# Patient Record
Sex: Male | Born: 1960 | Race: Black or African American | Hispanic: No | Smoking: Current every day smoker
Health system: Southern US, Community
[De-identification: ages and names within clinical notes are randomized; demographics above are authoritative.]

## PROBLEM LIST (undated history)

## (undated) DIAGNOSIS — F259 Schizoaffective disorder, unspecified: Secondary | ICD-10-CM

## (undated) DIAGNOSIS — R569 Unspecified convulsions: Secondary | ICD-10-CM

## (undated) DIAGNOSIS — I10 Essential (primary) hypertension: Secondary | ICD-10-CM

## (undated) DIAGNOSIS — J45909 Unspecified asthma, uncomplicated: Secondary | ICD-10-CM

## (undated) DIAGNOSIS — Z5189 Encounter for other specified aftercare: Secondary | ICD-10-CM

## (undated) DIAGNOSIS — M1990 Unspecified osteoarthritis, unspecified site: Secondary | ICD-10-CM

## (undated) DIAGNOSIS — E039 Hypothyroidism, unspecified: Secondary | ICD-10-CM

## (undated) DIAGNOSIS — Z87891 Personal history of nicotine dependence: Principal | ICD-10-CM

## (undated) DIAGNOSIS — E119 Type 2 diabetes mellitus without complications: Secondary | ICD-10-CM

## (undated) HISTORY — DX: Personal history of nicotine dependence: Z87.891

---

## 2005-04-14 ENCOUNTER — Emergency Department: Payer: Self-pay | Admitting: Emergency Medicine

## 2005-10-06 ENCOUNTER — Emergency Department: Payer: Self-pay | Admitting: Emergency Medicine

## 2007-11-16 ENCOUNTER — Emergency Department: Payer: Self-pay | Admitting: Internal Medicine

## 2007-11-16 DIAGNOSIS — E785 Hyperlipidemia, unspecified: Secondary | ICD-10-CM | POA: Insufficient documentation

## 2007-11-16 DIAGNOSIS — E119 Type 2 diabetes mellitus without complications: Secondary | ICD-10-CM

## 2007-11-16 DIAGNOSIS — I10 Essential (primary) hypertension: Secondary | ICD-10-CM | POA: Insufficient documentation

## 2007-11-30 ENCOUNTER — Emergency Department: Payer: Self-pay | Admitting: Emergency Medicine

## 2007-12-03 DIAGNOSIS — K649 Unspecified hemorrhoids: Secondary | ICD-10-CM | POA: Insufficient documentation

## 2008-09-28 ENCOUNTER — Inpatient Hospital Stay: Payer: Self-pay | Admitting: Internal Medicine

## 2008-10-03 ENCOUNTER — Emergency Department: Payer: Self-pay | Admitting: Internal Medicine

## 2008-10-11 ENCOUNTER — Ambulatory Visit: Payer: Self-pay | Admitting: Family Medicine

## 2008-10-11 IMAGING — CT CT HEAD WITHOUT CONTRAST
2 series · 15 of 30 positions shown, 19 images · non-contrast
Comparison: none

REASON FOR EXAM: altered mental status    eval for TIA or CVA
COMMENTS:

[Series 2: without · axial · non-contrast · 0.39mm/px · z∈[+248,+368]mm · 13 of 30 slices shown, 17 images]
[im 3/30  brain]
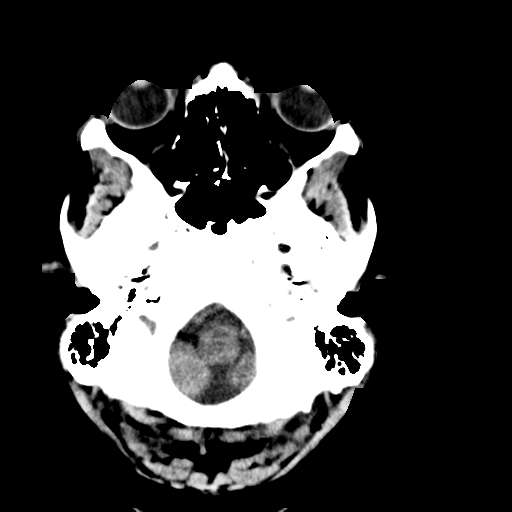
[im 3/30  bone]
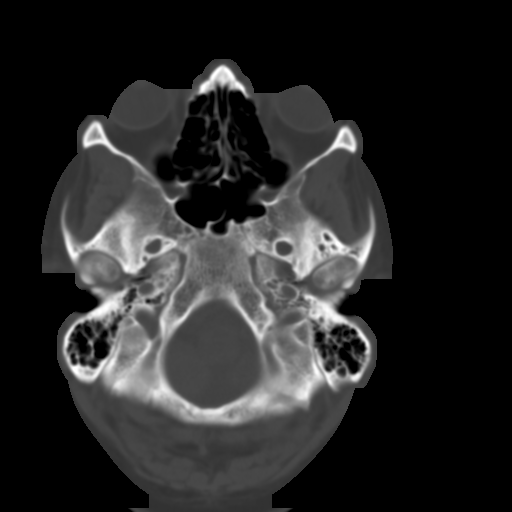
[im 5/30  brain]
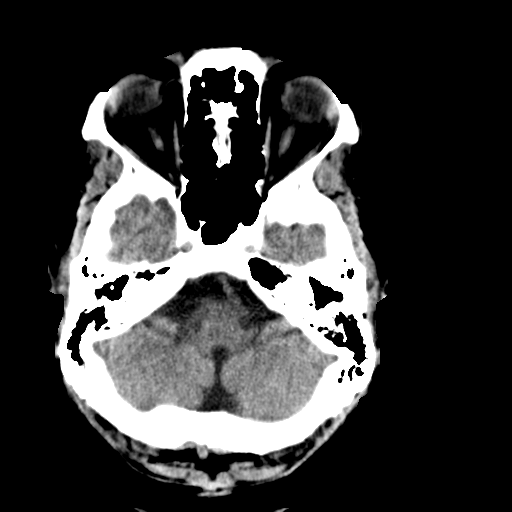
[im 7/30  brain]
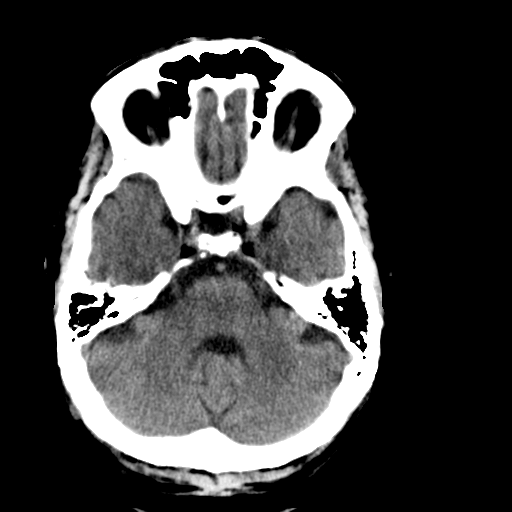
[im 9/30  brain]
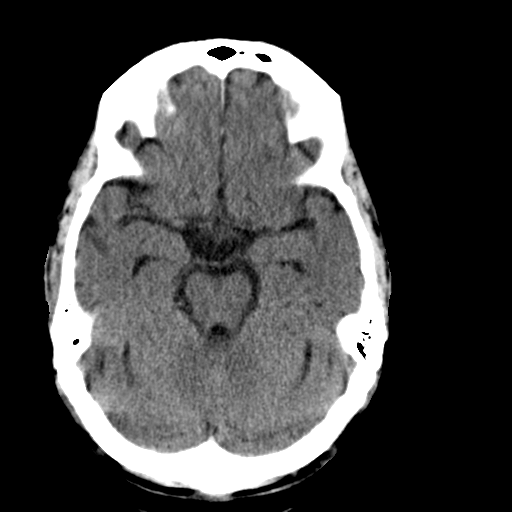
[im 11/30  brain]
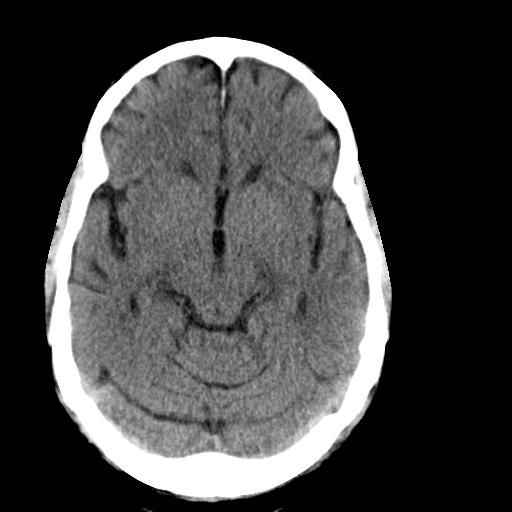
[im 11/30  bone]
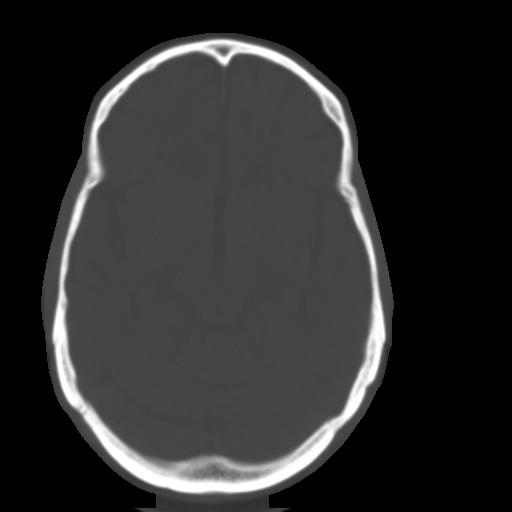
[im 13/30  brain]
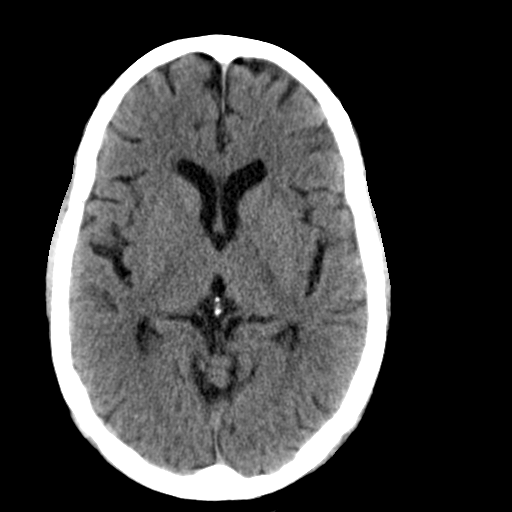
[im 15/30  brain]
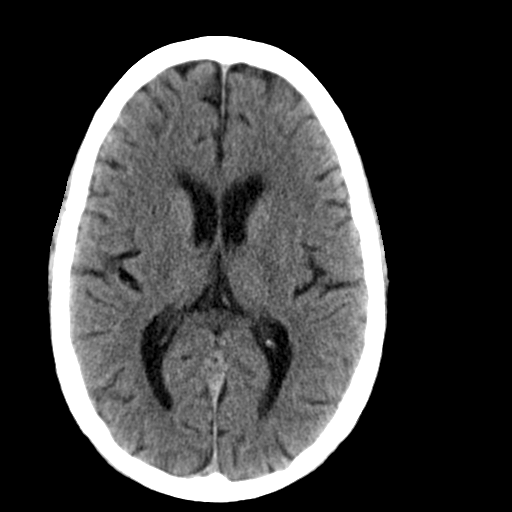
[im 17/30  brain]
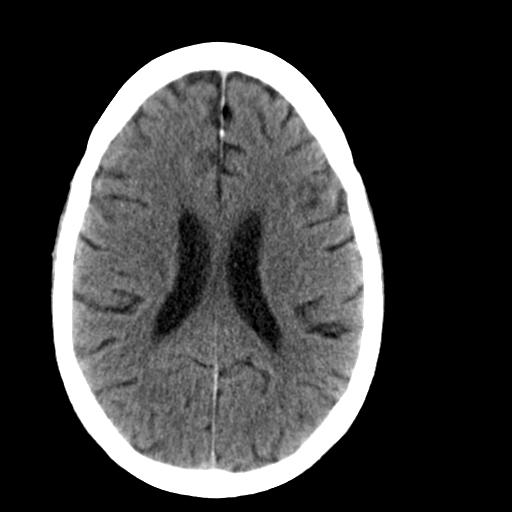
[im 19/30  brain]
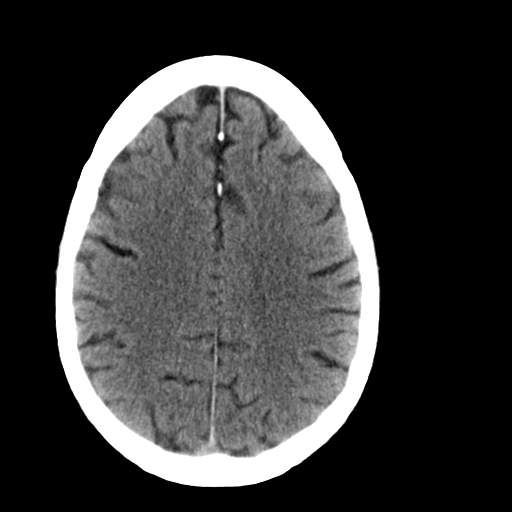
[im 19/30  bone]
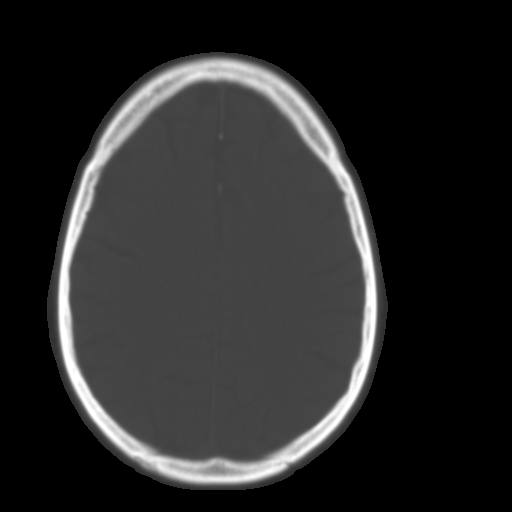
[im 21/30  brain]
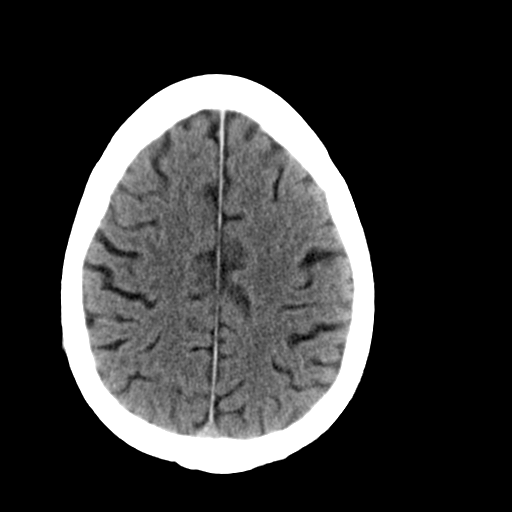
[im 23/30  brain]
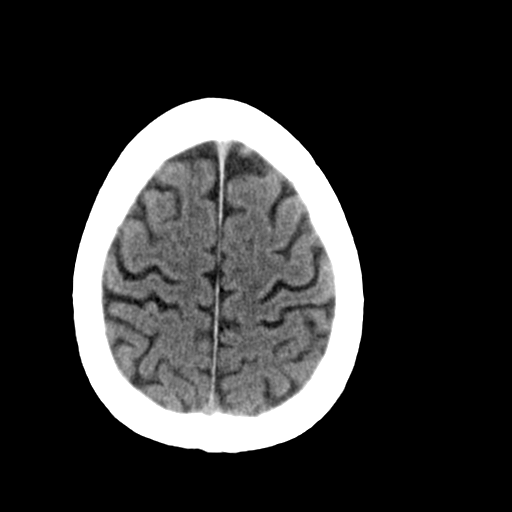
[im 25/30  brain]
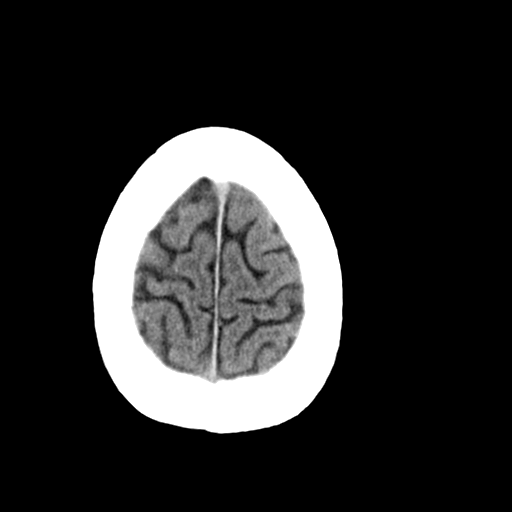
[im 27/30  brain]
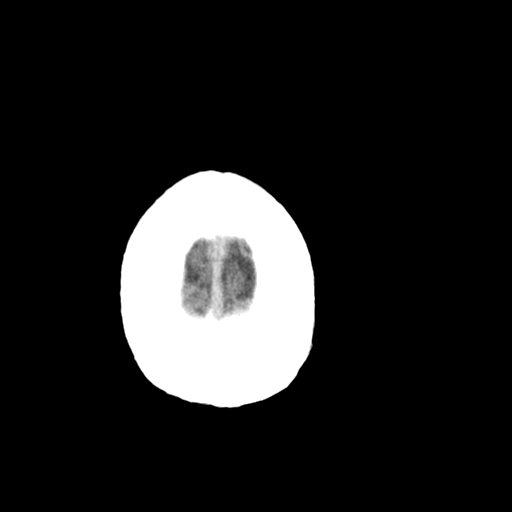
[im 27/30  bone]
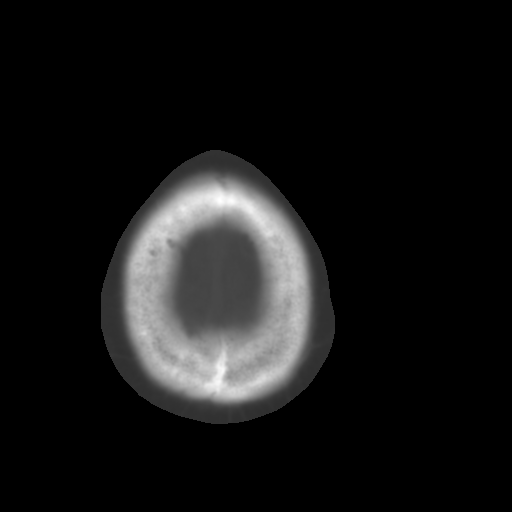

[Series 3: bone · axial · 0.39mm/px · z∈[+248,+268]mm · 2 of 30 slices shown]
[im 3/30  bone]
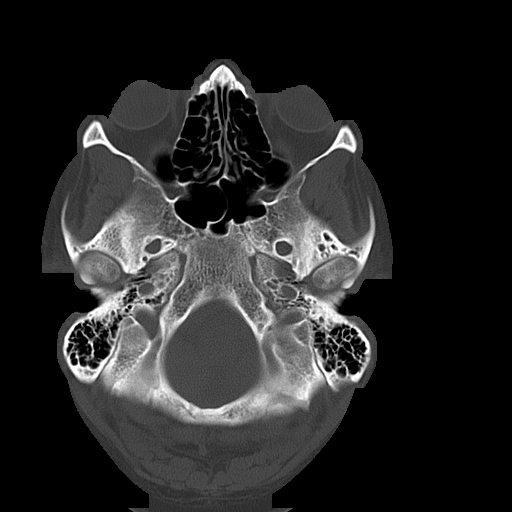
[im 7/30  bone]
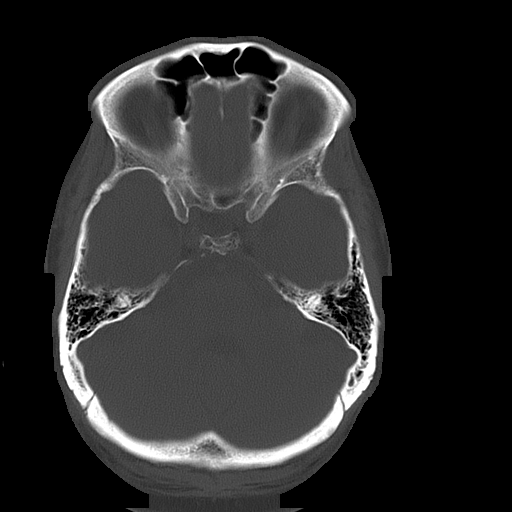

[15 of 30 positions shown; findings below may reference images not displayed]

PROCEDURE:     CT  - CT HEAD WITHOUT CONTRAST  - [DATE]  [DATE]

RESULT:     The patient is experiencing altered mental status. Axial imaging
was performed through the brain without IV contrast at 5 mm intervals and
slice thicknesses.

The ventricles are normal in size and position. There is no intracranial
hemorrhage, mass, or mass effect. There is subtle hypodensity in the
subcortical gray matter of the LEFT posterior frontal lobe demonstrated best
on image 17. This extends into the adjacent deep white matter. The
appearance is not classic for acute ischemia. I do not see similar findings
elsewhere. The cerebellum and brainstem exhibit normal density. At bone
window settings, the observed portions of the paranasal sinuses are clear.
IMPRESSION: 1. I do not see evidence of an acute ischemic or hemorrhagic event. Yet,
there is subtle hypodensity in the posterior LEFT frontal lobe demonstrated
best on image 17 and to a lesser extent on image 16. This could reflect a
subacute or old process. It cannot be better characterized on the current
study. Followup MRI with gadolinium would be useful.
2. I do not see evidence of hydrocephalus nor definite evidence of
intracranial mass effect.

## 2008-11-15 ENCOUNTER — Ambulatory Visit: Payer: Self-pay | Admitting: Gastroenterology

## 2008-11-17 ENCOUNTER — Ambulatory Visit: Payer: Self-pay | Admitting: Neurology

## 2009-05-02 ENCOUNTER — Ambulatory Visit: Payer: Self-pay | Admitting: Internal Medicine

## 2009-06-01 ENCOUNTER — Ambulatory Visit: Payer: Self-pay | Admitting: Internal Medicine

## 2009-08-01 ENCOUNTER — Ambulatory Visit: Payer: Self-pay | Admitting: Internal Medicine

## 2009-08-10 ENCOUNTER — Ambulatory Visit: Payer: Self-pay | Admitting: Internal Medicine

## 2010-01-30 ENCOUNTER — Ambulatory Visit: Payer: Self-pay | Admitting: Internal Medicine

## 2010-02-06 ENCOUNTER — Ambulatory Visit: Payer: Self-pay | Admitting: Internal Medicine

## 2010-03-01 ENCOUNTER — Ambulatory Visit: Payer: Self-pay | Admitting: Internal Medicine

## 2010-04-01 ENCOUNTER — Ambulatory Visit: Payer: Self-pay | Admitting: Internal Medicine

## 2011-01-10 ENCOUNTER — Emergency Department: Payer: Self-pay | Admitting: Emergency Medicine

## 2011-01-11 ENCOUNTER — Emergency Department: Payer: Self-pay | Admitting: Unknown Physician Specialty

## 2011-01-23 ENCOUNTER — Ambulatory Visit: Payer: Self-pay | Admitting: Family Medicine

## 2012-04-01 ENCOUNTER — Ambulatory Visit: Payer: Self-pay | Admitting: Internal Medicine

## 2012-04-23 ENCOUNTER — Ambulatory Visit: Payer: Self-pay | Admitting: Internal Medicine

## 2012-04-23 LAB — CBC CANCER CENTER
Basophil %: 0.8 %
Eosinophil %: 2.2 %
HCT: 41 % (ref 40.0–52.0)
Lymphocyte #: 1.1 x10 3/mm (ref 1.0–3.6)
Lymphocyte %: 33.4 %
MCH: 30.6 pg (ref 26.0–34.0)
MCV: 92 fL (ref 80–100)
Neutrophil %: 47.4 %
Platelet: 150 x10 3/mm (ref 150–440)
RBC: 4.45 10*6/uL (ref 4.40–5.90)
RDW: 13.7 % (ref 11.5–14.5)
WBC: 3.4 x10 3/mm — ABNORMAL LOW (ref 3.8–10.6)

## 2012-05-02 ENCOUNTER — Ambulatory Visit: Payer: Self-pay | Admitting: Internal Medicine

## 2013-06-08 ENCOUNTER — Emergency Department: Payer: Self-pay | Admitting: Internal Medicine

## 2013-09-12 DIAGNOSIS — N182 Chronic kidney disease, stage 2 (mild): Secondary | ICD-10-CM

## 2013-09-12 HISTORY — DX: Chronic kidney disease, stage 2 (mild): N18.2

## 2014-05-02 ENCOUNTER — Emergency Department: Payer: Self-pay | Admitting: Emergency Medicine

## 2014-05-02 LAB — CBC
HCT: 44 % (ref 40.0–52.0)
HGB: 14.8 g/dL (ref 13.0–18.0)
MCH: 29.8 pg (ref 26.0–34.0)
MCHC: 33.7 g/dL (ref 32.0–36.0)
MCV: 89 fL (ref 80–100)
Platelet: 138 10*3/uL — ABNORMAL LOW (ref 150–440)
RBC: 4.96 10*6/uL (ref 4.40–5.90)
RDW: 13.4 % (ref 11.5–14.5)
WBC: 3.8 10*3/uL (ref 3.8–10.6)

## 2014-05-02 LAB — URINALYSIS, COMPLETE
BACTERIA: NONE SEEN
Bilirubin,UR: NEGATIVE
Blood: NEGATIVE
Glucose,UR: >=500 mg/dL (ref 0–75)
Ketone: NEGATIVE
Leukocyte Esterase: NEGATIVE
NITRITE: NEGATIVE
PH: 6 (ref 4.5–8.0)
PROTEIN: NEGATIVE
SQUAMOUS EPITHELIAL: NONE SEEN
Specific Gravity: 1.018 (ref 1.003–1.030)

## 2014-05-02 LAB — DRUG SCREEN, URINE

## 2014-05-02 LAB — COMPREHENSIVE METABOLIC PANEL
ANION GAP: 5 — AB (ref 7–16)
AST: 22 U/L (ref 15–37)
Albumin: 3.2 g/dL — ABNORMAL LOW (ref 3.4–5.0)
Alkaline Phosphatase: 92 U/L
BUN: 14 mg/dL (ref 7–18)
Bilirubin,Total: 0.3 mg/dL (ref 0.2–1.0)
CO2: 29 mmol/L (ref 21–32)
Calcium, Total: 8.7 mg/dL (ref 8.5–10.1)
Chloride: 101 mmol/L (ref 98–107)
Creatinine: 1.35 mg/dL — ABNORMAL HIGH (ref 0.60–1.30)
EGFR (African American): >60
GFR CALC NON AF AMER: 59 — AB
Glucose: 351 mg/dL — ABNORMAL HIGH (ref 65–99)
Osmolality: 285 (ref 275–301)
Potassium: 4.5 mmol/L (ref 3.5–5.1)
SGPT (ALT): 15 U/L
SODIUM: 135 mmol/L — AB (ref 136–145)
Total Protein: 7.7 g/dL (ref 6.4–8.2)

## 2014-05-02 LAB — SALICYLATE LEVEL

## 2014-05-02 LAB — ETHANOL: Ethanol: <3 mg/dL

## 2014-05-02 LAB — ACETAMINOPHEN LEVEL: Acetaminophen: <2 ug/mL

## 2014-05-02 LAB — AMMONIA: AMMONIA, PLASMA: 61 umol/L — AB (ref 11–32)

## 2014-05-02 LAB — TSH: THYROID STIMULATING HORM: 2.93 u[IU]/mL

## 2014-09-19 ENCOUNTER — Inpatient Hospital Stay: Payer: Self-pay | Admitting: Psychiatry

## 2014-09-19 LAB — COMPREHENSIVE METABOLIC PANEL
ALK PHOS: 88 U/L
Albumin: 3.5 g/dL (ref 3.4–5.0)
Anion Gap: 3 — ABNORMAL LOW (ref 7–16)
BUN: 11 mg/dL (ref 7–18)
Bilirubin,Total: 0.3 mg/dL (ref 0.2–1.0)
CALCIUM: 9 mg/dL (ref 8.5–10.1)
Chloride: 105 mmol/L (ref 98–107)
Co2: 29 mmol/L (ref 21–32)
Creatinine: 1.26 mg/dL (ref 0.60–1.30)
EGFR (African American): >60
EGFR (Non-African Amer.): >60
Glucose: 138 mg/dL — ABNORMAL HIGH (ref 65–99)
Osmolality: 275 (ref 275–301)
Potassium: 4.3 mmol/L (ref 3.5–5.1)
SGOT(AST): 25 U/L (ref 15–37)
SGPT (ALT): 23 U/L
SODIUM: 137 mmol/L (ref 136–145)
TOTAL PROTEIN: 7.7 g/dL (ref 6.4–8.2)

## 2014-09-19 LAB — DRUG SCREEN, URINE
AMPHETAMINES, UR SCREEN: NEGATIVE (ref ?–1000)
BENZODIAZEPINE, UR SCRN: NEGATIVE (ref ?–200)
Barbiturates, Ur Screen: NEGATIVE (ref ?–200)
CANNABINOID 50 NG, UR ~~LOC~~: NEGATIVE (ref ?–50)
Cocaine Metabolite,Ur ~~LOC~~: NEGATIVE (ref ?–300)
MDMA (ECSTASY) UR SCREEN: NEGATIVE (ref ?–500)
Methadone, Ur Screen: NEGATIVE (ref ?–300)
OPIATE, UR SCREEN: NEGATIVE (ref ?–300)
PHENCYCLIDINE (PCP) UR S: NEGATIVE (ref ?–25)
Tricyclic, Ur Screen: NEGATIVE (ref ?–1000)

## 2014-09-19 LAB — CBC
HCT: 44.1 % (ref 40.0–52.0)
HGB: 15.1 g/dL (ref 13.0–18.0)
MCH: 29.9 pg (ref 26.0–34.0)
MCHC: 34.2 g/dL (ref 32.0–36.0)
MCV: 87 fL (ref 80–100)
Platelet: 228 10*3/uL (ref 150–440)
RBC: 5.04 10*6/uL (ref 4.40–5.90)
RDW: 12.8 % (ref 11.5–14.5)
WBC: 4 10*3/uL (ref 3.8–10.6)

## 2014-09-19 LAB — ACETAMINOPHEN LEVEL: Acetaminophen: <2 ug/mL

## 2014-09-19 LAB — TSH: Thyroid Stimulating Horm: 1.27 u[IU]/mL

## 2014-09-19 LAB — ETHANOL

## 2014-09-19 LAB — SALICYLATE LEVEL: Salicylates, Serum: 2.5 mg/dL

## 2014-09-21 LAB — LIPID PANEL
Cholesterol: 107 mg/dL (ref 0–200)
HDL: 23 mg/dL — AB (ref 40–60)
Ldl Cholesterol, Calc: 41 mg/dL (ref 0–100)
Triglycerides: 215 mg/dL — ABNORMAL HIGH (ref 0–200)
VLDL CHOLESTEROL, CALC: 43 mg/dL — AB (ref 5–40)

## 2014-09-21 LAB — VALPROIC ACID LEVEL: Valproic Acid: 92 ug/mL

## 2014-09-21 LAB — HEMOGLOBIN A1C: Hemoglobin A1C: 9.3 % — ABNORMAL HIGH (ref 4.2–6.3)

## 2014-12-23 NOTE — Consult Note (Signed)
PATIENT NAME:  Justin Weeks, [place] A MR#:  [id number] DATE OF BIRTH:  07-05-1961  DATE OF CONSULTATION:  05/03/2014  REFERRING PHYSICIAN:   CONSULTING PHYSICIAN:  Audery AmelJohn T. Magdiel Bartles, MD  IDENTIFYING INFORMATION AND REASON FOR CONSULTATION: This is a 54 year old man with a history of schizophrenia who was brought to the Emergency Room by his ACT team.   CHIEF COMPLAINT: "I've just been hearing some knocking on my door." The patient had made some statements about suicide apparently to ACT team yesterday or earlier today as well as talking about hearing this knocking, which is what prompted them to bring him.   HISTORY OF PRESENT ILLNESS: Information obtained from the patient and the chart. It is reported in the intake note that the patient said that he was hearing knocking on his door at home and that whenever he would go to it no one was there. He had made some vague statements about suicide, but had not specified any intent or plan. When I talked to him this afternoon, the patient says that his mood overall has been pretty good. He says that he sleeps fine. Says his appetite is fine. Says he is not really feeling depressed. He talks a lot about how his long-term goal is to eventually buy or rent a trailer and get a vehicle so that he can live more independently as he did in the past. He says that he has been hearing some knocking on his door every day and when he goes there nobody is there. He says that has been getting on his nerves, but he denies any homicidal ideation, denies any suicidal ideation. He says he is not hearing voices talk to him. He says his function overall has been pretty good. He takes a walk every day, cooks himself a meal, cooperates with the ACT team. He is happy to continue taking his psychiatric medicine. Denies any alcohol or drug abuse. Did not actually do anything to try and harm himself.   PAST PSYCHIATRIC HISTORY: Long history of schizophrenia. He has been to our Emergency Room  several times before, but I do not see that he has been admitted here. He denies any history of suicide attempts. He denies having been violent to other people. He is currently taking a long-acting injection as part of his medicine. He says they come by and see him every other week and give him a shot. From his list of medicine it looks like he is also still taking Depakote. They have Risperdal oral listed and they do not have the shot listed, it makes me suspect that maybe he is getting a Risperdal long-acting shot if it is every other week.  PAST MEDICAL HISTORY: He has diabetes for which he takes oral medicine. Does not use insulin. High blood pressure, dyslipidemia, hypothyroid.   FAMILY HISTORY: He says he does have a family history of mental illness.   SOCIAL HISTORY: Currently living in a supported apartment environment. He complains that it is too noisy there and there are people running around making disturbance all the time. He says he does not have much social life. Most of his family are passed away or do not communicate with him. His father still comes by and visits him occasionally. ACT team comes by every other week. Otherwise, his social activity is mostly sitting around the apartment or going for walks.   CURRENT MEDICATIONS: According to the intake note, he takes Januvia 100 mg once a day, simvastatin 20 mg a day, trihexyphenidyl  5 mg once a day, aspirin 81 mg a day, Risperdal 3 mg two of them at night, quinapril 5 mg a day, levothyroxine 50 mcg a day, metformin 1000 mg twice a day, Depakote 1000 mg in the morning and 500 at night, glipizide 10 mg twice a day, clonazepam 0.5 mg twice a day.   ALLERGIES: HALDOL, LITHIUM, PROLIXIN.   MENTAL STATUS EXAMINATION: This is an adequately groomed man, looks his stated age or older, cooperative and pleasant. Eye contact good. Psychomotor activity normal. Speech is a little bit slow and halting, but no obvious thought blocking. He has a somewhat  constricted affect but towards the end of the interview he smiles quite a bit, shakes my hand warmly, seems upbeat. Denies that he is currently feeling depressed. Says his mood is alright. Denies suicidal or homicidal ideation. Denies that he is aware of having hallucinations. He does not think the knocking on his door is a hallucination. Denies suicidal or homicidal ideation. He is alert and oriented x 4. Judgment and insight appear adequate. He says that he thinks he should just ignore the knocking and learn to cope with it and talk with his ACT team about his long-term plan. He can remember 3/3 objects immediately and at 2 minutes. Long-term memory appears to be generally intact.   REVIEW OF SYSTEMS: Denies suicidal or homicidal ideation. Denies any acute physical symptoms. Full 12-point review of systems otherwise negative.   ASSESSMENT: A 54 year old man with schizophrenia. I am not sure what might have been bugging him that prompted him to tell his ACT team that he was having suicidal thoughts, but since he has been here his affect has been calm and appropriate or even upbeat. He has not shown any dangerous behavior. He absolutely denies any suicidal or homicidal thought to me. He indicates that he has positive plans for the future. He gets quite excited when I told him that I thought that getting back into independent living in a trailer would be a reasonable long-term goal. At this point, there is no indication that he is acutely dangerous. I do not think he requires hospitalization or meets commitment criteria.   TREATMENT PLAN: No change to medication. Supportive and educational counseling done. As far as I can tell, it does look like potentially living more independently would be a reasonable longer term goal if he is able to live semi-independently right now. I informed him of that, which seemed to please him quite a bit. The patient will be released from the Emergency Room and will follow up with  the [facility] ACT team.   DIAGNOSIS, PRINCIPAL AND PRIMARY:  AXIS I: Schizophrenia, undifferentiated.   SECONDARY DIAGNOSES:  AXIS I: No further.  AXIS II: No diagnosis.  AXIS III: Diabetes, high blood pressure, hypothyroidism, dyslipidemia.  AXIS IV: Moderate to severe.  AXIS V: Functioning at time of evaluation 50.    ____________________________ Audery Amel, MD jtc:TT D: 05/03/2014 15:06:36 ET T: 05/03/2014 15:51:41 ET JOB#: [id number]  cc: Audery Amel, MD, <Dictator> Audery Amel MD ELECTRONICALLY SIGNED 05/12/2014 17:00

## 2014-12-31 NOTE — Consult Note (Signed)
PATIENT NAME:  Justin Weeks, Justin Weeks MR#:  [id number] DATE OF BIRTH:  02/23/1961  DATE OF CONSULTATION:  09/19/2014  REFERRING PHYSICIAN:  Rebecka Apley, MD  CONSULTING PHYSICIAN:  Ardeen Fillers. Garnetta Buddy, MD  REASON FOR CONSULTATION: "I am having suicidal thoughts and 2 months of chaos around my apartment."   HISTORY OF PRESENT ILLNESS: The patient is a 54 year old African American male with history of schizophrenia, presented to the ED with suicidal thoughts. He reported that he has been having suicidal thoughts for the past 3 weeks. He reported that he feels depressed and anxious. He currently denied having any homicidal ideations or intent. He reported that he has been having too much chaos around his apartment. He appeared confused and reported that his mind is racing all the time. He reported that he feels that he worries too much and has been having racing thoughts to run to [place]. He does not know anybody in [place], but he was [place]. He feels that his mind is racing, and he is racing himself to death. He feels that "somebody is telling me to go to [place]." The patient appeared anxious during the interview. He reported that he has been taking his medications prescribed by [facility] ACT team, and he gets an injection on a monthly basis and is due for the injection. He does not know the name of the injection at this time. The patient reported that he feels that he is very confused and is getting more agitated on the current psychotropic medications. He denied having any homicidal ideations or plans.   PAST PSYCHIATRIC HISTORY: The patient has long history of mental illness. He was diagnosed with schizophrenia and has been admitted in the past to an inpatient unit. He was previously seen in the ER in September. The patient is currently on the combination of Risperdal, Depakote, as well as on Risperdal decanoate.   PAST MEDICAL HISTORY: Diabetic, hypertension, dyslipidemia, and  hypothyroidism.   FAMILY HISTORY: Denied any family history of mental illness.   SOCIAL HISTORY: The patient currently lives in a supported apartment environment. He stated that he does not have any relationship with his family members. He is followed by the [facility] ACT team.   CURRENT MEDICATIONS: Januvia 100 mg once daily, simvastatin 20 mg daily, Haldol  5 mg daily, aspirin 81 mg daily, Risperdal 6 mg at bedtime, levothyroxine 50 mcg daily, metformin 1000 mg b.i.d., Depakote 500 mg in the morning and 1000 at bedtime, glipizide 10 mg daily, Lorazepam  0.5 mg daily.   ALLERGIES: HALDOL, LITHIUM, PROLIXIN.   MENTAL STATUS EXAMINATION: The patient is a thinly built male who appeared his stated age. He was pleasant and cooperative. He maintained fair eye contact. Some psychomotor retardation was noted. Speech was normal in tone and volume. He stated that he feels anxious. He admits to having suicidal ideations, but denied having any suicidal plans at this time. He has a plan of running away to [place]. He denied any homicidal ideations. He is awake, alert, and oriented x3. Demonstrated poor insight and judgment. His memory seems appropriate. Fund of knowledge seems fine.   REVIEW OF SYSTEMS:  CONSTITUTIONAL: Denies any fever or chills. No weight changes.  EYES: No double or blurred vision.  RESPIRATORY: No shortness of breath or cough.  CARDIOVASCULAR: Denies any chest pain or orthopnea.  GASTROINTESTINAL: No abdominal pain, nausea, vomiting, or diarrhea.  GENITOURINARY: No incontinence or frequency.  ENDOCRINE: No heat or cold intolerance.  LYMPHATIC: No  anemia or easy bruising.  INTEGUMENTARY: No acne or rash.   VITAL SIGNS: Temperature 97.6, pulse 90, respirations 20, blood pressure 108/87.    LABORATORY DATA: Glucose 138, BUN 11, creatinine 1.26, sodium 137, potassium 4.3, chloride 105, bicarbonate 29. Anion gap 3. Osmolality 275. Blood alcohol level less than 3., AST 25, ALT 23.  UDS is negative. WBC 4.0, RBC 5.04, hemoglobin 15.1, hematocrit 44.1 RDW is 12.8.   DIAGNOSTIC IMPRESSION: AXIS I: Schizophrenia, chronic, paranoid type.  AXIS II: None.  AXIS III: Please review the medical history.  AXIS IV: Severe. Current global assessment of functioning 25.   TREATMENT PLAN: 1. The patient will be admitted to the inpatient behavioral health unit for stabilization and safety.  2. He will be started back on his current psychotropic medications.  3. Medications will be reviewed by the treatment team and will be adjusted according to his needs.    Thank you for allowing me to participate in the care of this patient.    ____________________________ Ardeen FillersUzma S. Garnetta BuddyFaheem, MD usf:mw D: 09/19/2014 11:47:55 ET T: 09/19/2014 12:08:57 ET JOB#: [id number]  cc: Ardeen FillersUzma S. Garnetta BuddyFaheem, MD, <Dictator> Rhunette CroftUZMA S Antoninette Lerner MD ELECTRONICALLY SIGNED 09/19/2014 15:42

## 2014-12-31 NOTE — H&P (Signed)
PATIENT NAME:  Justin Weeks, Justin Weeks MR#:  [id number] DATE OF BIRTH:  October 28, 1960  DATE OF ADMISSION:  09/19/2014  REFERRING PHYSICIAN: Emergency Room MD.   ATTENDING PHYSICIAN: Kenyana Husak B. Jennet MaduroPucilowska, MD.   IDENTIFYING DATA: Justin Weeks is Weeks 54 year old male with history of schizophrenia.   CHIEF COMPLAINT: "I felt lonesome."   HISTORY OF PRESENT ILLNESS: Justin Weeks has Weeks long history of mental illness diagnosed with schizophrenia in the 1970s. He has been stable on his medications in the care of [facility] and living semi-independently. For the past 3 weeks, he started experiencing increased paranoia and he felt that that there were noises around his apartment, people knocking on his door, on his walls. He became more anxious. He started experiencing auditory hallucinations, voices telling him to move to [place] for no good reason. He has no connection to [place] whatsoever. He also started experiencing some symptoms of depression with decreased mood, poor sleep, some loss of appetite, anhedonia, Weeks feeling of hopelessness, low energy and concentration. He was not able to maintain his daily routine. He gets up at 4:30, takes his medications, eats breakfast, puts clothes on, and starts exercising by walking. He goes to [employer] to maintain his motivation. He cooks his own meals and takes excellent care of his apartment. This has become increasingly difficult in the past 3 weeks or so. He eventually came to the hospital complaining of thoughts of hurting himself. He denies any symptoms suggestive of bipolar mania. Denies alcohol or illicit substance use. He reports good compliance with medications. He gets bubble pack every month from his ACT team and every 2 weeks Weeks Risperdal Consta injection.   PAST PSYCHIATRIC HISTORY: Diagnosed in the 621970s. [facility], [facility]. There may be one more hospitalization more recently, but anyways it is  Weeks long time ago. Denies substance abuse.   FAMILY PSYCHIATRIC HISTORY: None reported.   PAST MEDICAL HISTORY: Dyslipidemia, diabetes, hypothyroidism, hypertension.   ALLERGIES: HALDOL, LITHIUM, PROLIXIN.   MEDICATIONS ON ADMISSION: Aspirin 81 mg daily, Klonopin 0.5 mg twice daily, Depakote 1000 mg in the morning 500 at bedtime, glipizide 10 mg twice daily, Synthroid 50 mcg daily, metformin 1000 mg twice daily, Quinapril 5 mg daily, Risperdal 6 mg at bedtime, Zocor 20 mg at bedtime, Januvia 5 mg daily, Risperdal Consta 50 mg every 2 weeks last injection given 09/06/2014.   SOCIAL HISTORY: Went to 11th grade. He now lives independently. He works with ACT team. He has Weeks [employer]ee. His mother passed away years ago. His father, who I heard is Weeks mean man, has been visiting with patient. It is quite possible that contact with the father precipitated the current hospitalization as he is unpleasant, demeaning, and is trying to get money from the patient.   REVIEW OF SYSTEMS:  CONSTITUTIONAL: No fevers or chills. No weight changes.  EYES: No double or blurred vision.  ENT: No hearing loss.  RESPIRATORY: No shortness of breath or cough.  CARDIOVASCULAR: No chest pain or orthopnea.  GASTROINTESTINAL: No abdominal pain, nausea, vomiting, or diarrhea.  GENITOURINARY: No incontinence or frequency.  ENDOCRINE: No heat or cold intolerance.  LYMPHATIC: No anemia or easy bruising.  INTEGUMENTARY: No acne or rash.  MUSCULOSKELETAL: No muscle or joint pain.  NEUROLOGIC: No tingling or weakness.  PSYCHIATRIC: See history of present illness for details.   PHYSICAL EXAMINATION: VITAL SIGNS: Blood pressure 114/78, pulse 76, respirations 20, temperature 98.3.  GENERAL: This  is Weeks well-developed, middle-aged male in no acute distress.  HEENT: The pupils are equal, round, and reactive to light. Sclerae are anicteric.  NECK: Supple. No thyromegaly.  LUNGS: Clear to auscultation. No dullness to percussion.  HEART:  Regular rhythm and rate. No murmurs, rubs, or gallops.  ABDOMEN: Soft, nontender, nondistended. Positive bowel sounds.  MUSCULOSKELETAL: Normal muscle strength in all extremities.  SKIN: No rashes or bruises.  LYMPHATIC: No cervical adenopathy.  NEUROLOGIC: Cranial nerves II through XII are intact.   LABORATORY DATA: Chemistries are within normal limits except for blood glucose of 138. Blood alcohol level is 0. LFTs are within normal limits. TSH 1.27. Urine toxicology screen negative for substances. CBC within normal limits. Serum acetaminophen and salicylates are low.   MENTAL STATUS EXAMINATION ON ADMISSION: The patient is alert and oriented to person, place, time, and situation. He is pleasant, polite, and cooperative. He is well groomed and casually dressed. He maintains good eye contact. His speech is soft. Mood is depressed with flat affect. Thought process is logical and goal oriented. Thought content: He denies thoughts of hurting himself or others now, but came to the hospital depressed and suicidal for the past 3 weeks. He is paranoid and delusional, although he feels safe in the hospital. He did have auditory hallucinations while still in his apartment. He denies it now. His cognition is grossly intact. Registration, recall, short and long-term memory are intact. He is of average intelligence and fund of knowledge. His insight and judgment are fair.   SUICIDE RISK ASSESSMENT ON ADMISSION: This is Weeks patient with Weeks long history of schizophrenia with possible exacerbation of psychotic symptoms with some hallucinations and delusional content and also worsening of mood and suicidal ideation.   INITIAL DIAGNOSES:  AXIS I: Schizophrenia.  AXIS II: Deferred.  AXIS III: Diabetes, hypertension, dyslipidemia.   PLAN: The patient was admitted to [facility] Medicine Unit for safety, stabilization, and medication management.  1.  Suicidal ideation: The patient is able  to contract for safety in the hospital.  2.  Schizophrenia. We will continue outpatient medication and we will give Risperdal Consta injection today as he is due for it. Will continue Risperdal at bedtime.  3.  Mood and anxiety. He is on Klonopin. We will consider adding an antidepressant to his regimen eventually.  4.  Diabetes. We will continue Januvia, glipizide, and metformin with Accu-Cheks.  5.  Hypertension. We will continue Quinapril.  6.  Dyslipidemia. We will continue Lipitor.     DISPOSITION: He will return to his apartment. He will follow up with ACT team. I spoke with ACT team representative already. They are surprised with his Emergency Room visit. The patient was in the emergency room in September with similar symptoms, but was not admitted to the hospital then. They have regular contact with the patient every Monday and every 2 weeks for injection; however, this Monday was [employer] day. I wonder if this disrupted the regular scheduling.    ____________________________ Ellin Goodie. Jennet Maduro, MD jbp:at D: 09/20/2014 15:36:50 ET T: 09/20/2014 16:35:34 ET JOB#: [id number]  cc: Arius Harnois B. Jennet Maduro, MD, <Dictator> Shari Prows MD ELECTRONICALLY SIGNED 10/15/2014 21:39

## 2015-04-19 ENCOUNTER — Emergency Department
Admission: EM | Admit: 2015-04-19 | Discharge: 2015-04-20 | Disposition: A | Discharge status: Home / Self Care | Payer: Medicare Other | Attending: Emergency Medicine | Admitting: Emergency Medicine

## 2015-04-19 ENCOUNTER — Encounter: Payer: Self-pay | Admitting: Emergency Medicine

## 2015-04-19 DIAGNOSIS — F32A Depression, unspecified: Secondary | ICD-10-CM

## 2015-04-19 DIAGNOSIS — R45851 Suicidal ideations: Secondary | ICD-10-CM | POA: Diagnosis present

## 2015-04-19 DIAGNOSIS — F203 Undifferentiated schizophrenia: Secondary | ICD-10-CM | POA: Diagnosis not present

## 2015-04-19 DIAGNOSIS — Z720 Tobacco use: Secondary | ICD-10-CM | POA: Diagnosis not present

## 2015-04-19 DIAGNOSIS — F329 Major depressive disorder, single episode, unspecified: Secondary | ICD-10-CM | POA: Diagnosis not present

## 2015-04-19 DIAGNOSIS — E119 Type 2 diabetes mellitus without complications: Secondary | ICD-10-CM

## 2015-04-19 HISTORY — DX: Unspecified osteoarthritis, unspecified site: M19.90

## 2015-04-19 HISTORY — DX: Unspecified asthma, uncomplicated: J45.909

## 2015-04-19 HISTORY — DX: Unspecified convulsions: R56.9

## 2015-04-19 HISTORY — DX: Essential (primary) hypertension: I10

## 2015-04-19 HISTORY — DX: Encounter for other specified aftercare: Z51.89

## 2015-04-19 HISTORY — DX: Type 2 diabetes mellitus without complications: E11.9

## 2015-04-19 NOTE — ED Notes (Signed)
Pt has cough, but airways clear.

## 2015-04-19 NOTE — ED Notes (Signed)
Pt has not attempted suicide; rather he states that he brought himself voluntarily because of "suicide thoughts."

## 2015-04-19 NOTE — ED Notes (Signed)
BEHAVIORAL HEALTH ROUNDING Patient sleeping: No. Patient alert and oriented: alert; not oriented Behavior appropriate: Yes.  ; If no, describe:  Nutrition and fluids offered: Yes  Toileting and hygiene offered: Yes  Sitter present: not applicable Law enforcement present: Yes  

## 2015-04-19 NOTE — ED Notes (Signed)
Pt states that he checked himself in because of "suicidal thoughts."  RN asked if he intended to do anything to himself now that he was in the ED and he said he didn't.  Pt changed into scrubs and belongings were put into pt belongings bag.

## 2015-04-19 NOTE — ED Notes (Signed)
Pt is not currently at risk for suicide and does not need a one-to-one sitter, per MD.

## 2015-04-19 NOTE — ED Notes (Signed)

## 2015-04-19 NOTE — ED Provider Notes (Signed)
[facility] Emergency Department Provider Note   ____________________________________________  Time seen: 2135  I have reviewed the triage vital signs and the nursing notes.   HISTORY  Chief Complaint Depression  History limited by: Not Limited   HPI Justin Weeks is a 54 y.o. male who presents to the emergency department today because of concerns for depression and suicidal thoughts. Patient states that he has been having these thoughts for 3 years. States that is ever since his mother died. He states that these thoughts come and go. Patient denies any specific plan. Patient did present to the emergency department voluntarily. No past medical history on file.  There are no active problems to display for this patient.   No past surgical history on file.  No current outpatient prescriptions on file.  Allergies Review of patient's allergies indicates not on file.  No family history on file.  Social History Social History  Substance Use Topics  . Smoking status: Current Every Day Smoker -- 1.00 packs/day for 40 years  . Smokeless tobacco: Not on file  . Alcohol Use: No    Review of Systems  Constitutional: Negative for fever. Cardiovascular: Negative for chest pain. Respiratory: Negative for shortness of breath. Gastrointestinal: Negative for abdominal pain, vomiting and diarrhea. Genitourinary: Negative for dysuria. Musculoskeletal: Negative for back pain. Skin: Negative for rash. Neurological: Negative for headaches, focal weakness or numbness.  10-point ROS otherwise negative.  ____________________________________________   PHYSICAL EXAM:  VITAL SIGNS:    97.8 F (36.6 C)  85  --  129/86 mmHg  97 %     Constitutional: Alert and oriented. and appears mildly depressed Eyes: Conjunctivae are normal. PERRL. Normal extraocular movements. ENT   Head: Normocephalic and atraumatic.   Nose: No congestion/rhinnorhea.    Mouth/Throat: Mucous membranes are moist.   Neck: No stridor. Hematological/Lymphatic/Immunilogical: No cervical lymphadenopathy. Cardiovascular: Normal rate, regular rhythm.  No murmurs, rubs, or gallops. Respiratory: Normal respiratory effort without tachypnea nor retractions. Breath sounds are clear and equal bilaterally. No wheezes/rales/rhonchi. Gastrointestinal: Soft and nontender. No distention.  Genitourinary: Deferred Musculoskeletal: Normal range of motion in all extremities. No joint effusions.  No lower extremity tenderness nor edema. Neurologic:  Normal speech and language. No gross focal neurologic deficits are appreciated. Speech is normal.  Skin:  Skin is warm, dry and intact. No rash noted. Psychiatric: depressed  ____________________________________________    LABS (pertinent positives/negatives)  Pending  ____________________________________________   EKG  None  ____________________________________________    RADIOLOGY  None  ____________________________________________   PROCEDURES  Procedure(s) performed: None  Critical Care performed: No  ____________________________________________   INITIAL IMPRESSION / ASSESSMENT AND PLAN / ED COURSE  Pertinent labs & imaging results that were available during my care of the patient were reviewed by me and considered in my medical decision making (see chart for details).  Patient presents to the emergency department today because of concerns for depression some intermittent suicidal thoughts. At this point patient does not appear to have any plan or distinct thought of suicidal ideation. Do not feel patient requires IVC at this point. Will have psychiatry see patient.  ____________________________________________   FINAL CLINICAL IMPRESSION(S) / ED DIAGNOSES  Depression  Phineas Semen, MD 04/20/15 [phone]

## 2015-04-19 NOTE — ED Notes (Signed)
Pt is belligerent and loud, although not threatening or physically acting-out.  Pt refuses to have a "blood transfusion" because he says that no one is going to lose any blood.  Attempts to explain to pt were cut-off by pt.  Pt did not want to discuss health care power of attorney. He rambled about his siblings and they were not to become involved in anything and the nerve of one to "take out $10,000" on him.  Pt demands his medication that pt bought with him to hospital but RN explained that medications were in the pharmacy.  Pt denies any alcohol or drug abuse.  Acknowledges pack a day smoker.

## 2015-04-20 ENCOUNTER — Encounter: Payer: Self-pay | Admitting: Emergency Medicine

## 2015-04-20 DIAGNOSIS — F329 Major depressive disorder, single episode, unspecified: Secondary | ICD-10-CM | POA: Diagnosis not present

## 2015-04-20 DIAGNOSIS — E119 Type 2 diabetes mellitus without complications: Secondary | ICD-10-CM

## 2015-04-20 DIAGNOSIS — F203 Undifferentiated schizophrenia: Secondary | ICD-10-CM | POA: Diagnosis not present

## 2015-04-20 LAB — URINALYSIS COMPLETE WITH MICROSCOPIC (ARMC ONLY)
BACTERIA UA: NONE SEEN
Bilirubin Urine: NEGATIVE
Glucose, UA: NEGATIVE mg/dL
HGB URINE DIPSTICK: NEGATIVE
KETONES UR: NEGATIVE mg/dL
LEUKOCYTES UA: NEGATIVE
NITRITE: NEGATIVE
PROTEIN: NEGATIVE mg/dL
SPECIFIC GRAVITY, URINE: 1.01 (ref 1.005–1.030)
Squamous Epithelial / LPF: NONE SEEN
WBC UA: NONE SEEN WBC/hpf (ref 0–5)
pH: 5 (ref 5.0–8.0)

## 2015-04-20 LAB — BASIC METABOLIC PANEL
Anion gap: 4 — ABNORMAL LOW (ref 5–15)
BUN: 11 mg/dL (ref 6–20)
CHLORIDE: 107 mmol/L (ref 101–111)
CO2: 28 mmol/L (ref 22–32)
CREATININE: 1.15 mg/dL (ref 0.61–1.24)
Calcium: 9.1 mg/dL (ref 8.9–10.3)
GFR calc Af Amer: >60 mL/min (ref >60)
GFR calc non Af Amer: >60 mL/min (ref >60)
GLUCOSE: 72 mg/dL (ref 65–99)
POTASSIUM: 3.9 mmol/L (ref 3.5–5.1)
Sodium: 139 mmol/L (ref 135–145)

## 2015-04-20 LAB — CBC WITH DIFFERENTIAL/PLATELET
Basophils Absolute: 0 10*3/uL (ref 0–0.1)
Basophils Relative: 1 %
EOS ABS: 0.2 10*3/uL (ref 0–0.7)
Eosinophils Relative: 4 %
HCT: 42.1 % (ref 40.0–52.0)
HEMOGLOBIN: 14.2 g/dL (ref 13.0–18.0)
LYMPHS ABS: 1.7 10*3/uL (ref 1.0–3.6)
LYMPHS PCT: 33 %
MCH: 27.8 pg (ref 26.0–34.0)
MCHC: 33.8 g/dL (ref 32.0–36.0)
MCV: 82.4 fL (ref 80.0–100.0)
MONOS PCT: 14 %
Monocytes Absolute: 0.7 10*3/uL (ref 0.2–1.0)
NEUTROS PCT: 48 %
Neutro Abs: 2.4 10*3/uL (ref 1.4–6.5)
Platelets: 216 10*3/uL (ref 150–440)
RBC: 5.11 MIL/uL (ref 4.40–5.90)
RDW: 13.5 % (ref 11.5–14.5)
WBC: 5 10*3/uL (ref 3.8–10.6)

## 2015-04-20 LAB — URINE DRUG SCREEN, QUALITATIVE (ARMC ONLY)
Amphetamines, Ur Screen: NOT DETECTED
BARBITURATES, UR SCREEN: NOT DETECTED
Benzodiazepine, Ur Scrn: NOT DETECTED
CANNABINOID 50 NG, UR ~~LOC~~: NOT DETECTED
Cocaine Metabolite,Ur ~~LOC~~: NOT DETECTED
MDMA (ECSTASY) UR SCREEN: NOT DETECTED
Methadone Scn, Ur: NOT DETECTED
Opiate, Ur Screen: NOT DETECTED
PHENCYCLIDINE (PCP) UR S: NOT DETECTED
Tricyclic, Ur Screen: NOT DETECTED

## 2015-04-20 LAB — SALICYLATE LEVEL: Salicylate Lvl: <4 mg/dL (ref 2.8–30.0)

## 2015-04-20 LAB — ETHANOL

## 2015-04-20 LAB — ACETAMINOPHEN LEVEL: Acetaminophen (Tylenol), Serum: <10 ug/mL — ABNORMAL LOW (ref 10–30)

## 2015-04-20 MED ORDER — SIMVASTATIN 20 MG PO TABS
20.0000 mg | ORAL_TABLET | Freq: Every evening | ORAL | Status: DC
  Filled 2015-04-20: qty 1

## 2015-04-20 MED ORDER — LISINOPRIL 5 MG PO TABS
5.0000 mg | ORAL_TABLET | Freq: Every day | ORAL | Status: DC
  Administered 2015-04-20: 5 mg via ORAL
  Filled 2015-04-20: qty 1

## 2015-04-20 MED ORDER — METFORMIN HCL ER 500 MG PO TB24
1000.0000 mg | ORAL_TABLET | Freq: Two times a day (BID) | ORAL | Status: DC
  Administered 2015-04-20: 1000 mg via ORAL
  Filled 2015-04-20 (×2): qty 2

## 2015-04-20 MED ORDER — METFORMIN HCL 500 MG PO TABS
ORAL_TABLET | ORAL | Status: AC
  Filled 2015-04-20: qty 2

## 2015-04-20 NOTE — ED Notes (Signed)
Justin Weeks [phone] called to notify staff that she is patient's [facility] Lead and to call if we need any info regarding the patient or assistance with patient's care.

## 2015-04-20 NOTE — ED Notes (Signed)
BEHAVIORAL HEALTH ROUNDING Patient sleeping: Yes.   Patient alert and oriented: not applicable Behavior appropriate: Yes.  ; If no, describe:  Nutrition and fluids offered: No Toileting and hygiene offered: No Sitter present: not applicable Law enforcement present: Yes  

## 2015-04-20 NOTE — ED Notes (Signed)
ED BHU PLACEMENT JUSTIFICATION Is the patient under IVC or is there intent for IVC: No. Is the patient medically cleared: Yes.   Is there vacancy in the ED BHU: Yes.   Is the population mix appropriate for patient: Yes.   Is the patient awaiting placement in inpatient or outpatient setting: Yes.   Has the patient had a psychiatric consult: Yes.   Survey of unit performed for contraband, proper placement and condition of furniture, tampering with fixtures in bathroom, shower, and each patient room: Yes.  ; Findings:  APPEARANCE/BEHAVIOR calm, cooperative and adequate rapport can be established NEURO ASSESSMENT Orientation: time, place and person Hallucinations: No.None noted (Hallucinations) Speech normal Gait: normal RESPIRATORY ASSESSMENT Normal expansion.  Clear to auscultation.  No rales, rhonchi, or wheezing. CARDIOVASCULAR ASSESSMENT regular rate and rhythm, S1, S2 normal, no murmur, click, rub or gallop GASTROINTESTINAL ASSESSMENT soft, nontender, BS WNL, no r/g EXTREMITIES normal strength, tone, and muscle mass PLAN OF CARE Provide calm/safe environment. Vital signs assessed twice daily. ED BHU Assessment once each 12-hour shift. Collaborate with intake RN daily or as condition indicates. Assure the ED provider has rounded once each shift. Provide and encourage hygiene. Provide redirection as needed. Assess for escalating behavior; address immediately and inform ED provider.  Assess family dynamic and appropriateness for visitation as needed: Yes.  ; If necessary, describe findings:  Educate the patient/family about BHU procedures/visitation: Yes.  ; If necessary, describe findings:

## 2015-04-20 NOTE — ED Notes (Signed)

## 2015-04-20 NOTE — ED Provider Notes (Signed)
-----------------------------------------   7:12 AM on 04/20/2015 -----------------------------------------   Blood pressure 129/86, pulse 85, temperature 97.8 F (36.6 C), temperature source Oral, SpO2 97 %.  The patient had no acute events since last update.  Calm and cooperative at this time.  Disposition is pending per Psychiatry/Behavioral Medicine team recommendations.     Sharyn Creamer, MD 04/20/15 [phone]

## 2015-04-20 NOTE — ED Notes (Signed)
BEHAVIORAL HEALTH ROUNDING Patient sleeping: Yes.   Patient alert and oriented: no Behavior appropriate: Yes.  ; If no, describe:  Nutrition and fluids offered: No Toileting and hygiene offered: No Sitter present: not applicable Law enforcement present: Yes  

## 2015-04-20 NOTE — ED Notes (Signed)
BEHAVIORAL HEALTH ROUNDING Patient sleeping: Yes.   Patient alert and oriented: not assessed, pt sleeping Behavior appropriate: Yes.  ; If no, describe: sleeping Nutrition and fluids offered: No, pt sleeping Toileting and hygiene offered: No, pt sleeping Sitter present: q42min checks Law enforcement present: Yes

## 2015-04-20 NOTE — ED Notes (Signed)
BEHAVIORAL HEALTH ROUNDING Patient sleeping: Yes.   Patient alert and oriented: sleeping Behavior appropriate: Yes.  ; If no, describe: sleeping Nutrition and fluids offered: Yes  Toileting and hygiene offered: Yes  Sitter present: q 15 min checks Law enforcement present: Yes  

## 2015-04-20 NOTE — ED Notes (Signed)
BEHAVIORAL HEALTH ROUNDING Patient sleeping: No. Patient alert and oriented: yes Behavior appropriate: Yes.  ; If no, describe:  Nutrition and fluids offered: Yes  Toileting and hygiene offered: Yes  Sitter present: not applicable Law enforcement present: Yes  

## 2015-04-20 NOTE — ED Notes (Signed)
Pt laying in bed.  

## 2015-04-20 NOTE — Discharge Instructions (Signed)

## 2015-04-20 NOTE — ED Notes (Signed)
Report received from Sarah RN. Patient care assumed. Patient/RN introduction complete. Will continue to monitor.  

## 2015-04-20 NOTE — ED Notes (Signed)
Pt continues to cough (chronic.  Water offered.  Pt declined.

## 2015-04-20 NOTE — ED Notes (Signed)
Pt. transfered to BHU without incident after report from. Placed in room and oriented to unit. Pt. informed that for their safety all care areas are designed for safety and monitored by security cameras at all times; and visiting hours explained to patient. Patient verbalizes understanding, and verbal contract for safety obtained.   

## 2015-04-20 NOTE — ED Notes (Signed)
Pt escorted to [facility] by RN.  Pt voluntary so no PO necessary.  Report to Raquel, Charity fundraiser.

## 2015-04-20 NOTE — ED Notes (Signed)
BEHAVIORAL HEALTH ROUNDING Patient sleeping: No. Patient alert and oriented: yes Behavior appropriate: Yes.  ; If no, describe:  Nutrition and fluids offered: Yes  Toileting and hygiene offered: Yes  Sitter present: q 15 min checks Law enforcement present: Yes  

## 2015-04-20 NOTE — BH Assessment (Signed)
Assessment Note  Justin Weeks is an 54 y.o. male presenting  to the ED voluntarily  because of concerns for depression and suicidal thoughts. Patient states that he has been having these thoughts for 3 years since his mother died. He states that these thoughts come and go. Patient denies any specific plan.   Pt reports that his current medications for depression are not working to relieve his symptoms and he feels that his dosage needs to be increased.  Pt could not remember what specific medication he currently prescribed.  Pt reports he's lonely and "need a good woman by his side".  Axis I: Depressive Disorder NOS Axis II: Deferred Axis III:  Past Medical History  Diagnosis Date  . Arthritis   . Asthma   . Blood transfusion without reported diagnosis   . Diabetes mellitus without complication   . Hypertension   . Seizures    Axis IV: problems with primary support group Axis V: 61-70 mild symptoms  Past Medical History:  Past Medical History  Diagnosis Date  . Arthritis   . Asthma   . Blood transfusion without reported diagnosis   . Diabetes mellitus without complication   . Hypertension   . Seizures     No past surgical history on file.  Family History: No family history on file.  Social History:  reports that he has been smoking.  He does not have any smokeless tobacco history on file. He reports that he does not drink alcohol. His drug history is not on file.  Additional Social History:  Alcohol / Drug Use History of alcohol / drug use?: No history of alcohol / drug abuse (Patient denies.)  CIWA: CIWA-Ar BP: 129/86 mmHg Pulse Rate: 85 COWS:    Allergies:  Allergies  Allergen Reactions  . Haldol [Haloperidol] Other (See Comments)    Pt states that he has "lockjaw" and he becomes very agitated.    Home Medications:  (Not in a hospital admission)  OB/GYN Status:  No LMP for male patient.  General Assessment Data Location of Assessment: [facility] ED TTS  Assessment: In system Is this a Tele or Face-to-Face Assessment?: Face-to-Face Is this an Initial Assessment or a Re-assessment for this encounter?: Initial Assessment Marital status: Single Maiden name: N/A Is patient pregnant?: No Pregnancy Status: No Living Arrangements: Alone Can pt return to current living arrangement?: Yes Admission Status: Voluntary Is patient capable of signing voluntary admission?: Yes Referral Source: Self/Family/Friend Insurance type: Medicare  Medical Screening Exam [facility] Walk-in ONLY) Medical Exam completed: Yes  Crisis Care Plan Living Arrangements: Alone Name of Psychiatrist: None reported Name of Therapist: None reported  Education Status Is patient currently in school?: No Current Grade: N/A  Risk to self with the past 6 months Suicidal Ideation: Yes-Currently Present Has patient been a risk to self within the past 6 months prior to admission? : No Suicidal Intent: No Has patient had any suicidal intent within the past 6 months prior to admission? : No Is patient at risk for suicide?: No Suicidal Plan?: No Has patient had any suicidal plan within the past 6 months prior to admission? : No Access to Means: No What has been your use of drugs/alcohol within the last 12 months?: None reported Previous Attempts/Gestures: No How many times?: 0 Other Self Harm Risks: None reported Triggers for Past Attempts: None known Intentional Self Injurious Behavior: None Family Suicide History: No Recent stressful life event(s): Loss (Comment) (Patient reports he's lonely) Persecutory voices/beliefs?: No Depression: Yes  Depression Symptoms: Feeling worthless/self pity Substance abuse history and/or treatment for substance abuse?: No Suicide prevention information given to non-admitted patients: Not applicable  Risk to Others within the past 6 months Homicidal Ideation: No Does patient have any lifetime risk of violence toward others beyond the six  months prior to admission? : No Thoughts of Harm to Others: No Current Homicidal Intent: No Current Homicidal Plan: No Access to Homicidal Means: No Identified Victim: N/A History of harm to others?: No Assessment of Violence: None Noted Violent Behavior Description: None reported Does patient have access to weapons?: No Criminal Charges Pending?: No Does patient have a court date: No Is patient on probation?: No  Psychosis Hallucinations: None noted Delusions: None noted  Mental Status Report Appearance/Hygiene: In scrubs Eye Contact: Good Motor Activity: Freedom of movement Speech: Logical/coherent, Loud Level of Consciousness: Alert Mood: Pleasant Affect: Appropriate to circumstance Anxiety Level: Minimal Thought Processes: Coherent, Circumstantial Judgement: Partial Orientation: Person, Place, Time, Situation, Appropriate for developmental age Obsessive Compulsive Thoughts/Behaviors: None  Cognitive Functioning Concentration: Normal Memory: Recent Intact IQ: Average Insight: Fair Impulse Control: Fair Appetite: Good Weight Loss: 0 Weight Gain: 0 Sleep: No Change Total Hours of Sleep: 8 Vegetative Symptoms: None  ADLScreening [facility] Assessment Services) Patient's cognitive ability adequate to safely complete daily activities?: Yes Patient able to express need for assistance with ADLs?: Yes Independently performs ADLs?: Yes (appropriate for developmental age)  Prior Inpatient Therapy Prior Inpatient Therapy: No  Prior Outpatient Therapy Prior Outpatient Therapy: No Does patient have an ACCT team?: No Does patient have Intensive In-House Services?  : No Does patient have [facility] services? : No Does patient have P4CC services?: No  ADL Screening (condition at time of admission) Patient's cognitive ability adequate to safely complete daily activities?: Yes Patient able to express need for assistance with ADLs?: Yes Independently performs ADLs?: Yes  (appropriate for developmental age)       Abuse/Neglect Assessment (Assessment to be complete while patient is alone) Physical Abuse: Denies Verbal Abuse: Denies Sexual Abuse: Denies Exploitation of patient/patient's resources: Denies Self-Neglect: Denies Values / Beliefs Cultural Requests During Hospitalization: None Spiritual Requests During Hospitalization: None Consults Spiritual Care Consult Needed: No Social Work Consult Needed: No Merchant navy officer (For Healthcare) Does patient have an advance directive?: No Would patient like information on creating an advanced directive?: No - patient declined information    Additional Information 1:1 In Past 12 Months?: No CIRT Risk: No Elopement Risk: No Does patient have medical clearance?: No     Disposition:  Disposition Initial Assessment Completed for this Encounter: Yes Disposition of Patient: Other dispositions, Referred to (Psych MD consult) Other disposition(s): Other (Comment) Patient referred to: Other (Comment) (Psych MD Consult)  On Site Evaluation by:   Reviewed with Physician:    Manus Rudd Alizeh Madril 04/20/2015 1:58 AM

## 2015-04-20 NOTE — ED Notes (Signed)
Patient discharged without receiving medications that were being held in the pharmacy.  Patient called at home x 3 without answer.  Left a message on answering machine notifying patient his medications were here to be picked up at his convenience.

## 2015-04-20 NOTE — ED Notes (Signed)
Pt continues to cough (chronic per pt).  Water offered.  Coughing wakes pt, but he returns to sleep.

## 2015-04-20 NOTE — Consult Note (Signed)
[facility] Consult   Reason for Consult:  This is a consult for this 54 year old man with a history of schizophrenia who came to the hospital voluntarily Referring Physician:  Quale Patient Identification: Justin Weeks MRN:  [id number] Principal Diagnosis: Schizophrenia Diagnosis:   Patient Active Problem List   Diagnosis Date Noted  . Schizophrenia [F20.9] 04/20/2015  . Diabetes [E11.9] 04/20/2015    Total Time spent with patient: 1 hour  Subjective:   Justin Weeks is a 54 y.o. male patient admitted with "I don't know why I'm here" see note below.  HPI:  This a 54 year old man with a history of schizophrenia who is followed by the [employer] act team. He called 911 himself yesterday apparently and asked them to bring him into the hospital. According to the intake notes he was making some statements about having suicidal thoughts when he came to the hospital but had not acted on it. When I see him today he says he doesn't remember what he was thinking at all. He totally denies being aware of any suicidal thoughts at any time at all. Says that his mood feels really good. He says that he has been following his usual routine which is to go to bed about 7 PM, wake up at 4:30, follow his usual routine around the house. He did mention however that he might have missed his medication and there is a note on the intake that suggests that maybe there were a couple days of his medicine that had not been taken. He had not been abusing drugs or alcohol. Patient currently is denying any active psychotic symptoms and denying any suicidal or homicidal thoughts whatsoever.  Past psychiatric history: This patient has a history of schizophrenia. He's had psychiatric admissions before but it's been a while although he had a brief admission in January of this year under similar circumstances. He denies that he's ever tried to harm himself in the past denies any history of violence. He is  aware that he has a diagnosis of schizophrenia. Is maintained on Depakote and Risperdal. Denies any history of substance abuse.  Medical history: Patient has diabetes and high blood pressure. Takes oral medication for the diabetes. Feels like he is keeping under pretty good control. Tries to be compliant with his medicine.  Substance abuse history: Says he doesn't drink or use any drugs and denies that it's ever been a problem in the past.  Social history: Patient lives independently and has act team support. They visit him every week and he has a person who comes in and helps him with household chores or do much daily. He has several members of his extended family still alive and has occasional contact with them but sounds like most of his social life is pretty isolated.  Family history: He denies knowing of any family history of mental health problems    Current medication: Lisinopril metformin and simvastatin Risperdal Depakote HPI Elements:   Quality:  Transiently agitated mood with alleged suicidal statements. Severity:  Moderate but severe at the time. Timing:  Transient yesterday. Duration:  Resolved now. Context:  Probably missed medicine for a day or 2.  Past Medical History:  Past Medical History  Diagnosis Date  . Arthritis   . Asthma   . Blood transfusion without reported diagnosis   . Diabetes mellitus without complication   . Hypertension   . Seizures    History reviewed. No pertinent past surgical history. Family History: History reviewed. No  pertinent family history. Social History:  History  Alcohol Use No     History  Drug Use Not on file    Social History   Social History  . Marital Status: Divorced    Spouse Name: N/A  . Number of Children: N/A  . Years of Education: N/A   Social History Main Topics  . Smoking status: Current Every Day Smoker -- 1.00 packs/day for 40 years  . Smokeless tobacco: None  . Alcohol Use: No  . Drug Use: None  . Sexual  Activity: Yes   Other Topics Concern  . None   Social History Narrative   Additional Social History:    History of alcohol / drug use?: No history of alcohol / drug abuse (Patient denies.)                     Allergies:   Allergies  Allergen Reactions  . Haldol [Haloperidol] Other (See Comments)    Pt states that he has "lockjaw" and he becomes very agitated.    Labs:  Results for orders placed or performed during the hospital encounter of 04/19/15 (from the past 48 hour(s))  CBC with Differential     Status: None   Collection Time: 04/20/15  1:22 AM  Result Value Ref Range   WBC 5.0 3.8 - 10.6 K/uL   RBC 5.11 4.40 - 5.90 MIL/uL   Hemoglobin 14.2 13.0 - 18.0 g/dL   HCT 42.1 40.0 - 52.0 %   MCV 82.4 80.0 - 100.0 fL   MCH 27.8 26.0 - 34.0 pg   MCHC 33.8 32.0 - 36.0 g/dL   RDW 13.5 11.5 - 14.5 %   Platelets 216 150 - 440 K/uL   Neutrophils Relative % 48 %   Neutro Abs 2.4 1.4 - 6.5 K/uL   Lymphocytes Relative 33 %   Lymphs Abs 1.7 1.0 - 3.6 K/uL   Monocytes Relative 14 %   Monocytes Absolute 0.7 0.2 - 1.0 K/uL   Eosinophils Relative 4 %   Eosinophils Absolute 0.2 0 - 0.7 K/uL   Basophils Relative 1 %   Basophils Absolute 0.0 0 - 0.1 K/uL  Basic metabolic panel     Status: Abnormal   Collection Time: 04/20/15  1:22 AM  Result Value Ref Range   Sodium 139 135 - 145 mmol/L   Potassium 3.9 3.5 - 5.1 mmol/L   Chloride 107 101 - 111 mmol/L   CO2 28 22 - 32 mmol/L   Glucose, Bld 72 65 - 99 mg/dL   BUN 11 6 - 20 mg/dL   Creatinine, Ser 1.15 0.61 - 1.24 mg/dL   Calcium 9.1 8.9 - 10.3 mg/dL   GFR calc non Af Amer >60 >60 mL/min   GFR calc Af Amer >60 >60 mL/min    Comment: (NOTE) The eGFR has been calculated using the CKD EPI equation. This calculation has not been validated in all clinical situations. eGFR's persistently <60 mL/min signify possible Chronic Kidney Disease.    Anion gap 4 (L) 5 - 15  Ethanol     Status: None   Collection Time: 04/20/15  1:22  AM  Result Value Ref Range   Alcohol, Ethyl (B) <5 <5 mg/dL    Comment:        LOWEST DETECTABLE LIMIT FOR SERUM ALCOHOL IS 5 mg/dL FOR MEDICAL PURPOSES ONLY   Salicylate level     Status: None   Collection Time: 04/20/15  1:22 AM  Result Value Ref  Range   Salicylate Lvl <6.5 2.8 - 30.0 mg/dL  Acetaminophen level     Status: Abnormal   Collection Time: 04/20/15  1:22 AM  Result Value Ref Range   Acetaminophen (Tylenol), Serum <10 (L) 10 - 30 ug/mL    Comment:        THERAPEUTIC CONCENTRATIONS VARY SIGNIFICANTLY. A RANGE OF 10-30 ug/mL MAY BE AN EFFECTIVE CONCENTRATION FOR MANY PATIENTS. HOWEVER, SOME ARE BEST TREATED AT CONCENTRATIONS OUTSIDE THIS RANGE. ACETAMINOPHEN CONCENTRATIONS >150 ug/mL AT 4 HOURS AFTER INGESTION AND >50 ug/mL AT 12 HOURS AFTER INGESTION ARE OFTEN ASSOCIATED WITH TOXIC REACTIONS.   Urinalysis complete, with microscopic ([facility] only)     Status: Abnormal   Collection Time: 04/20/15  1:36 AM  Result Value Ref Range   Color, Urine YELLOW (A) YELLOW   APPearance CLEAR (A) CLEAR   Glucose, UA NEGATIVE NEGATIVE mg/dL   Bilirubin Urine NEGATIVE NEGATIVE   Ketones, ur NEGATIVE NEGATIVE mg/dL   Specific Gravity, Urine 1.010 1.005 - 1.030   Hgb urine dipstick NEGATIVE NEGATIVE   pH 5.0 5.0 - 8.0   Protein, ur NEGATIVE NEGATIVE mg/dL   Nitrite NEGATIVE NEGATIVE   Leukocytes, UA NEGATIVE NEGATIVE   RBC / HPF 0-5 0 - 5 RBC/hpf   WBC, UA NONE SEEN 0 - 5 WBC/hpf   Bacteria, UA NONE SEEN NONE SEEN   Squamous Epithelial / LPF NONE SEEN NONE SEEN   Mucous PRESENT   Urine Drug Screen, Qualitative ([facility] only)     Status: None   Collection Time: 04/20/15  1:36 AM  Result Value Ref Range   Tricyclic, Ur Screen NONE DETECTED NONE DETECTED   Amphetamines, Ur Screen NONE DETECTED NONE DETECTED   MDMA (Ecstasy)Ur Screen NONE DETECTED NONE DETECTED   Cocaine Metabolite,Ur [place] NONE DETECTED NONE DETECTED   Opiate, Ur Screen NONE DETECTED NONE DETECTED    Phencyclidine (PCP) Ur S NONE DETECTED NONE DETECTED   Cannabinoid 50 Ng, Ur [place] NONE DETECTED NONE DETECTED   Barbiturates, Ur Screen NONE DETECTED NONE DETECTED   Benzodiazepine, Ur Scrn NONE DETECTED NONE DETECTED   Methadone Scn, Ur NONE DETECTED NONE DETECTED    Comment: (NOTE) 993  Tricyclics, urine               Cutoff 1000 ng/mL 200  Amphetamines, urine             Cutoff 1000 ng/mL 300  MDMA (Ecstasy), urine           Cutoff 500 ng/mL 400  Cocaine Metabolite, urine       Cutoff 300 ng/mL 500  Opiate, urine                   Cutoff 300 ng/mL 600  Phencyclidine (PCP), urine      Cutoff 25 ng/mL 700  Cannabinoid, urine              Cutoff 50 ng/mL 800  Barbiturates, urine             Cutoff 200 ng/mL 900  Benzodiazepine, urine           Cutoff 200 ng/mL 1000 Methadone, urine                Cutoff 300 ng/mL 1100 1200 The urine drug screen provides only a preliminary, unconfirmed 1300 analytical test result and should not be used for non-medical 1400 purposes. Clinical consideration and professional judgment should 1500 be applied to any positive drug screen result due  to possible 1600 interfering substances. A more specific alternate chemical method 1700 must be used in order to obtain a confirmed analytical result.  1800 Gas chromato graphy / mass spectrometry (GC/MS) is the preferred 1900 confirmatory method.     Vitals: Blood pressure 129/82, pulse 83, temperature 98.3 F (36.8 C), temperature source Oral, resp. rate 20, SpO2 100 %.  Risk to Self: Suicidal Ideation: Yes-Currently Present Suicidal Intent: No Is patient at risk for suicide?: No Suicidal Plan?: No Access to Means: No What has been your use of drugs/alcohol within the last 12 months?: None reported How many times?: 0 Other Self Harm Risks: None reported Triggers for Past Attempts: None known Intentional Self Injurious Behavior: None Risk to Others: Homicidal Ideation: No Thoughts of Harm to Others:  No Current Homicidal Intent: No Current Homicidal Plan: No Access to Homicidal Means: No Identified Victim: N/A History of harm to others?: No Assessment of Violence: None Noted Violent Behavior Description: None reported Does patient have access to weapons?: No Criminal Charges Pending?: No Does patient have a court date: No Prior Inpatient Therapy: Prior Inpatient Therapy: No Prior Outpatient Therapy: Prior Outpatient Therapy: No Does patient have an ACCT team?: No Does patient have Intensive In-House Services?  : No Does patient have [facility] services? : No Does patient have P4CC services?: No  Current Facility-Administered Medications  Medication Dose Route Frequency Provider Last Rate Last Dose  . lisinopril (PRINIVIL,ZESTRIL) tablet 5 mg  5 mg Oral Daily Delman Kitten, MD   5 mg at 04/20/15 1015  . metFORMIN (GLUCOPHAGE-XR) 24 hr tablet 1,000 mg  1,000 mg Oral BID Delman Kitten, MD   1,000 mg at 04/20/15 1016  . simvastatin (ZOCOR) tablet 20 mg  20 mg Oral QPM Delman Kitten, MD       Current Outpatient Prescriptions  Medication Sig Dispense Refill  . clonazePAM (KLONOPIN) 0.5 MG tablet Take 1 tablet by mouth 2 (two) times daily at 8 am and 10 pm.    . divalproex (DEPAKOTE ER) 500 MG 24 hr tablet Take 1-2 tablets by mouth daily. tk 2 qam and 1 qhs    . GLIPIZIDE XL 10 MG 24 hr tablet Take 1 tablet by mouth 2 (two) times daily.    Marland Kitchen JANUVIA 100 MG tablet Take 1 tablet by mouth daily.    Marland Kitchen levothyroxine (SYNTHROID, LEVOTHROID) 50 MCG tablet Take 1 tablet by mouth daily.    . metFORMIN (GLUCOPHAGE-XR) 500 MG 24 hr tablet Take 2 tablets by mouth 2 (two) times daily.    . quinapril (ACCUPRIL) 5 MG tablet Take 1 tablet by mouth daily.    . risperiDONE (RISPERDAL) 3 MG tablet Take 2 tablets by mouth at bedtime.    . simvastatin (ZOCOR) 20 MG tablet Take 1 tablet by mouth every evening.    . trihexyphenidyl (ARTANE) 5 MG tablet Take 5 mg by mouth daily.      Musculoskeletal: Strength &  Muscle Tone: within normal limits Gait & Station: normal Patient leans: N/A  Psychiatric Specialty Exam: Physical Exam  Constitutional: He appears well-developed and well-nourished.  HENT:  Head: Normocephalic and atraumatic.  Eyes: Conjunctivae are normal. Pupils are equal, round, and reactive to light.  Neck: Normal range of motion.  Cardiovascular: Normal heart sounds.   Respiratory: Effort normal.  GI: Soft.  Musculoskeletal: Normal range of motion.  Neurological: He is alert.  Skin: Skin is warm and dry.  Psychiatric: He has a normal mood and affect. His speech is normal and  behavior is normal. Thought content normal. He expresses impulsivity. He exhibits abnormal recent memory.  Patient is a slightly disheveled gentleman who looks his stated age or older. He was cooperative with the interview. He was rather expansive and his speech tone is loud but he does not appear to be really manic. Thoughts are a bit disorganized but not frankly bizarre. Upbeat affect. Totally denies suicidal ideation.    Review of Systems  Constitutional: Negative.   HENT: Negative.   Eyes: Negative.   Respiratory: Negative.   Cardiovascular: Negative.   Gastrointestinal: Negative.   Musculoskeletal: Negative.   Skin: Negative.   Neurological: Negative.   Psychiatric/Behavioral: Positive for memory loss. Negative for depression, suicidal ideas, hallucinations and substance abuse. The patient is not nervous/anxious and does not have insomnia.     Blood pressure 129/82, pulse 83, temperature 98.3 F (36.8 C), temperature source Oral, resp. rate 20, SpO2 100 %.There is no height or weight on file to calculate BMI.  General Appearance: Casual  Eye Contact::  Fair  Speech:  Clear and Coherent  Volume:  Increased  Mood:  Euphoric  Affect:  Congruent  Thought Process:  Tangential  Orientation:  Full (Time, Place, and Person)  Thought Content:  Negative  Suicidal Thoughts:  No  Homicidal Thoughts:  No   Memory:  Immediate;   Good Recent;   Fair Remote;   Fair  Judgement:  Impaired  Insight:  Lacking  Psychomotor Activity:  Normal  Concentration:  Good  Recall:  [employer] of Knowledge:Fair  Language: Fair  Akathisia:  No  Handed:  Right  AIMS (if indicated):     Assets:  Communication Skills Desire for Improvement Financial Resources/Insurance Housing Resilience Social Support  ADL's:  Intact  Cognition: WNL  Sleep:      Medical Decision Making: Review or order clinical lab tests (1), Established Problem, Worsening (2), Review or order medicine tests (1) and Review of Medication Regimen & Side Effects (2)  Treatment Plan Summary: Plan Patient is apparently stable right now. Does not appear to be suicidal or having any actively dangerous behavior or thoughts. Plan is for discharge back home. Act team notified. Continue current medicine. Supportive counseling and reinforced with him the importance of medication compliance. Case reviewed with emergency room physician.  Plan:  Patient does not meet criteria for psychiatric inpatient admission. Supportive therapy provided about ongoing stressors. Disposition: Discharge from emergency room with follow-up in the community  [place] 04/20/2015 3:46 PM

## 2015-04-20 NOTE — ED Provider Notes (Signed)
Patient cleared for discharge by psychiatry Dr. Toni Amend. Remains medically cleared  Jene Every, MD 04/20/15 [phone]

## 2015-04-20 NOTE — ED Notes (Signed)

## 2015-05-18 ENCOUNTER — Other Ambulatory Visit: Payer: Self-pay | Admitting: Family Medicine

## 2015-05-18 DIAGNOSIS — F17200 Nicotine dependence, unspecified, uncomplicated: Secondary | ICD-10-CM

## 2015-05-21 ENCOUNTER — Other Ambulatory Visit: Payer: Self-pay | Admitting: Family Medicine

## 2015-05-31 ENCOUNTER — Encounter: Payer: Self-pay | Admitting: Family Medicine

## 2015-05-31 ENCOUNTER — Other Ambulatory Visit: Payer: Self-pay | Admitting: Family Medicine

## 2015-05-31 DIAGNOSIS — Z87891 Personal history of nicotine dependence: Secondary | ICD-10-CM

## 2015-05-31 HISTORY — DX: Personal history of nicotine dependence: Z87.891

## 2015-06-01 ENCOUNTER — Encounter: Payer: Self-pay | Admitting: Family Medicine

## 2015-06-01 ENCOUNTER — Ambulatory Visit
Admission: RE | Admit: 2015-06-01 | Discharge: 2015-06-01 | Disposition: A | Discharge status: Home / Self Care | Payer: Medicare Other | Source: Ambulatory Visit | Attending: Family Medicine | Admitting: Family Medicine

## 2015-06-01 ENCOUNTER — Inpatient Hospital Stay: Payer: Medicare Other | Attending: Family Medicine | Admitting: Family Medicine

## 2015-06-01 DIAGNOSIS — Z122 Encounter for screening for malignant neoplasm of respiratory organs: Secondary | ICD-10-CM

## 2015-06-01 DIAGNOSIS — R911 Solitary pulmonary nodule: Secondary | ICD-10-CM | POA: Diagnosis not present

## 2015-06-01 DIAGNOSIS — Z87891 Personal history of nicotine dependence: Secondary | ICD-10-CM | POA: Diagnosis not present

## 2015-06-01 NOTE — Progress Notes (Signed)
In accordance with CMS guidelines, patient has meet eligibility criteria including age, absence of signs or symptoms of lung cancer, the specific calculation of cigarette smoking pack-years was 40 years and is a current smoker.   A shared decision-making session was conducted prior to the performance of CT scan. This includes one or more decision aids, includes benefits and harms of screening, follow-up diagnostic testing, over-diagnosis, false positive rate, and total radiation exposure.  Counseling on the importance of adherence to annual lung cancer LDCT screening, impact of co-morbidities, and ability or willingness to undergo diagnosis and treatment is imperative for compliance of the program.  Counseling on the importance of continued smoking cessation for former smokers; the importance of smoking cessation for current smokers and information about tobacco cessation interventions have been given to patient including the [facility], 1800 quit [place], as well as Cancer Center specific smoking cessation programs.  Written order for lung cancer screening with LDCT has been given to the patient and any and all questions have been answered to the best of my abilities.   Yearly follow up will be scheduled by Shawn Perkins, Thoracic Navigator.   

## 2015-06-06 ENCOUNTER — Telehealth: Payer: Self-pay | Admitting: *Deleted

## 2015-06-06 NOTE — Telephone Encounter (Signed)
  Oncology Nurse Navigator Documentation    Navigator Encounter Type: Telephone;Screening (06/06/15 1100)                          Notified patient of LDCT lung cancer screening results of Lung Rads 3 finding with recommendation for 6 month follow up imaging. Also notified of incidental finding noted below. Patient verbalizes understanding.    IMPRESSION: 1. 6.5 mm right upper lobe nodule. Lung-Rads category 3, probably benign findings. Short-term follow-up in 6 months is recommended with repeat low-dose chest CT without contrast (please use the following order, "CT CHEST LUNG CA SCREEN LOW DOSE W/O CM"). 2. Probable areas of subpleural scarring or atelectasis in the lower lobes. Attention on follow-up exam is suggested

## 2015-07-03 ENCOUNTER — Encounter: Payer: Self-pay | Admitting: Emergency Medicine

## 2015-07-03 ENCOUNTER — Encounter: Payer: Self-pay | Admitting: Nurse Practitioner

## 2015-07-03 ENCOUNTER — Emergency Department
Admission: EM | Admit: 2015-07-03 | Discharge: 2015-07-03 | Disposition: A | Discharge status: Other Acute Inpatient Hospital | Payer: Medicare Other | Attending: Emergency Medicine | Admitting: Emergency Medicine

## 2015-07-03 ENCOUNTER — Inpatient Hospital Stay
Admission: EM | Admit: 2015-07-03 | Discharge: 2015-07-09 | DRG: 885 | Disposition: A | Discharge status: Home / Self Care | Payer: Medicare Other | Source: Intra-hospital | Attending: Psychiatry | Admitting: Psychiatry

## 2015-07-03 DIAGNOSIS — F919 Conduct disorder, unspecified: Secondary | ICD-10-CM | POA: Diagnosis present

## 2015-07-03 DIAGNOSIS — F250 Schizoaffective disorder, bipolar type: Principal | ICD-10-CM | POA: Diagnosis present

## 2015-07-03 DIAGNOSIS — E119 Type 2 diabetes mellitus without complications: Secondary | ICD-10-CM | POA: Diagnosis present

## 2015-07-03 DIAGNOSIS — Z91199 Patient's noncompliance with other medical treatment and regimen due to unspecified reason: Secondary | ICD-10-CM

## 2015-07-03 DIAGNOSIS — F22 Delusional disorders: Secondary | ICD-10-CM

## 2015-07-03 DIAGNOSIS — E785 Hyperlipidemia, unspecified: Secondary | ICD-10-CM | POA: Diagnosis present

## 2015-07-03 DIAGNOSIS — Z9119 Patient's noncompliance with other medical treatment and regimen: Secondary | ICD-10-CM

## 2015-07-03 DIAGNOSIS — F419 Anxiety disorder, unspecified: Secondary | ICD-10-CM | POA: Diagnosis present

## 2015-07-03 DIAGNOSIS — F309 Manic episode, unspecified: Secondary | ICD-10-CM | POA: Diagnosis present

## 2015-07-03 DIAGNOSIS — Z79899 Other long term (current) drug therapy: Secondary | ICD-10-CM | POA: Diagnosis not present

## 2015-07-03 DIAGNOSIS — M1990 Unspecified osteoarthritis, unspecified site: Secondary | ICD-10-CM | POA: Diagnosis present

## 2015-07-03 DIAGNOSIS — I10 Essential (primary) hypertension: Secondary | ICD-10-CM | POA: Diagnosis present

## 2015-07-03 DIAGNOSIS — Z9114 Patient's other noncompliance with medication regimen: Secondary | ICD-10-CM | POA: Diagnosis not present

## 2015-07-03 DIAGNOSIS — F17210 Nicotine dependence, cigarettes, uncomplicated: Secondary | ICD-10-CM | POA: Diagnosis present

## 2015-07-03 DIAGNOSIS — J45909 Unspecified asthma, uncomplicated: Secondary | ICD-10-CM | POA: Diagnosis present

## 2015-07-03 DIAGNOSIS — Z720 Tobacco use: Secondary | ICD-10-CM | POA: Diagnosis not present

## 2015-07-03 DIAGNOSIS — F17200 Nicotine dependence, unspecified, uncomplicated: Secondary | ICD-10-CM | POA: Diagnosis present

## 2015-07-03 DIAGNOSIS — F200 Paranoid schizophrenia: Secondary | ICD-10-CM | POA: Diagnosis not present

## 2015-07-03 DIAGNOSIS — E039 Hypothyroidism, unspecified: Secondary | ICD-10-CM | POA: Diagnosis present

## 2015-07-03 DIAGNOSIS — F3164 Bipolar disorder, current episode mixed, severe, with psychotic features: Secondary | ICD-10-CM

## 2015-07-03 HISTORY — DX: Hypothyroidism, unspecified: E03.9

## 2015-07-03 LAB — URINE DRUG SCREEN, QUALITATIVE (ARMC ONLY)
Amphetamines, Ur Screen: NOT DETECTED
BARBITURATES, UR SCREEN: NOT DETECTED
BENZODIAZEPINE, UR SCRN: NOT DETECTED
CANNABINOID 50 NG, UR ~~LOC~~: NOT DETECTED
COCAINE METABOLITE, UR ~~LOC~~: NOT DETECTED
MDMA (Ecstasy)Ur Screen: NOT DETECTED
Methadone Scn, Ur: NOT DETECTED
Opiate, Ur Screen: NOT DETECTED
Phencyclidine (PCP) Ur S: NOT DETECTED
TRICYCLIC, UR SCREEN: NOT DETECTED

## 2015-07-03 LAB — COMPREHENSIVE METABOLIC PANEL
ALBUMIN: 4.4 g/dL (ref 3.5–5.0)
ALK PHOS: 76 U/L (ref 38–126)
ALT: 14 U/L — AB (ref 17–63)
ANION GAP: 7 (ref 5–15)
AST: 22 U/L (ref 15–41)
BUN: 12 mg/dL (ref 6–20)
CALCIUM: 9.4 mg/dL (ref 8.9–10.3)
CHLORIDE: 99 mmol/L — AB (ref 101–111)
CO2: 30 mmol/L (ref 22–32)
Creatinine, Ser: 1.22 mg/dL (ref 0.61–1.24)
GFR calc Af Amer: >60 mL/min (ref >60)
GFR calc non Af Amer: >60 mL/min (ref >60)
GLUCOSE: 200 mg/dL — AB (ref 65–99)
Potassium: 4.2 mmol/L (ref 3.5–5.1)
SODIUM: 136 mmol/L (ref 135–145)
Total Bilirubin: 0.4 mg/dL (ref 0.3–1.2)
Total Protein: 8.2 g/dL — ABNORMAL HIGH (ref 6.5–8.1)

## 2015-07-03 LAB — ETHANOL: Alcohol, Ethyl (B): <5 mg/dL (ref <5)

## 2015-07-03 LAB — ACETAMINOPHEN LEVEL

## 2015-07-03 LAB — CBC
HEMATOCRIT: 44 % (ref 40.0–52.0)
HEMOGLOBIN: 14.8 g/dL (ref 13.0–18.0)
MCH: 28 pg (ref 26.0–34.0)
MCHC: 33.6 g/dL (ref 32.0–36.0)
MCV: 83.3 fL (ref 80.0–100.0)
Platelets: 220 10*3/uL (ref 150–440)
RBC: 5.28 MIL/uL (ref 4.40–5.90)
RDW: 13.4 % (ref 11.5–14.5)
WBC: 4.4 10*3/uL (ref 3.8–10.6)

## 2015-07-03 LAB — SALICYLATE LEVEL: Salicylate Lvl: <4 mg/dL (ref 2.8–30.0)

## 2015-07-03 MED ORDER — DIVALPROEX SODIUM 500 MG PO DR TAB
500.0000 mg | DELAYED_RELEASE_TABLET | Freq: Two times a day (BID) | ORAL | Status: DC
  Administered 2015-07-03: 500 mg via ORAL
  Filled 2015-07-03: qty 1

## 2015-07-03 MED ORDER — CLONAZEPAM 0.5 MG PO TABS
0.5000 mg | ORAL_TABLET | Freq: Two times a day (BID) | ORAL | Status: DC
  Administered 2015-07-03: 0.5 mg via ORAL
  Filled 2015-07-03: qty 1

## 2015-07-03 MED ORDER — GLIPIZIDE ER 10 MG PO TB24: 10.0000 mg | ORAL_TABLET | Freq: Every day | ORAL | Status: DC

## 2015-07-03 MED ORDER — LINAGLIPTIN 5 MG PO TABS
5.0000 mg | ORAL_TABLET | Freq: Every day | ORAL | Status: DC
  Administered 2015-07-03: 5 mg via ORAL
  Filled 2015-07-03: qty 1

## 2015-07-03 MED ORDER — LEVOTHYROXINE SODIUM 50 MCG PO TABS: 50.0000 ug | ORAL_TABLET | Freq: Every day | ORAL | Status: DC

## 2015-07-03 MED ORDER — PALIPERIDONE ER 6 MG PO TB24: 6.0000 mg | ORAL_TABLET | Freq: Every day | ORAL | Status: DC

## 2015-07-03 MED ORDER — SIMVASTATIN 40 MG PO TABS: 20.0000 mg | ORAL_TABLET | Freq: Every day | ORAL | Status: DC

## 2015-07-03 MED ORDER — LISINOPRIL 5 MG PO TABS: 5.0000 mg | ORAL_TABLET | Freq: Every day | ORAL | Status: DC

## 2015-07-03 MED ORDER — METFORMIN HCL 500 MG PO TABS: 1000.0000 mg | ORAL_TABLET | Freq: Two times a day (BID) | ORAL | Status: DC

## 2015-07-03 NOTE — ED Notes (Addendum)
Pt here from assisted living apartments with irrational thoughts, when asking patient why he is here today, pt states "I'm trying to get home but I can't tell you where that is"; Pt has been off medications; Pt denies SI, HI, hallucinations; pt's appearance is well kept. Lyndon Codeoreen Allen, Residential supervisor, [phone], ACT team called ahead. Pt talking about vacations to [place], went and asked bank for [employer]K dollar loan, has been walking all over [place] today.

## 2015-07-03 NOTE — Consult Note (Signed)
[facility] Face-to-Face Psychiatry Consult   Reason for Consult:  54 year old man with a history of schizoaffective disorder who was sent here by his act team because of concerns about worsening psychosis. Referring Physician:  Thomasene Lot Patient Identification: Justin Weeks MRN:  [id number] Principal Diagnosis: Schizoaffective disorder [facility]) Diagnosis:   Patient Active Problem List   Diagnosis Date Noted  . Noncompliance [Z91.19] 07/03/2015  . Schizoaffective disorder ([place]) [F25.9] 07/03/2015  . Hypothyroid [E03.9] 07/03/2015  . Personal history of tobacco use, presenting hazards to health [Z87.891] 05/31/2015  . Schizophrenia ([place]) [F20.9] 04/20/2015  . Diabetes ([place]) [E11.9] 04/20/2015    Total Time spent with patient: 1 hour  Subjective:   Justin Weeks is a 54 y.o. male patient admitted with "I don't know why I'm here. I don't need to be here".  HPI:  Information from the patient and the chart. Patient interviewed. Chart reviewed. Old chart notes reviewed. Labs reviewed. This 54 year old man was sent here by his act team who were following up with him and found him to be more paranoid and disorganized and confused in his thinking. They suspect he has been noncompliant with treatment. Patient per day much admits that he has not been taking any Ms. medicine recently including not taking his medicine for his diabetes. He doesn't give any real reason for that. He says that his mood is all right. Admits that he sleeps poorly at night. He denies that he's been drinking or using any drugs. Doesn't report any other acute stress. Admits that his thoughts are racing he is coy about any psychosis.  Family history: Patient denies knowing of any family history of mental illness  Social history: He lives in an independent living but supported apartment and has frequent act team involvement.  Medical history: Has diabetes mostly managed with oral medicine also high blood pressure dyslipidemia and  hypothyroidism.  Substance abuse history: Patient denies that he's been drinking or using drugs and denies that he's had alcohol or drug problems in the past.  Past Psychiatric History: Patient has a long history of psychotic disorder most likely schizoaffective disorder. Takes Depakote and antipsychotic injections although it's unclear which one he is most recently been taking. Probably currently noncompliant. Denies past history of suicide attempts or violence.  Risk to Self: Suicidal Ideation: No Suicidal Intent: No Is patient at risk for suicide?: No Suicidal Plan?: No Access to Means: No What has been your use of drugs/alcohol within the last 12 months?: None reported How many times?: 0 Other Self Harm Risks: None reported Triggers for Past Attempts: None known Intentional Self Injurious Behavior: None Risk to Others: Homicidal Ideation: No Thoughts of Harm to Others: No Current Homicidal Intent: No Current Homicidal Plan: No Access to Homicidal Means: No Identified Victim: None reported History of harm to others?: No Assessment of Violence: None Noted Violent Behavior Description: None noted Does patient have access to weapons?: No Criminal Charges Pending?: No Does patient have a court date: No Prior Inpatient Therapy: Prior Inpatient Therapy: Yes Prior Therapy Dates: unknown Prior Therapy Facilty/Provider(s): unknown Reason for Treatment: Schizoaffective Bipolar Prior Outpatient Therapy: Prior Outpatient Therapy: Yes Prior Therapy Dates: Current Prior Therapy Facilty/Provider(s): [facility] Reason for Treatment: Schizoaffective Bipolar Does patient have an ACCT team?: Yes Does patient have Intensive In-House Services?  : No Does patient have [facility] services? : No Does patient have P4CC services?: No  Past Medical History:  Past Medical History  Diagnosis Date  . Arthritis   . Asthma   .  Blood transfusion without reported diagnosis   . Diabetes mellitus  without complication ([place])   . Hypertension   . Seizures ([place])   . Personal history of tobacco use, presenting hazards to health 05/31/2015  . Hypothyroid 07/03/2015   History reviewed. No pertinent past surgical history. Family History: No family history on file. Family Psychiatric  History: As noted previously the patient denies any mental health history for his family. Social History:  History  Alcohol Use No     History  Drug Use Not on file    Social History   Social History  . Marital Status: Divorced    Spouse Name: N/A  . Number of Children: N/A  . Years of Education: N/A   Social History Main Topics  . Smoking status: Current Every Day Smoker -- 1.00 packs/day for 40 years  . Smokeless tobacco: None  . Alcohol Use: No  . Drug Use: None  . Sexual Activity: Yes   Other Topics Concern  . None   Social History Narrative   Additional Social History:    Pain Medications: None Reported Prescriptions: None Reported Over the Counter: None Reported History of alcohol / drug use?: No history of alcohol / drug abuse                     Allergies:   Allergies  Allergen Reactions  . 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      Labs:  Results for orders placed or performed during the hospital encounter of 07/03/15 (from the past 48 hour(s))  Comprehensive metabolic panel     Status: Abnormal   Collection Time: 07/03/15  3:25 PM  Result Value Ref Range   Sodium 136 135 - 145 mmol/L   Potassium 4.2 3.5 - 5.1 mmol/L   Chloride 99 (L) 101 - 111 mmol/L   CO2 30 22 - 32 mmol/L   Glucose, Bld 200 (H) 65 - 99 mg/dL   BUN 12 6 - 20 mg/dL   Creatinine, Ser 1.22 0.61 - 1.24 mg/dL   Calcium 9.4 8.9 - 10.3 mg/dL   Total Protein 8.2 (H) 6.5 - 8.1 g/dL   Albumin 4.4 3.5 - 5.0 g/dL   AST 22 15 - 41 U/L   ALT 14 (L) 17 - 63 U/L   Alkaline Phosphatase 76 38 - 126 U/L   Total  Bilirubin 0.4 0.3 - 1.2 mg/dL   GFR calc non Af Amer >60 >60 mL/min   GFR calc Af Amer >60 >60 mL/min    Comment: (NOTE) The eGFR has been calculated using the CKD EPI equation. This calculation has not been validated in all clinical situations. eGFR's persistently <60 mL/min signify possible Chronic Kidney Disease.    Anion gap 7 5 - 15  Ethanol (ETOH)     Status: None   Collection Time: 07/03/15  3:25 PM  Result Value Ref Range   Alcohol, Ethyl (B) <5 <5 mg/dL    Comment:        LOWEST DETECTABLE LIMIT FOR SERUM ALCOHOL IS 5 mg/dL FOR MEDICAL PURPOSES ONLY   Salicylate level     Status: None   Collection Time: 07/03/15  3:25 PM  Result Value Ref Range   Salicylate Lvl <0.3 2.8 - 30.0 mg/dL  Acetaminophen level     Status: Abnormal   Collection Time: 07/03/15  3:25 PM  Result Value Ref Range  Acetaminophen (Tylenol), Serum <10 (L) 10 - 30 ug/mL    Comment:        THERAPEUTIC CONCENTRATIONS VARY SIGNIFICANTLY. A RANGE OF 10-30 ug/mL MAY BE AN EFFECTIVE CONCENTRATION FOR MANY PATIENTS. HOWEVER, SOME ARE BEST TREATED AT CONCENTRATIONS OUTSIDE THIS RANGE. ACETAMINOPHEN CONCENTRATIONS >150 ug/mL AT 4 HOURS AFTER INGESTION AND >50 ug/mL AT 12 HOURS AFTER INGESTION ARE OFTEN ASSOCIATED WITH TOXIC REACTIONS.   CBC     Status: None   Collection Time: 07/03/15  3:25 PM  Result Value Ref Range   WBC 4.4 3.8 - 10.6 K/uL   RBC 5.28 4.40 - 5.90 MIL/uL   Hemoglobin 14.8 13.0 - 18.0 g/dL   HCT 44.0 40.0 - 52.0 %   MCV 83.3 80.0 - 100.0 fL   MCH 28.0 26.0 - 34.0 pg   MCHC 33.6 32.0 - 36.0 g/dL   RDW 13.4 11.5 - 14.5 %   Platelets 220 150 - 440 K/uL  Urine Drug Screen, Qualitative ([facility] only)     Status: None   Collection Time: 07/03/15  3:25 PM  Result Value Ref Range   Tricyclic, Ur Screen NONE DETECTED NONE DETECTED   Amphetamines, Ur Screen NONE DETECTED NONE DETECTED   MDMA (Ecstasy)Ur Screen NONE DETECTED NONE DETECTED   Cocaine Metabolite,Ur [place] NONE DETECTED NONE  DETECTED   Opiate, Ur Screen NONE DETECTED NONE DETECTED   Phencyclidine (PCP) Ur S NONE DETECTED NONE DETECTED   Cannabinoid 50 Ng, Ur [place] NONE DETECTED NONE DETECTED   Barbiturates, Ur Screen NONE DETECTED NONE DETECTED   Benzodiazepine, Ur Scrn NONE DETECTED NONE DETECTED   Methadone Scn, Ur NONE DETECTED NONE DETECTED    Comment: (NOTE) 937  Tricyclics, urine               Cutoff 1000 ng/mL 200  Amphetamines, urine             Cutoff 1000 ng/mL 300  MDMA (Ecstasy), urine           Cutoff 500 ng/mL 400  Cocaine Metabolite, urine       Cutoff 300 ng/mL 500  Opiate, urine                   Cutoff 300 ng/mL 600  Phencyclidine (PCP), urine      Cutoff 25 ng/mL 700  Cannabinoid, urine              Cutoff 50 ng/mL 800  Barbiturates, urine             Cutoff 200 ng/mL 900  Benzodiazepine, urine           Cutoff 200 ng/mL 1000 Methadone, urine                Cutoff 300 ng/mL 1100 1200 The urine drug screen provides only a preliminary, unconfirmed 1300 analytical test result and should not be used for non-medical 1400 purposes. Clinical consideration and professional judgment should 1500 be applied to any positive drug screen result due to possible 1600 interfering substances. A more specific alternate chemical method 1700 must be used in order to obtain a confirmed analytical result.  1800 Gas chromato graphy / mass spectrometry (GC/MS) is the preferred 1900 confirmatory method.     Current Facility-Administered Medications  Medication Dose Route Frequency Provider Last Rate Last Dose  . clonazePAM (KLONOPIN) tablet 0.5 mg  0.5 mg Oral BID Gonzella Lex, MD      . divalproex (DEPAKOTE) DR tablet 500 mg  500 mg Oral Q12H Gonzella Lex, MD      . Derrill Memo ON 07/04/2015] glipiZIDE (GLUCOTROL XL) 24 hr tablet 10 mg  10 mg Oral Q breakfast Gonzella Lex, MD      . Derrill Memo ON 07/04/2015] levothyroxine (SYNTHROID, LEVOTHROID) tablet 50 mcg  50 mcg Oral QAC breakfast Gonzella Lex, MD      .  linagliptin (TRADJENTA) tablet 5 mg  5 mg Oral Daily John T Clapacs, MD      . lisinopril (PRINIVIL,ZESTRIL) tablet 5 mg  5 mg Oral Daily Gonzella Lex, MD      . Derrill Memo ON 07/04/2015] metFORMIN (GLUCOPHAGE) tablet 1,000 mg  1,000 mg Oral BID WC Gonzella Lex, MD      . Derrill Memo ON 07/04/2015] simvastatin (ZOCOR) tablet 20 mg  20 mg Oral q1800 Gonzella Lex, MD       Current Outpatient Prescriptions  Medication Sig Dispense Refill  . clonazePAM (KLONOPIN) 0.5 MG tablet Take 0.5 mg by mouth 2 (two) times daily.     . divalproex (DEPAKOTE ER) 500 MG 24 hr tablet Take 500-1,000 mg by mouth 2 (two) times daily. Pt takes two tablets in the morning and one tablet at bedtime.    Marland Kitchen glipiZIDE (GLUCOTROL XL) 10 MG 24 hr tablet Take 10 mg by mouth 2 (two) times daily.    Marland Kitchen levothyroxine (SYNTHROID, LEVOTHROID) 50 MCG tablet Take 50 mcg by mouth daily.     . metFORMIN (GLUCOPHAGE-XR) 500 MG 24 hr tablet Take 1,000 mg by mouth 2 (two) times daily.     . paliperidone (INVEGA SUSTENNA) 156 MG/ML SUSP injection Inject 156 mg into the muscle every 30 (thirty) days.    . quinapril (ACCUPRIL) 5 MG tablet Take 5 mg by mouth daily.     . risperiDONE (RISPERDAL) 3 MG tablet Take 6 mg by mouth at bedtime.     . risperiDONE microspheres (RISPERDAL CONSTA) 50 MG injection Inject 50 mg into the muscle every 14 (fourteen) days.    . simvastatin (ZOCOR) 20 MG tablet Take 20 mg by mouth at bedtime.     . sitaGLIPtin (JANUVIA) 100 MG tablet Take 100 mg by mouth daily.      Musculoskeletal: Strength & Muscle Tone: within normal limits Gait & Station: normal Patient leans: N/A  Psychiatric Specialty Exam: Review of Systems  Constitutional: Negative.   HENT: Negative.   Eyes: Negative.   Respiratory: Negative.   Cardiovascular: Negative.   Gastrointestinal: Negative.   Musculoskeletal: Negative.   Skin: Negative.   Neurological: Negative.   Psychiatric/Behavioral: Negative for depression, suicidal ideas,  hallucinations, memory loss and substance abuse. The patient has insomnia. The patient is not nervous/anxious.     Blood pressure 116/72, pulse 95, temperature 98.4 F (36.9 C), temperature source Oral, resp. rate 16, height '5\' 8"'  (1.727 m), weight 77.111 kg (170 lb), SpO2 98 %.Body mass index is 25.85 kg/(m^2).  General Appearance: Disheveled  Eye Contact::  Good  Speech:  Pressured  Volume:  Increased  Mood:  Irritable  Affect:  Labile  Thought Process:  Loose  Orientation:  Full (Time, Place, and Person)  Thought Content:  Paranoid Ideation  Suicidal Thoughts:  No  Homicidal Thoughts:  No  Memory:  Immediate;   Good Recent;   Fair Remote;   Fair  Judgement:  Impaired  Insight:  Shallow  Psychomotor Activity:  Restlessness  Concentration:  Fair  Recall:  [place]: Fair  Akathisia:  No  Handed:  Right  AIMS (if indicated):     Assets:  Agricultural consultant Housing Resilience Social Support  ADL's:  Intact  Cognition: WNL  Sleep:      Treatment Plan Summary: Daily contact with patient to assess and evaluate symptoms and progress in treatment, Medication management and Plan 54 year old man currently under IVC. History of schizoaffective disorder. Currently with racing thoughts agitated behavior disorganized thinking and admitted noncompliance. Blood sugar is elevated at 200. Doesn't seem to be taking care of his health well. Act team is concerned about his dangerousness to himself. Patient does have a history of spotty compliance in the past. He is under IVC and that will be continued he will be admitted to the hospital on the basis of psychosis. Continue his prescribed medicine but the use of injectables will need to be reevaluated. I will go ahead and order iinvega 6 mg a day for now as an oral medicine. He is reviewed with emergency room. He will also need his blood sugars checked regularly and may need a medicine  consult.  Disposition: Recommend psychiatric Inpatient admission when medically cleared.  John Clapacs 07/03/2015 9:06 PM

## 2015-07-03 NOTE — ED Provider Notes (Signed)
[facility] Emergency Department Provider Note  ____________________________________________  Time seen: Approximately 3:47 PM  I have reviewed the triage vital signs and the nursing notes.   HISTORY  Chief Complaint Behavior Problem    HPI Justin Weeks is a 54 y.o. male with a history of schizophrenia and bipolar disorder sent by his act team physician for noncompliance with his medication, disorganized behavior, and increased paranoia. The patient reports that he is here because "I can't take no horse pills. They constipate me. They are experimenting on me." The patient denies any current constipation or any other pain or medical problems at this time.  I did receive a call from the patient's outpatient psychiatrist who was concerned that this patient is now longer stabilized on his medications and unsafe to go home. She states that this has been a progressive change over the last several months with increased mood lability, and paranoia. She states that the patient no longer trusts providers in the outpatient setting.   Past Medical History  Diagnosis Date  . Arthritis   . Asthma   . Blood transfusion without reported diagnosis   . Diabetes mellitus without complication (HCC)   . Hypertension   . Seizures (HCC)   . Personal history of tobacco use, presenting hazards to health 05/31/2015    Patient Active Problem List   Diagnosis Date Noted  . Personal history of tobacco use, presenting hazards to health 05/31/2015  . Schizophrenia (HCC) 04/20/2015  . Diabetes (HCC) 04/20/2015    History reviewed. No pertinent past surgical history.  Current Outpatient Rx  Name  Route  Sig  Dispense  Refill  . clonazePAM (KLONOPIN) 0.5 MG tablet   Oral   Take 1 tablet by mouth 2 (two) times daily at 8 am and 10 pm.         . divalproex (DEPAKOTE ER) 500 MG 24 hr tablet   Oral   Take 1-2 tablets by mouth daily. tk 2 qam and 1 qhs         . GLIPIZIDE XL  10 MG 24 hr tablet   Oral   Take 1 tablet by mouth 2 (two) times daily.           Dispense as written.   Marland Kitchen JANUVIA 100 MG tablet   Oral   Take 1 tablet by mouth daily.           Dispense as written.   Marland Kitchen levothyroxine (SYNTHROID, LEVOTHROID) 50 MCG tablet   Oral   Take 1 tablet by mouth daily.         . metFORMIN (GLUCOPHAGE-XR) 500 MG 24 hr tablet   Oral   Take 2 tablets by mouth 2 (two) times daily.         . quinapril (ACCUPRIL) 5 MG tablet   Oral   Take 1 tablet by mouth daily.         . risperiDONE (RISPERDAL) 3 MG tablet   Oral   Take 2 tablets by mouth at bedtime.         . simvastatin (ZOCOR) 20 MG tablet   Oral   Take 1 tablet by mouth every evening.         . trihexyphenidyl (ARTANE) 5 MG tablet   Oral   Take 5 mg by mouth daily.           Allergies Haldol  No family history on file.  Social History Social History  Substance Use Topics  .  Smoking status: Current Every Day Smoker -- 1.00 packs/day for 40 years  . Smokeless tobacco: None  . Alcohol Use: No    Review of Systems Constitutional: No fever/chills. No syncope. Eyes: No visual changes. ENT: No sore throat. Cardiovascular: Denies chest pain, palpitations. Respiratory: Denies shortness of breath.  No cough. Gastrointestinal: No abdominal pain.  No nausea, no vomiting.  No diarrhea.  No constipation. Genitourinary: Negative for dysuria. Musculoskeletal: Negative for back pain. Skin: Negative for rash. Neurological: Negative for headaches, focal weakness or numbness. Psychiatric:Positive for paranoia. Denies any SI, HI, or hallucinations.  10-point ROS otherwise negative.  ____________________________________________   PHYSICAL EXAM:  VITAL SIGNS: ED Triage Vitals  Enc Vitals Group     BP 07/03/15 1520 116/72 mmHg     Pulse Rate 07/03/15 1520 95     Resp 07/03/15 1520 16     Temp 07/03/15 1520 98.4 F (36.9 C)     Temp Source 07/03/15 1520 Oral     SpO2  07/03/15 1520 98 %     Weight 07/03/15 1520 170 lb (77.111 kg)     Height 07/03/15 1520 5\' 8"  (1.727 m)     Head Cir --      Peak Flow --      Pain Score 07/03/15 1520 0     Pain Loc --      Pain Edu? --      Excl. in GC? --     Constitutional: Alert and oriented. Well appearing and in no acute distress. Answer question appropriately. Eyes: Conjunctivae are normal.  EOMI. Head: Atraumatic. Nose: No congestion/rhinnorhea. Mouth/Throat: Mucous membranes are moist.  Neck: No stridor.  Supple.   Cardiovascular: Normal rate, regular rhythm. No murmurs, rubs or gallops.  Respiratory: Normal respiratory effort.  No retractions. Lungs CTAB.  No wheezes, rales or ronchi. Gastrointestinal: Soft and nontender. No distention. No peritoneal signs. Musculoskeletal: No LE edema.  Neurologic:  Normal speech and language. No gross focal neurologic deficits are appreciated.  Skin:  Skin is warm, dry and intact. No rash noted. Psychiatric: Patient has a bizarre affect with intermittent episodes with pressured speech. He does demonstrate some paranoia in both his behavior and his speech.  ____________________________________________   LABS (all labs ordered are listed, but only abnormal results are displayed)  Labs Reviewed  CBC  COMPREHENSIVE METABOLIC PANEL  ETHANOL  SALICYLATE LEVEL  ACETAMINOPHEN LEVEL  URINE DRUG SCREEN, QUALITATIVE ([facility] ONLY)   ____________________________________________  EKG  Not indicated ____________________________________________  RADIOLOGY  No results found.  ____________________________________________   PROCEDURES  Procedure(s) performed: None  Critical Care performed: No ____________________________________________   INITIAL IMPRESSION / ASSESSMENT AND PLAN / ED COURSE  Pertinent labs & imaging results that were available during my care of the patient were reviewed by me and considered in my medical decision making (see chart for  details).  54 y.o. male with a history of schizophrenia and bipolar disorder presenting with medication noncompliance, bizarre and paranoid behavior. At this time, the patient denies any SI or HI, but he does not have good insight into why he is here or what has been going on. He has no medical complaints at this time. I will plan to do is medical clearance and then anticipate his final disposition to be made by the psychiatric team.  ----------------------------------------- 4:07 PM on 07/03/2015 -----------------------------------------  I will complete the IVC paperwork for this patient. Dr. Carollee MassedKaminski will follow up the labs for the medical clearance for this patient. I anticipate  his disposition will be psychiatric.  ____________________________________________  FINAL CLINICAL IMPRESSION(S) / ED DIAGNOSES  Final diagnoses:  Acute paranoia (HCC)  Paranoid schizophrenia (HCC)  Bipolar disorder, current episode mixed, severe, with psychotic features (HCC)      NEW MEDICATIONS STARTED DURING THIS VISIT:  New Prescriptions   No medications on file     Rockne Menghini, MD 07/03/15 1607

## 2015-07-03 NOTE — BH Assessment (Signed)
Assessment Note  Justin Weeks is an 54 y.o. male. who presents to [facility] ED after his, [facility] ACT Team, petitioned for Pt's involuntary commitment for manic behaviors due to being non med compliant.   Pt reports "I'm fine, I'm ready to get out of here. I just got paid and I got things to do."  Pt states "they" are out to get me (referring to his ACT Team). Pt states that he takes his medications like he is suppose to do every morning at 4:30 am. Pt reports that his ACT team is giving him too much medicine and he doesn't need all that medicine.    Pt denies any mental health diagnosis, however his according to information from his ACT Team he has a history of schizoaffective bipolar. He denies any symptoms of his mental health diagnosis and states everything is "fine" and asked the writer multiple times, "Don't I seem fine?"  Pt denies suicidal thoughts.   Pt denies any self injurious behaviors. Pt denies homicidal ideation or history of violence. Pt denies any history of auditory or visual hallucinations, however it appears he is having persecutory beliefs on his ACT team being out to get him. Pt denies access to firearms or other weapons. Pt denies any current substance use and/or abuse.   He identifies his brother in [place] and father as being family or friends that are supportive.  He states he is currently unemployed.  Pt lives in assisted apartments and is able to return to his current living situation. Pt denies any physical, verbal, sexual abuse currently or as a child. Pt denies legal problems.  Pt is dressed in hospital scrubs, alert, oriented x4 with overtly loud speech and normal motor behavior. Eye contact is good. Pt's mood is pleasant and affect is slightly anxious about leaving. Thought process is coherent and relevant. Cognitive functioning and fund of knowledge is intact and age appropriate. Pt was cooperative throughout assessment.  Collateral information collected from  Dr. Tonna Corner, [facility] ACT Team.  Dr. Koleen Nimrod states that she has known the Pt for over 10 years and says he has been stable for the past couple of years, but declining over the past couple of months.  Pt's depakote level was very low and the Pt had admitted to her that he stopped taking it several months ago. Pt denied this during his assessment. Dr. Koleen Nimrod stated he has become increasingly paranoid and labile. She states Pt admitted to her that he hasn't slept well for at least the past week, only about 24 hours total. Pt recently went to the bank and applied for a $25,000 loan and is working on buying a trailer.  The ACT is very worried about the Pt's current state and is afraid he will make a financial decision that might negatively impact him for the future during this manic state (he has bought a car before during a manic state without the means to pay for the car).  Dr. Koleen Nimrod feels the Pt would benefit from inpatient hospitalization.  Phone numbers to reach the ACT Team: [phone], [phone], Dr. Alcide Clever personal number [phone].  Diagnosis:   Past Medical History:  Past Medical History  Diagnosis Date  . Arthritis   . Asthma   . Blood transfusion without reported diagnosis   . Diabetes mellitus without complication (HCC)   . Hypertension   . Seizures (HCC)   . Personal history of tobacco use, presenting hazards to health 05/31/2015    History  reviewed. No pertinent past surgical history.  Family History: No family history on file.  Social History:  reports that he has been smoking.  He does not have any smokeless tobacco history on file. He reports that he does not drink alcohol. His drug history is not on file.  Additional Social History:  Alcohol / Drug Use Pain Medications: None Reported Prescriptions: None Reported Over the Counter: None Reported History of alcohol / drug use?: No history of alcohol / drug abuse  CIWA: CIWA-Ar BP: 116/72  mmHg Pulse Rate: 95 COWS:    Allergies:  Allergies  Allergen Reactions  . Haldol [Haloperidol] Other (See Comments)    Pt states that he has "lockjaw" and he becomes very agitated.    Home Medications:  (Not in a hospital admission)  OB/GYN Status:  No LMP for male patient.  General Assessment Data [facility] Assessment: In system Is this a Tele or Face-to-Face Assessment?: Face-to-Face Is this an Initial Assessment or a Re-assessment for this encounter?: Initial Assessment Marital status: [place]orced [place] Is patient pregnant?: No Pregnancy Status: No Living Arrangements: Alone Can pt return to current living arrangement?: Yes Admission Status: Involuntary Is patient capable of signing voluntary admission?: No Referral Source: Other (ACT Team) Insurance type: Medicare     Crisis Care Plan Living Arrangements: Alone [facility]- Dr. Tonna CornerKaren Billmire ([phone])  Education Status Is patient currently in school?: No Current Grade: 0 Highest grade of school patient has completed: 7011 Name of school: N/a Contact person: [facility] [phone])  Risk to self with the past 6 months Suicidal Ideation: No Has patient been a risk to self within the past 6 months prior to admission? : No Suicidal Intent: No Has patient had any suicidal intent within the past 6 months prior to admission? : No Is patient at risk for suicide?: No Suicidal Plan?: No Has patient had any suicidal plan within the past 6 months prior to admission? : No Access to Means: No What has been your use of drugs/alcohol within the last 12 months?: None reported Previous Attempts/Gestures: No How many times?: 0 Other Self Harm Risks: None reported Triggers for Past Attempts: None known Intentional Self Injurious Behavior: None Family Suicide History: No Recent stressful life event(s):  (None reported) Persecutory  voices/beliefs?: Yes Depression: No Depression Symptoms:  (PT denied) Substance abuse history and/or treatment for substance abuse?: No Suicide prevention information given to non-admitted patients: Not applicable  Risk to Others within the past 6 months Homicidal Ideation: No Does patient have any lifetime risk of violence toward others beyond the six months prior to admission? : No Thoughts of Harm to Others: No Current Homicidal Intent: No Current Homicidal Plan: No Access to Homicidal Means: No Identified Victim: None reported History of harm to others?: No Assessment of Violence: None Noted Violent Behavior Description: None noted Does patient have access to weapons?: No Criminal Charges Pending?: No Does patient have a court date: No Is patient on probation?: No  Psychosis Hallucinations: None noted Delusions: None noted  Mental Status Report Appearance/Hygiene: Disheveled, In scrubs Eye Contact: Good Motor Activity: Freedom of movement Speech: Loud Level of Consciousness: Alert Mood: Pleasant Affect: Anxious Anxiety Level: Moderate Thought Processes: Coherent, Relevant Judgement: Partial Orientation: Person, Place, Time, Situation, Appropriate for developmental age Obsessive Compulsive Thoughts/Behaviors: Moderate  Cognitive Functioning Concentration: Normal Memory: Recent Intact, Remote Intact IQ: Average Insight: Fair Impulse Control: Fair Appetite: Fair Weight Loss:  0 Weight Gain: 0 Sleep: No Change Total Hours of Sleep: 8 Vegetative Symptoms: None  ADLScreening [facility] Assessment Services) Patient's cognitive ability adequate to safely complete daily activities?: Yes Patient able to express need for assistance with ADLs?: Yes Independently performs ADLs?: Yes (appropriate for developmental age)  Prior Inpatient Therapy Prior Inpatient Therapy: Yes Prior Therapy Dates: unknown Prior Therapy Facilty/Provider(s): unknown Reason for Treatment:  Schizoaffective Bipolar  Prior Outpatient Therapy Prior Outpatient Therapy: Yes Prior Therapy Dates: Current Prior Therapy Facilty/Provider(s): [facility] Reason for Treatment: Schizoaffective Bipolar Does patient have an ACCT team?: Yes Does patient have Intensive In-House Services?  : No Does patient have [facility] services? : No Does patient have P4CC services?: No  ADL Screening (condition at time of admission) Patient's cognitive ability adequate to safely complete daily activities?: Yes Patient able to express need for assistance with ADLs?: Yes Independently performs ADLs?: Yes (appropriate for developmental age)       Abuse/Neglect Assessment (Assessment to be complete while patient is alone) Physical Abuse: Denies Verbal Abuse: Denies Sexual Abuse: Denies Exploitation of patient/patient's resources: Denies Self-Neglect: Denies Values / Beliefs Cultural Requests During Hospitalization: None Spiritual Requests During Hospitalization: None   Advance Directives (For Healthcare) Does patient have an advance directive?: No Would patient like information on creating an advanced directive?: No - patient declined information    Additional Information 1:1 In Past 12 Months?: No CIRT Risk: No Elopement Risk: No     Disposition:  Disposition Initial Assessment Completed for this Encounter: Yes Disposition of Patient: Other dispositions Other disposition(s): Other (Comment) (Psych MD Consult)  On Site Evaluation by:   Reviewed with Physician:    Ramon Dredge Takiera Mayo 07/03/2015 5:42 PM

## 2015-07-03 NOTE — ED Notes (Signed)
[facility].  Pt belongings taken with patient.  Pt unable to sign due to being IVC.

## 2015-07-03 NOTE — ED Provider Notes (Signed)
-----------------------------------------   5:31 PM on 07/03/2015 -----------------------------------------  Labs Reviewed  COMPREHENSIVE METABOLIC PANEL - Abnormal; Notable for the following:    Chloride 99 (*)    Glucose, Bld 200 (*)    Total Protein 8.2 (*)    ALT 14 (*)    All other components within normal limits  ACETAMINOPHEN LEVEL - Abnormal; Notable for the following:    Acetaminophen (Tylenol), Serum <10 (*)    All other components within normal limits  ETHANOL  SALICYLATE LEVEL  CBC  URINE DRUG SCREEN, QUALITATIVE ([facility] ONLY)    ~~~~~~~~~~~~~~~~~~~~~~~~~~~~~~~~~~~~~~~~~~~~~  Patient has been seen by intake, TTS. They have spoken with mobile crisis, who reports the patient has been off his Depakote for a couple of months and apparently has taken out alone he can afford and has some other symptoms of mania. Psychiatric consultation pending.  ----------------------------------------- 09:35 PM on 07/03/2015 -----------------------------------------  The patient was seen by Dr. Toni Amendlapacs, psychiatry. He'll arrange for his admission to the hospital.  Darien Ramusavid W Jenifer Struve, MD 07/03/15 2336

## 2015-07-04 ENCOUNTER — Encounter: Payer: Self-pay | Admitting: Psychiatry

## 2015-07-04 DIAGNOSIS — F250 Schizoaffective disorder, bipolar type: Secondary | ICD-10-CM | POA: Diagnosis present

## 2015-07-04 DIAGNOSIS — I10 Essential (primary) hypertension: Secondary | ICD-10-CM | POA: Diagnosis present

## 2015-07-04 DIAGNOSIS — F17200 Nicotine dependence, unspecified, uncomplicated: Secondary | ICD-10-CM | POA: Insufficient documentation

## 2015-07-04 DIAGNOSIS — E785 Hyperlipidemia, unspecified: Secondary | ICD-10-CM | POA: Diagnosis present

## 2015-07-04 LAB — GLUCOSE, CAPILLARY
GLUCOSE-CAPILLARY: 123 mg/dL — AB (ref 65–99)
Glucose-Capillary: 136 mg/dL — ABNORMAL HIGH (ref 65–99)
Glucose-Capillary: 130 mg/dL — ABNORMAL HIGH (ref 65–99)
Glucose-Capillary: 113 mg/dL — ABNORMAL HIGH (ref 65–99)

## 2015-07-04 LAB — TSH: TSH: 1.063 u[IU]/mL (ref 0.350–4.500)

## 2015-07-04 LAB — LIPID PANEL
CHOL/HDL RATIO: 3.8 ratio
CHOLESTEROL: 113 mg/dL (ref 0–200)
HDL: 30 mg/dL — AB (ref >40)
LDL Cholesterol: 59 mg/dL (ref 0–99)
Triglycerides: 118 mg/dL (ref <150)
VLDL: 24 mg/dL (ref 0–40)

## 2015-07-04 LAB — HEMOGLOBIN A1C: Hgb A1c MFr Bld: 6.5 % — ABNORMAL HIGH (ref 4.0–6.0)

## 2015-07-04 MED ORDER — LISINOPRIL 5 MG PO TABS
5.0000 mg | ORAL_TABLET | Freq: Every day | ORAL | Status: DC
  Administered 2015-07-04 – 2015-07-09 (×6): 5 mg via ORAL
  Filled 2015-07-04 (×6): qty 1

## 2015-07-04 MED ORDER — DIVALPROEX SODIUM 500 MG PO DR TAB
500.0000 mg | DELAYED_RELEASE_TABLET | Freq: Three times a day (TID) | ORAL | Status: DC
  Administered 2015-07-04 – 2015-07-06 (×6): 500 mg via ORAL
  Filled 2015-07-04 (×6): qty 1

## 2015-07-04 MED ORDER — DIVALPROEX SODIUM 500 MG PO DR TAB
500.0000 mg | DELAYED_RELEASE_TABLET | Freq: Two times a day (BID) | ORAL | Status: DC
  Administered 2015-07-04: 500 mg via ORAL
  Filled 2015-07-04: qty 1

## 2015-07-04 MED ORDER — LINAGLIPTIN 5 MG PO TABS
5.0000 mg | ORAL_TABLET | Freq: Every day | ORAL | Status: DC
  Administered 2015-07-04 – 2015-07-09 (×6): 5 mg via ORAL
  Filled 2015-07-04 (×6): qty 1

## 2015-07-04 MED ORDER — GLIPIZIDE ER 10 MG PO TB24
10.0000 mg | ORAL_TABLET | Freq: Every day | ORAL | Status: DC
  Administered 2015-07-04 – 2015-07-09 (×6): 10 mg via ORAL
  Filled 2015-07-04 (×6): qty 1

## 2015-07-04 MED ORDER — METFORMIN HCL 500 MG PO TABS
1000.0000 mg | ORAL_TABLET | Freq: Two times a day (BID) | ORAL | Status: DC
  Administered 2015-07-04 – 2015-07-09 (×11): 1000 mg via ORAL
  Filled 2015-07-04 (×11): qty 2

## 2015-07-04 MED ORDER — PALIPERIDONE ER 3 MG PO TB24
9.0000 mg | ORAL_TABLET | Freq: Every day | ORAL | Status: DC
  Administered 2015-07-05 – 2015-07-09 (×5): 9 mg via ORAL
  Filled 2015-07-04 (×5): qty 3

## 2015-07-04 MED ORDER — SIMVASTATIN 20 MG PO TABS
20.0000 mg | ORAL_TABLET | Freq: Every day | ORAL | Status: DC
  Administered 2015-07-04 – 2015-07-08 (×5): 20 mg via ORAL
  Filled 2015-07-04 (×7): qty 1

## 2015-07-04 MED ORDER — INFLUENZA VAC SPLIT QUAD 0.5 ML IM SUSY
0.5000 mL | PREFILLED_SYRINGE | INTRAMUSCULAR | Status: AC
  Administered 2015-07-05: 0.5 mL via INTRAMUSCULAR
  Filled 2015-07-04: qty 0.5

## 2015-07-04 MED ORDER — PALIPERIDONE PALMITATE 234 MG/1.5ML IM SUSP: 234.0000 mg | Freq: Once | INTRAMUSCULAR | Status: DC

## 2015-07-04 MED ORDER — CLONAZEPAM 0.5 MG PO TABS
0.5000 mg | ORAL_TABLET | Freq: Two times a day (BID) | ORAL | Status: DC
  Administered 2015-07-04 – 2015-07-09 (×11): 0.5 mg via ORAL
  Filled 2015-07-04 (×11): qty 1

## 2015-07-04 MED ORDER — PNEUMOCOCCAL VAC POLYVALENT 25 MCG/0.5ML IJ INJ
0.5000 mL | INJECTION | INTRAMUSCULAR | Status: AC
  Administered 2015-07-05: 0.5 mL via INTRAMUSCULAR
  Filled 2015-07-04: qty 0.5

## 2015-07-04 MED ORDER — ACETAMINOPHEN 325 MG PO TABS: 650.0000 mg | ORAL_TABLET | Freq: Four times a day (QID) | ORAL | Status: DC | PRN

## 2015-07-04 MED ORDER — NICOTINE 21 MG/24HR TD PT24
21.0000 mg | MEDICATED_PATCH | Freq: Every day | TRANSDERMAL | Status: DC
  Administered 2015-07-04 – 2015-07-09 (×6): 21 mg via TRANSDERMAL
  Filled 2015-07-04 (×5): qty 1

## 2015-07-04 MED ORDER — MAGNESIUM HYDROXIDE 400 MG/5ML PO SUSP: 30.0000 mL | Freq: Every day | ORAL | Status: DC | PRN

## 2015-07-04 MED ORDER — ALUM & MAG HYDROXIDE-SIMETH 200-200-20 MG/5ML PO SUSP: 30.0000 mL | ORAL | Status: DC | PRN

## 2015-07-04 MED ORDER — LEVOTHYROXINE SODIUM 50 MCG PO TABS
50.0000 ug | ORAL_TABLET | Freq: Every day | ORAL | Status: DC
  Administered 2015-07-04 – 2015-07-09 (×6): 50 ug via ORAL
  Filled 2015-07-04 (×6): qty 1

## 2015-07-04 MED ORDER — PALIPERIDONE ER 3 MG PO TB24
6.0000 mg | ORAL_TABLET | Freq: Every day | ORAL | Status: DC
  Administered 2015-07-04: 6 mg via ORAL
  Filled 2015-07-04: qty 2

## 2015-07-04 NOTE — Plan of Care (Signed)
Problem: Ineffective individual coping Goal: STG: Pt will be able to identify effective and ineffective STG: Pt will be able to identify effective and ineffective coping patterns  Outcome: Not Progressing Patient new to unit and need to learn effective coping strategies. Goal: STG: Patient will participate in after care plan Outcome: Not Progressing Patient will participate in aftercare plans in order to increase med compliance post discharge.

## 2015-07-04 NOTE — [facility] Group Notes (Signed)
[facility]   07/04/2015 12:48 PM  Participation Quality:  Active   Mood/Affect:  Labile  Depression Rating:  "very bad"   Anxiety Rating:  "very bad"   Thoughts of Suicide:  No Will you contract for safety?   NA  Current AVH:  No  Plan for Discharge/Comments:  "I can't make it out there." Pt reports he lives in his own apartment but reports he does not like it.   Transportation Means: Bus   Supports: Geologist, [facility]   Justin Weeks MSW, 270[address] Archer RdCSWA

## 2015-07-04 NOTE — Tx Team (Signed)
Initial Interdisciplinary Treatment Plan   PATIENT STRESSORS: Financial difficulties Health problems Medication change or noncompliance   PATIENT STRENGTHS: Active sense of humor Capable of independent living Supportive family/friends   PROBLEM LIST: Problem List/Patient Goals Date to be addressed Date deferred Reason deferred Estimated date of resolution  Medication noncompliance 07/03/15     Diabetes 07/03/15     Labile Mood 07/03/15     Tobacco use 07/03/15                                    DISCHARGE CRITERIA:  Improved stabilization in mood, thinking, and/or behavior Verbal commitment to aftercare and medication compliance  PRELIMINARY DISCHARGE PLAN: Attend aftercare/continuing care group Outpatient therapy Return to previous living arrangement  PATIENT/FAMIILY INVOLVEMENT: This treatment plan has been presented to and reviewed with the patient, Justin Weeks, and/or family member.  The patient and family have been given the opportunity to ask questions and make suggestions.  Rainey Kahrs E Hiroyuki Ozanich 07/04/2015, 3:08 AM

## 2015-07-04 NOTE — BHH Group Notes (Signed)
[facility] Group Notes:  (Nursing/MHT/Case Management/Adjunct)  Date:  07/04/2015  Time:  11:59 AM  Type of Therapy:  Psychoeducational Skills  Participation Level:  Active  Participation Quality:  Intrusive and Sharing  Affect:  Excited  Cognitive:  Appropriate  Insight:  Appropriate  Engagement in Group:  Engaged  Modes of Intervention:  Support  Summary of Progress/Problems:  Murrell ReddenOlivia Briana Morgan-Little 07/04/2015, 11:59 AM

## 2015-07-04 NOTE — Plan of Care (Signed)
Problem: Ineffective individual coping Goal: STG: Pt will be able to identify effective and ineffective STG: Pt will be able to identify effective and ineffective coping patterns  Outcome: Progressing Attending unit programing redirect to positive influence

## 2015-07-04 NOTE — Progress Notes (Signed)
D: Patient is [age] y/o AA male who presented to the ED with manic behaviors due to being non medication compliant. [facility]. Patient is alert oriented x3. Patient has a history of schizoaffective behaviors. Patient denies any SI/HI/AVH during the shift. Patient currently resides in independent living apartment alone. [facility]. Patient cooperative during the admission but does talk loudly and tends to have tangential thinking.   A: Patient arrived on the unit and searched with no contraband found. Skin assessment complete. Patient given a tour of the unit. Patient given a meal tray.  R: Patient voiced no other concerns.

## 2015-07-04 NOTE — H&P (Signed)
Psychiatric Admission Assessment Adult  Patient Identification: Justin Weeks MRN:  [id number] Date of Evaluation:  07/04/2015 Chief Complaint:  schizoaffective disorder Principal Diagnosis: Schizoaffective disorder, bipolar type (HCC) Diagnosis:   Patient Active Problem List   Diagnosis Date Noted  . Schizoaffective disorder, bipolar type (HCC) [F25.0] 07/04/2015  . HTN (hypertension) [I10] 07/04/2015  . Dyslipidemia [E78.5] 07/04/2015  . Tobacco use disorder [F17.200] 07/04/2015  . Noncompliance [Z91.19] 07/03/2015  . Hypothyroidism [E03.9] 07/03/2015  . Diabetes (HCC) [E11.9] 04/20/2015   History of Present Illness:   Identifying data. Justin Weeks is a 54 year old male with a history of schizoaffective disorder.  Chief complaint. "I am not taking a horse pill."  History of present illness. Information was obtained from the patient the chart and his ACT team. Justin Weeks has a long history of schizophrenia but has been able to maintain independent living for years. He is in the care of ACT teem and relatively stable on a combination of Depakote and [place] injections. His treatment team noticed that the patient has been declining over the past several months becoming increasingly psychotic and disorganized. They suspected medication noncompliance. Indeed his Depakote level on admission was very low. The patient has been maintained on vague assess and I injections. Last injection 156 mg was given on October 26. On admission the patient was disorganized, unable to answer simple questions. He was also allowed and agitated and manic. She is very adamant about not taking medication especially the horse pill which I suspect is Depakote. He complains of hallucinations. He denies any symptoms of depression or anxiety. No substances are involved.  PAST PSYCHIATRIC HISTORY: He was diagnosed with schizophrenia in the 42s. He was [facility] three [facility] at [facility] and  [facility] in January 2016. He denies suicide attempts. He denies substance abuse.   FAMILY PSYCHIATRIC HISTORY: None reported.   SOCIAL HISTORY: Went to 11th grade. He now lives independently. 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   Total Time spent with patient: 1 hour  Past Psychiatric History: Schizoaffective disorder.  Risk to Self: Is patient at risk for suicide?: No Risk to Others:   Prior Inpatient Therapy:   Prior Outpatient Therapy:    Alcohol Screening: 1. How often do you have a drink containing alcohol?: Never 2. How many drinks containing alcohol do you have on a typical day when you are drinking?: 1 or 2 3. How often do you have six or more drinks on one occasion?: Never Preliminary Score: 0 4. How often during the last year have you found that you were not able to stop drinking once you had started?: Never 5. How often during the last year have you failed to do what was normally expected from you becasue of drinking?: Never 6. How often during the last year have you needed a first drink in the morning to get yourself going after a heavy drinking session?: Never 7. How often during the last year have you had a feeling of guilt of remorse after drinking?: Never 8. How often during the last year have you been unable to remember what happened the night before because you had been drinking?: Never 9. Have you or someone else been injured as a result of your drinking?: No 10. Has a relative or friend or a doctor or another health worker been concerned about your drinking or suggested you cut down?: No Alcohol Use Disorder Identification Test Final Score (AUDIT): 0 Brief Intervention: AUDIT score less  than 7 or less-screening does not suggest unhealthy drinking-brief intervention not indicated Substance Abuse History in the last 12 months:  No. Consequences of Substance Abuse: NA Previous Psychotropic Medications: Yes  Psychological Evaluations: No  Past Medical History:   Past Medical History  Diagnosis Date  . Arthritis   . Asthma   . Blood transfusion without reported diagnosis   . Diabetes mellitus without complication (HCC)   . Hypertension   . Seizures (HCC)   . Personal history of tobacco use, presenting hazards to health 05/31/2015  . Hypothyroid 07/03/2015   History reviewed. No pertinent past surgical history. Family History: History reviewed. No pertinent family history. Family Psychiatric  History: None reported. Social History:  History  Alcohol Use No     History  Drug Use Not on file    Social History   Social History  . Marital Status: Divorced    Spouse Name: N/A  . Number of Children: N/A  . Years of Education: N/A   Social History Main Topics  . Smoking status: Current Every Day Smoker -- 1.00 packs/day for 40 years  . Smokeless tobacco: None  . Alcohol Use: No  . Drug Use: None  . Sexual Activity: Yes   Other Topics Concern  . None   Social History Narrative   Additional Social History:                         Allergies:   Allergies  Allergen Reactions  . 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     Lab Results:  Results for orders placed or performed during the hospital encounter of 07/03/15 (from the past 48 hour(s))  Glucose, capillary     Status: Abnormal   Collection Time: 07/04/15  6:39 AM  Result Value Ref Range   Glucose-Capillary 123 (H) 65 - 99 mg/dL  Lipid panel, fasting     Status: Abnormal   Collection Time: 07/04/15  7:27 AM  Result Value Ref Range   Cholesterol 113 0 - 200 mg/dL   Triglycerides 161 <096 mg/dL   HDL 30 (L) >04 mg/dL   Total CHOL/HDL Ratio 3.8 RATIO   VLDL 24 0 - 40 mg/dL   LDL Cholesterol 59 0 - 99 mg/dL    Comment:        Total Cholesterol/HDL:CHD Risk Coronary Heart Disease Risk Table                     Men   Women  1/2 Average Risk   3.4   3.3  Average Risk        5.0   4.4  2 X Average Risk   9.6   7.1  3 X Average Risk  23.4   11.0        Use the calculated Patient Ratio above and the CHD Risk Table to determine the patient's CHD Risk.        ATP III CLASSIFICATION (LDL):  <100     mg/dL   Optimal  540-981  mg/dL   Near or Above                    Optimal  130-159  mg/dL   Borderline  191-478  mg/dL   High  >295     mg/dL   Very High   TSH  Status: None   Collection Time: 07/04/15  7:27 AM  Result Value Ref Range   TSH 1.063 0.350 - 4.500 uIU/mL    Metabolic Disorder Labs:  Lab Results  Component Value Date   HGBA1C 9.3* 09/21/2014   No results found for: PROLACTIN Lab Results  Component Value Date   CHOL 113 07/04/2015   TRIG 118 07/04/2015   HDL 30* 07/04/2015   CHOLHDL 3.8 07/04/2015   VLDL 24 07/04/2015   LDLCALC 59 07/04/2015   LDLCALC 41 09/21/2014    Current Medications: Current Facility-Administered Medications  Medication Dose Route Frequency Provider Last Rate Last Dose  . acetaminophen (TYLENOL) tablet 650 mg  650 mg Oral Q6H PRN Audery AmelJohn T Clapacs, MD      . alum & mag hydroxide-simeth (MAALOX/MYLANTA) 200-200-20 MG/5ML suspension 30 mL  30 mL Oral Q4H PRN Audery AmelJohn T Clapacs, MD      . clonazePAM Scarlette Calico(KLONOPIN) tablet 0.5 mg  0.5 mg Oral BID Audery AmelJohn T Clapacs, MD   0.5 mg at 07/04/15 0953  . divalproex (DEPAKOTE) DR tablet 500 mg  500 mg Oral Q12H Audery AmelJohn T Clapacs, MD   500 mg at 07/04/15 0953  . glipiZIDE (GLUCOTROL XL) 24 hr tablet 10 mg  10 mg Oral Q breakfast Audery AmelJohn T Clapacs, MD   10 mg at 07/04/15 0805  . [START ON 07/05/2015] Influenza vac split quadrivalent PF (FLUARIX) injection 0.5 mL  0.5 mL Intramuscular Tomorrow-1000 Catera Hankins B Lateef Juncaj, MD      . levothyroxine (SYNTHROID, LEVOTHROID) tablet 50 mcg  50 mcg Oral QAC breakfast Audery AmelJohn T Clapacs, MD   50 mcg at 07/04/15 0805  . linagliptin (TRADJENTA) tablet 5 mg  5 mg Oral Daily Audery AmelJohn T Clapacs, MD   5 mg at 07/04/15 [id number]  . lisinopril (PRINIVIL,ZESTRIL) tablet 5 mg  5 mg  Oral Daily Audery AmelJohn T Clapacs, MD   5 mg at 07/04/15 [id number]  . magnesium hydroxide (MILK OF MAGNESIA) suspension 30 mL  30 mL Oral Daily PRN Audery AmelJohn T Clapacs, MD      . metFORMIN (GLUCOPHAGE) tablet 1,000 mg  1,000 mg Oral BID WC Audery AmelJohn T Clapacs, MD   1,000 mg at 07/04/15 0805  . paliperidone (INVEGA) 24 hr tablet 6 mg  6 mg Oral Daily Audery AmelJohn T Clapacs, MD   6 mg at 07/04/15 [id number]  . [START ON 07/05/2015] pneumococcal 23 valent vaccine (PNU-IMMUNE) injection 0.5 mL  0.5 mL Intramuscular Tomorrow-1000 Kaleb Sek B Ronisha Herringshaw, MD      . simvastatin (ZOCOR) tablet 20 mg  20 mg Oral q1800 Audery AmelJohn T Clapacs, MD       PTA Medications: Prescriptions prior to admission  Medication Sig Dispense Refill Last Dose  . clonazePAM (KLONOPIN) 0.5 MG tablet Take 0.5 mg by mouth 2 (two) times daily.    unknown at unknown  . divalproex (DEPAKOTE ER) 500 MG 24 hr tablet Take 500-1,000 mg by mouth 2 (two) times daily. Pt takes two tablets in the morning and one tablet at bedtime.   unknown at unknown  . glipiZIDE (GLUCOTROL XL) 10 MG 24 hr tablet Take 10 mg by mouth 2 (two) times daily.   unknown at unknown  . levothyroxine (SYNTHROID, LEVOTHROID) 50 MCG tablet Take 50 mcg by mouth daily.    unknown at unknown  . metFORMIN (GLUCOPHAGE-XR) 500 MG 24 hr tablet Take 1,000 mg by mouth 2 (two) times daily.    unknown at unknown  . paliperidone (INVEGA SUSTENNA) 156 MG/ML SUSP injection Inject 156 mg into  the muscle every 30 (thirty) days.   unknown at unknown  . quinapril (ACCUPRIL) 5 MG tablet Take 5 mg by mouth daily.    unknown at unknown  . risperiDONE (RISPERDAL) 3 MG tablet Take 6 mg by mouth at bedtime.    unknown at unknown  . risperiDONE microspheres (RISPERDAL CONSTA) 50 MG injection Inject 50 mg into the muscle every 14 (fourteen) days.   unknown at unknown  . simvastatin (ZOCOR) 20 MG tablet Take 20 mg by mouth at bedtime.    unknown at unknown  . sitaGLIPtin (JANUVIA) 100 MG tablet Take 100 mg by mouth daily.   unknown at  unknown    Musculoskeletal: Strength & Muscle Tone: within normal limits Gait & Station: normal Patient leans: N/A  Psychiatric Specialty Exam: Physical Exam  Nursing note and vitals reviewed.   Review of Systems  All other systems reviewed and are negative.   Blood pressure 108/75, pulse 92, temperature 97.8 F (36.6 C), temperature source Oral, resp. rate 18, height  (1.727 m), weight 72.576 kg (160 lb), SpO2 100 %.Body mass index is 24.33 kg/(m^2).  See SRA.                                                  Sleep:  Number of Hours: 5.5     Treatment Plan Summary: Daily contact with patient to assess and evaluate symptoms and progress in treatment and Medication management   Justin Weeks is a 54 year old male with a history of schizoaffective disorder admitted for psychotic manic episode in the context of medication noncompliance.  1. Agitated behavior. At this has resolved.  2. Mood and psychosis. He was restarted on Depakote for mood stabilization and Invega sustenna for psychosis. His last injection of 156 mg was given on 10/26. We will increase oral Invega to 9 mg daily.  3. Diabetes. We will continue glipizide and metformin.  4. Hypertension. We will continue lisinopril.  5. Dyslipidemia. We'll continue Zocor.  6. Hypothyroidism. We'll continue Synthroid.  7. Anxiety. We'll continue low-dose clonazepam.   8. Smoking. Nicotine patch is available.   9. Disposition. He will be discharged back to his apartment. He will follow up with [facility].   Observation Level/Precautions:  15 minute checks  Laboratory:  CBC Chemistry Profile UDS UA  Psychotherapy:    Medications:    Consultations:    Discharge Concerns:    Estimated LOS:  Other:     I certify that inpatient services furnished can reasonably be expected to improve the patient's condition.   Sharetha Newson 11/2/201610:50 AM

## 2015-07-04 NOTE — [facility] Suicide Risk Assessment (Addendum)
[facility] Admission Suicide Risk Assessment   Nursing information obtained from:    Demographic factors:    Current Mental Status:    Loss Factors:    Historical Factors:    Risk Reduction Factors:    Total Time spent with patient: 1 hour Principal Problem: Schizoaffective disorder, bipolar type (HCC) Diagnosis:   Patient Active Problem List   Diagnosis Date Noted  . Schizoaffective disorder, bipolar type (HCC) [F25.0] 07/04/2015  . HTN (hypertension) [I10] 07/04/2015  . Dyslipidemia [E78.5] 07/04/2015  . Tobacco use disorder [F17.200] 07/04/2015  . Noncompliance [Z91.19] 07/03/2015  . Hypothyroidism [E03.9] 07/03/2015  . Diabetes (HCC) [E11.9] 04/20/2015     Continued Clinical Symptoms:  Alcohol Use Disorder Identification Test Final Score (AUDIT): 0 The "Alcohol Use Disorders Identification Test", Guidelines for Use in Primary Care, Second Edition.  World Science writer [facility]). Score between 0-7:  no or low risk or alcohol related problems. Score between 8-15:  moderate risk of alcohol related problems. Score between 16-19:  high risk of alcohol related problems. Score 20 or above:  warrants further diagnostic evaluation for alcohol dependence and treatment.   CLINICAL FACTORS:   Schizophrenia:   Paranoid or undifferentiated type   Musculoskeletal: Strength & Muscle Tone: within normal limits Gait & Station: normal Patient leans: N/A  Psychiatric Specialty Exam: I reviewed physical exam performed in the emergency room and agree with the findings. Physical Exam  Nursing note and vitals reviewed.   Review of Systems  All other systems reviewed and are negative.   Blood pressure 108/75, pulse 92, temperature 97.8 F (36.6 C), temperature source Oral, resp. rate 18, height  (1.727 m), weight 72.576 kg (160 lb), SpO2 100 %.Body mass index is 24.33 kg/(m^2).  General Appearance: Disheveled  Eye Solicitor::  Fair  Speech:  Pressured  Volume:  Increased  Mood:   Angry, Dysphoric and Irritable  Affect:  Congruent  Thought Process:  Disorganized  Orientation:  Full (Time, Place, and Person)  Thought Content:  Delusions, Hallucinations: Auditory and Paranoid Ideation  Suicidal Thoughts:  No  Homicidal Thoughts:  No  Memory:  Immediate;   Fair Recent;   Fair Remote;   Fair  Judgement:  Poor  Insight:  Fair and Lacking  Psychomotor Activity:  Increased  Concentration:  Fair  Recall:  [employer] of Knowledge:Fair  Language: Fair  Akathisia:  No  Handed:  Right  AIMS (if indicated):     Assets:  Communication Skills Desire for Improvement Financial Resources/Insurance Housing Physical Health Resilience Social Support  Sleep:  Number of Hours: 5.5  Cognition: WNL  ADL's:  Intact     COGNITIVE FEATURES THAT CONTRIBUTE TO RISK:  None    SUICIDE RISK:   Mild:  Suicidal ideation of limited frequency, intensity, duration, and specificity.  There are no identifiable plans, no associated intent, mild dysphoria and related symptoms, good self-control (both objective and subjective assessment), few other risk factors, and identifiable protective factors, including available and accessible social support.  PLAN OF CARE: Hospital admission, medication management, discharge planning.  Medical Decision Making:  New problem, with additional work up planned, Review of Psycho-Social Stressors (1), Review or order clinical lab tests (1), Review of Medication Regimen & Side Effects (2) and Review of New Medication or Change in Dosage (2)   Justin Weeks is a 54 year old male with a history of schizoaffective disorder admitted for psychotic manic episode in the context of medication noncompliance.  1. Agitated behavior. At this has resolved.  2. Mood and psychosis. He was restarted on Depakote for mood stabilization and in [place] for psychosis. I will contact Justin Weeks to learn when his next injection of [place]vega Sustenna is due. We will give it in the  hospital if necessary.  3. Diabetes. We will continue glipizide and metformin.  4. Hypertension. We will continue lisinopril.  5. Dyslipidemia. We'll continue Zocor.  6. Hypothyroidism. We'll continue Synthroid.  7. Anxiety. We'll continue low-dose clonazepam.   8. Smoking. Nicotine patch is available.   9. Disposition. He will be discharged back to his apartment. He will [facility].       I certify that inpatient services furnished can reasonably be expected to improve the patient's condition.   Justin Weeks 07/04/2015, 10:44 AM

## 2015-07-04 NOTE — Progress Notes (Signed)
D: Patient remains in scrubs , No  ADL 'S this shift  normal gait  Participatory with unit programing .Patient very loud with his demands  . Questioned if he was hearing voices  Patient denies Affect pleasant  on approach voice he needed to be here because of his medication. Redirected on unit  Cooperative with directions  A: Encourage patient to come to staff for any concerns or issues needing to be addressed . Instructions given on medication , verbalize understanding. Writer addressing issues R: Voice no other concerns , staff continue tor monitor .

## 2015-07-04 NOTE — Progress Notes (Signed)
Recreation Therapy Notes  Date: 11.02.16 Time: 3:00 pm Location: Craft Room  Group Topic: Self-esteem  Goal Area(s) Addresses:  Patient will identify at least one positive attribute about self. Patient will identify at least one coping skill.  Behavioral Response: Intermittently attentive, Disruptive  Intervention: All About Me  Activity: Patients were instructed to make an All About Me pamphlet listing their life motto, positive traits, healthy coping skills, and their healthy support system.  Education: LRT educated patients on ways to increase their self-esteem.  Education Outcome: In group clarification offered  Clinical Observations/Feedback: Patient left group at approximately 3:05 pm with MHT. Patient return to group at approximately 3:14 pm. LRT explained activity. Patient left group at 3:15 pm to meet with Dr. Demetrius CharityP. Patient returned to group at approximately 3:29 pm. Patient would talk loudly and interrupt during group discussion. LRT redirected patient. Patient compliant.  Justin Weeks,Justin Weeks, LRT/CTRS 07/04/2015 4:19 PM

## 2015-07-05 DIAGNOSIS — F250 Schizoaffective disorder, bipolar type: Secondary | ICD-10-CM | POA: Diagnosis not present

## 2015-07-05 LAB — GLUCOSE, CAPILLARY
GLUCOSE-CAPILLARY: 161 mg/dL — AB (ref 65–99)
Glucose-Capillary: 141 mg/dL — ABNORMAL HIGH (ref 65–99)
Glucose-Capillary: 124 mg/dL — ABNORMAL HIGH (ref 65–99)

## 2015-07-05 NOTE — Progress Notes (Signed)
Observed resting in bed

## 2015-07-05 NOTE — [facility] Group Notes (Signed)
[facility] Group Notes:  (Nursing/MHT/Case Management/Adjunct)  Date:  07/05/2015  Time:  2:27 PM  Type of Therapy:  Group Therapy  Participation Level:  Active  Participation Quality:  Appropriate, Attentive and Sharing  Affect:  Appropriate  Cognitive:  Alert, Appropriate and Oriented  Insight:  Appropriate  Engagement in Group:  Engaged  Modes of Intervention:  Activity  Summary of Progress/Problems:  Justin Weeks 07/05/2015, 2:27 PM

## 2015-07-05 NOTE — Progress Notes (Signed)
[facility] MD Progress Note  07/05/2015 2:27 PM Justin Weeks  MRN:  [id number]  Subjective:  Justin Weeks is still loud and exuberant but slightly less intrusive. He met with his act team representative today. He no longer wants to go to [place] and is eager to be discharged to home. He takes his medications and tolerates them well. There are no somatic complaints. He is out in the community. He participates in programming although is disruptive in groups.  Principal Problem: Schizoaffective disorder, bipolar type ([place]) Diagnosis:   Patient Active Problem List   Diagnosis Date Noted  . Schizoaffective disorder, bipolar type ([place]) [F25.0] 07/04/2015  . HTN (hypertension) [I10] 07/04/2015  . Dyslipidemia [E78.5] 07/04/2015  . Tobacco use disorder [F17.200] 07/04/2015  . Noncompliance [Z91.19] 07/03/2015  . Hypothyroidism [E03.9] 07/03/2015  . Diabetes ([place]) [E11.9] 04/20/2015   Total Time spent with patient: 20 minutes  Past Psychiatric History: Schizoaffective disorder bipolar type.  Past Medical History:  Past Medical History  Diagnosis Date  . Arthritis   . Asthma   . Blood transfusion without reported diagnosis   . Diabetes mellitus without complication ([place])   . Hypertension   . Seizures ([place])   . Personal history of tobacco use, presenting hazards to health 05/31/2015  . Hypothyroid 07/03/2015   History reviewed. No pertinent past surgical history. Family History: History reviewed. No pertinent family history. Family Psychiatric  History: None reported. Social History:  History  Alcohol Use No     History  Drug Use Not on file    Social History   Social History  . Marital Status: Divorced    Spouse Name: N/A  . Number of Children: N/A  . Years of Education: N/A   Social History Main Topics  . Smoking status: Current Every Day Smoker -- 1.00 packs/day for 40 years  . Smokeless tobacco: None  . Alcohol Use: No  . Drug Use: None  . Sexual Activity: Yes   Other  Topics Concern  . None   Social History Narrative   Additional Social History:                         Sleep: Good  Appetite:  Good  Current Medications: Current Facility-Administered Medications  Medication Dose Route Frequency Provider Last Rate Last Dose  . acetaminophen (TYLENOL) tablet 650 mg  650 mg Oral Q6H PRN Gonzella Lex, MD      . alum & mag hydroxide-simeth (MAALOX/MYLANTA) 200-200-20 MG/5ML suspension 30 mL  30 mL Oral Q4H PRN Gonzella Lex, MD      . clonazePAM Bobbye Charleston) tablet 0.5 mg  0.5 mg Oral BID Gonzella Lex, MD   0.5 mg at 07/05/15 0933  . divalproex (DEPAKOTE) DR tablet 500 mg  500 mg Oral 3 times per day Clovis Fredrickson, MD   500 mg at 07/05/15 1401  . glipiZIDE (GLUCOTROL XL) 24 hr tablet 10 mg  10 mg Oral Q breakfast Gonzella Lex, MD   10 mg at 07/05/15 0824  . levothyroxine (SYNTHROID, LEVOTHROID) tablet 50 mcg  50 mcg Oral QAC breakfast Gonzella Lex, MD   50 mcg at 07/05/15 0519  . linagliptin (TRADJENTA) tablet 5 mg  5 mg Oral Daily Gonzella Lex, MD   5 mg at 07/05/15 0933  . lisinopril (PRINIVIL,ZESTRIL) tablet 5 mg  5 mg Oral Daily Gonzella Lex, MD   5 mg at 07/05/15 0933  . magnesium hydroxide (MILK  OF MAGNESIA) suspension 30 mL  30 mL Oral Daily PRN Gonzella Lex, MD      . metFORMIN (GLUCOPHAGE) tablet 1,000 mg  1,000 mg Oral BID WC Gonzella Lex, MD   1,000 mg at 07/05/15 0824  . nicotine (NICODERM CQ - dosed in mg/24 hours) patch 21 mg  21 mg Transdermal Daily Jolanta B Pucilowska, MD   21 mg at 07/05/15 0932  . [START ON 07/25/2015] paliperidone (INVEGA SUSTENNA) injection 234 mg  234 mg Intramuscular Once Jolanta B Pucilowska, MD      . paliperidone (INVEGA) 24 hr tablet 9 mg  9 mg Oral Daily Jolanta B Pucilowska, MD   9 mg at 07/05/15 0933  . simvastatin (ZOCOR) tablet 20 mg  20 mg Oral q1800 Gonzella Lex, MD   20 mg at 07/04/15 1710    Lab Results:  Results for orders placed or performed during the hospital  encounter of 07/03/15 (from the past 48 hour(s))  Glucose, capillary     Status: Abnormal   Collection Time: 07/04/15  6:39 AM  Result Value Ref Range   Glucose-Capillary 123 (H) 65 - 99 mg/dL  Hemoglobin A1c     Status: Abnormal   Collection Time: 07/04/15  7:27 AM  Result Value Ref Range   Hgb A1c MFr Bld 6.5 (H) 4.0 - 6.0 %  Lipid panel, fasting     Status: Abnormal   Collection Time: 07/04/15  7:27 AM  Result Value Ref Range   Cholesterol 113 0 - 200 mg/dL   Triglycerides 118 <150 mg/dL   HDL 30 (L) >40 mg/dL   Total CHOL/HDL Ratio 3.8 RATIO   VLDL 24 0 - 40 mg/dL   LDL Cholesterol 59 0 - 99 mg/dL    Comment:        Total Cholesterol/HDL:CHD Risk Coronary Heart Disease Risk Table                     Men   Women  1/2 Average Risk   3.4   3.3  Average Risk       5.0   4.4  2 X Average Risk   9.6   7.1  3 X Average Risk  23.4   11.0        Use the calculated Patient Ratio above and the CHD Risk Table to determine the patient's CHD Risk.        ATP III CLASSIFICATION (LDL):  <100     mg/dL   Optimal  100-129  mg/dL   Near or Above                    Optimal  130-159  mg/dL   Borderline  160-189  mg/dL   High  >190     mg/dL   Very High   TSH     Status: None   Collection Time: 07/04/15  7:27 AM  Result Value Ref Range   TSH 1.063 0.350 - 4.500 uIU/mL  Glucose, capillary     Status: Abnormal   Collection Time: 07/04/15 12:01 PM  Result Value Ref Range   Glucose-Capillary 136 (H) 65 - 99 mg/dL  Glucose, capillary     Status: Abnormal   Collection Time: 07/04/15  4:52 PM  Result Value Ref Range   Glucose-Capillary 130 (H) 65 - 99 mg/dL   Comment 1 Notify RN   Glucose, capillary     Status: Abnormal   Collection Time: 07/04/15  9:09 PM  Result Value Ref Range   Glucose-Capillary 113 (H) 65 - 99 mg/dL   Comment 1 Notify RN     Physical Findings: AIMS: Facial and Oral Movements Muscles of Facial Expression: None, normal Lips and Perioral Area: None,  normal Jaw: None, normal Tongue: None, normal,Extremity Movements Upper (arms, wrists, hands, fingers): None, normal Lower (legs, knees, ankles, toes): None, normal, Trunk Movements Neck, shoulders, hips: None, normal, Overall Severity Severity of abnormal movements (highest score from questions above): None, normal Incapacitation due to abnormal movements: None, normal Patient's awareness of abnormal movements (rate only patient's report): No Awareness, Dental Status Current problems with teeth and/or dentures?: Yes Does patient usually wear dentures?: Yes  CIWA:  CIWA-Ar Total: 0 COWS:  COWS Total Score: 2  Musculoskeletal: Strength & Muscle Tone: within normal limits Gait & Station: normal Patient leans: N/A  Psychiatric Specialty Exam: Review of Systems  All other systems reviewed and are negative.   Blood pressure 130/85, pulse 76, temperature 98.5 F (36.9 C), temperature source Oral, resp. rate 18, height '5\' 8"'  (1.727 m), weight 72.576 kg (160 lb), SpO2 99 %.Body mass index is 24.33 kg/(m^2).  General Appearance: Casual  Eye Contact::  Good  Speech:  Pressured  Volume:  Increased  Mood:  Euphoric  Affect:  Labile  Thought Process:  Disorganized  Orientation:  Full (Time, Place, and Person)  Thought Content:  Delusions and Paranoid Ideation  Suicidal Thoughts:  No  Homicidal Thoughts:  No  Memory:  Immediate;   Fair Recent;   Fair Remote;   Fair  Judgement:  Impaired  Insight:  Shallow  Psychomotor Activity:  Increased  Concentration:  Fair  Recall:  [employer] of Knowledge:Fair  Language: Fair  Akathisia:  No  Handed:  Right  AIMS (if indicated):     Assets:  Communication Skills Desire for Improvement Financial Resources/Insurance Housing Resilience Social Support  ADL's:  Intact  Cognition: WNL  Sleep:  Number of Hours: 5.5   Treatment Plan Summary: Daily contact with patient to assess and evaluate symptoms and progress in treatment and Medication  management   Mr. Wenke is a 54 year old male with a history of schizoaffective disorder admitted for psychotic manic episode in the context of medication noncompliance.  1. Agitated behavior. At this has resolved.  2. Mood and psychosis. He was restarted on Depakote for mood stabilization and Invega sustenna for psychosis. His last injection of 156 mg was given on 10/26. We will increase oral Invega to 9 mg daily. VPA level in am.  3. Diabetes. We will continue glipizide, metformin, ADA diet and blood glucose monitoring. HgbA1C 6.5.  4. Hypertension. We will continue lisinopril. Lipid panel not elevated.  5. Dyslipidemia. We'll continue Zocor.  6. Hypothyroidism. We'll continue Synthroid.  7. Anxiety. We'll continue low-dose clonazepam.   8. Smoking. Nicotine patch is available.   9. Disposition. He will be discharged back to his apartment. He will follow up with [name].   Orson Slick 07/05/2015, 2:27 PM

## 2015-07-05 NOTE — Plan of Care (Signed)
Problem: Ineffective individual coping Goal: STG: Patient will remain free from self harm Outcome: Progressing Medications administered as ordered by the physician, medications Therapeutic Effects, SEs and Adverse effects discussed, questions encouraged; no PRN given, 15 minute checks maintained for safety, clinical and moral support provided, patient encouraged to continue to express feelings and demonstrate safe care. Patient remain free from harm, will continue to monitor.         

## 2015-07-05 NOTE — Plan of Care (Signed)
Problem: Education: Goal: Ability to describe self-care measures that may prevent or decrease complications (Diabetes Survival Skills Education) will improve Outcome: Progressing Discuss health benefits  Associated with illness

## 2015-07-05 NOTE — [facility] Group Notes (Signed)
[facility] Group Notes:  (Nursing/MHT/Case Management/Adjunct)  Date:  07/05/2015  Time:  7:15 AM  Type of Therapy:  Group Therapy  Participation Level:  Active  Participation Quality:  Appropriate  Affect:  Appropriate  Cognitive:  Appropriate  Insight:  Appropriate  Engagement in Group:  Engaged  Modes of Intervention:  n/a  Summary of Progress/Problems:  Veva Holesshley Imani Gilbert Narain 07/05/2015, 7:15 AM

## 2015-07-05 NOTE — Progress Notes (Signed)
D: Patient wearing his normal clothes, normal gait  Participatory with unit programing Affect pleasant  on approach . Ambivalent about going home  remans manic  Compliant with  medication  A: Encourage patient to come to staff for any concerns or issues needing to be addressed . Instructions given on medication , verbalize understanding. Writer addressing issues R: Voice no other concerns , staff continue tor monitor .

## 2015-07-05 NOTE — Progress Notes (Signed)
Recreation Therapy Notes  Date: 11.03.16 Time: 3:00 pm Location: Craft Room  Group Topic: Leisure Education  Goal Area(s) Addresses:  Patient will identify things they are grateful for. Patient will identify how being grateful can influence decision making.  Behavioral Response: Attentive, Interactive  Intervention: Grateful Wheel  Activity: Patients were given an I am Grateful For worksheet and instructed to think of 2-3 things they were grateful for under each category.  Education: LRT educated patients on why it is important to be grateful.  Education Outcome: In group clarification offered  Clinical Observations/Feedback: Patient completed activity by writing things he was grateful for with assistance from LRT. Patient contributed to group discussion by stating things he was grateful for.  Jacquelynn CreeGreene,Minna Dumire M, LRT/CTRS 07/05/2015 4:16 PM

## 2015-07-05 NOTE — Tx Team (Signed)
Interdisciplinary Treatment Plan Update (Adult)  Date:  07/05/2015 Time Reviewed:  4:11 PM  Progress in Treatment: Attending groups: Yes. Participating in groups:  Yes. Taking medication as prescribed:  Yes. Tolerating medication:  Yes. Family/Significant othe contact made:  Yes, individual(s) contacted:  ACT Team Patient understands diagnosis:  Yes. Discussing patient identified problems/goals with staff:  Yes. Medical problems stabilized or resolved:  Yes. Denies suicidal/homicidal ideation: Yes. Issues/concerns per patient self-inventory:  Yes. Other:  New problem(s) identified: No, Describe:  NA  Discharge Plan or Barriers: Pt plans to return home and follow up with outpatient.    Reason for Continuation of Hospitalization: Mania Medication stabilization  Comments:Justin Weeks is still loud and exuberant but slightly less intrusive. He met with his act team representative today. He no longer wants to go to [place] and is eager to be discharged to home. He takes his medications and tolerates them well. There are no somatic complaints. He is out in the community. He participates in programming although is disruptive in groups.  Estimated length of stay: Possible d/c on Monday   New goal(s): NA  Review of initial/current patient goals per problem list:   1.  Goal(s): Patient will participate in aftercare plan * Met:  * Target date: at discharge * As evidenced by: Patient will participate within aftercare plan AEB aftercare provider and housing plan at discharge being identified.  2.  Goal (s): Patient will demonstrate decreased symptoms of Mania. * Met: No  *  Target date: at discharge * As evidenced by: Patient will not endorse signs of mania or be deemed stable for discharge by MD.   Attendees: Patient:  Justin Weeks 11/3/20164:11 PM  Family:   11/3/20164:11 PM  Physician:   Dr. Bary Leriche  11/3/20164:11 PM  Nursing:   Meredith Mody, RN  11/3/20164:11 PM  Case Manager:    11/3/20164:11 PM  Counselor:   11/3/20164:11 PM  Other:  Wray Kearns, LCSWA 11/3/20164:11 PM  Other:  Everitt Amber, [place]  11/3/20164:11 PM  Other:   11/3/20164:11 PM  Other:  11/3/20164:11 PM  Other:  11/3/20164:11 PM  Other:  11/3/20164:11 PM  Other:  11/3/20164:11 PM  Other:  11/3/20164:11 PM  Other:  11/3/20164:11 PM  Other:   11/3/20164:11 PM   Scribe for Treatment Team:   Wray Kearns MSW, [place] , 07/05/2015, 4:11 PM

## 2015-07-05 NOTE — Progress Notes (Signed)
A&Ox3, VSS, denied painanticipated course of care provided, receptive, voice volume not as loud, considerate of others, "I don't belong here, I suppose to be here for medication review ..." Behavior appropriate, medication compliant, will monitor for safety.

## 2015-07-06 LAB — GLUCOSE, CAPILLARY
GLUCOSE-CAPILLARY: 162 mg/dL — AB (ref 65–99)
Glucose-Capillary: 174 mg/dL — ABNORMAL HIGH (ref 65–99)
Glucose-Capillary: 313 mg/dL — ABNORMAL HIGH (ref 65–99)

## 2015-07-06 LAB — VALPROIC ACID LEVEL: Valproic Acid Lvl: 85 ug/mL (ref 50.0–100.0)

## 2015-07-06 MED ORDER — SENNOSIDES-DOCUSATE SODIUM 8.6-50 MG PO TABS
1.0000 | ORAL_TABLET | ORAL | Status: DC
  Administered 2015-07-07 – 2015-07-09 (×3): 1 via ORAL
  Filled 2015-07-06 (×3): qty 1

## 2015-07-06 MED ORDER — VALPROIC ACID 250 MG/5ML PO SYRP
500.0000 mg | ORAL_SOLUTION | Freq: Three times a day (TID) | ORAL | Status: DC
  Administered 2015-07-06 – 2015-07-09 (×9): 500 mg via ORAL
  Filled 2015-07-06 (×15): qty 10

## 2015-07-06 NOTE — [facility] Group Notes (Signed)
[facility] Group Notes:  (Nursing/MHT/Case Management/Adjunct)  Date:  07/06/2015  Time:  10:19 PM  Type of Therapy:  Group Therapy  Participation Level:  Active  Participation Quality:  Appropriate and Sharing  Affect:  Appropriate  Cognitive:  Appropriate  Insight:  Appropriate  Engagement in Group:  Engaged  Modes of Intervention:  Support  Summary of Progress/Problems:  Murrell ReddenOlivia Briana Morgan-Little 07/06/2015, 10:19 PM

## 2015-07-06 NOTE — Progress Notes (Signed)
Recreation Therapy Notes  INPATIENT RECREATION THERAPY ASSESSMENT  Patient Details Name: Justin Weeks MRN: [id number] DOB: 03/13/1961 Today's Date: 07/06/2015  Patient Stressors:  Patient reported no stressors.  Coping Skills:   Exercise, Art/Dance, Talking, Music, Sports, Other (Comment) Adriana Simas([place])  Personal Challenges:  Patient reported no personal challenges.  Leisure Interests (2+):  Individual - TV, Individual - Other (Comment) (Talk to his woman)  Awareness of Community Resources:  Yes  Community Resources:  [employer], [employer]  Current Use: No  If no, Barriers?:    Patient Strengths:  Good looking, dress well  Patient Identified Areas of Improvement:  No  Current Recreation Participation:  Go to his girls' house and watch a movie  Patient Goal for Hospitalization:  To take medications  [place]  County of Residence:  [place]   Current SI (including self-harm):  No  Current HI:  No  Consent to Intern Participation: N/A  Due to patient reported no personal challenges, LRT will not develop a Recreational Therapy Care Plan at this time. If patient's status changes, LRT will develop a Recreational Therapy Care Plan.  Jacquelynn CreeGreene,Evanthia Maund M, LRT/CTRS 07/06/2015, 12:54 PM

## 2015-07-06 NOTE — B[facility] Counselor (Signed)
Adult Comprehensive Assessment  Patient ID: Justin Weeks, male   DOB: 10/06/1960, 54 y.o.   MRN: [id number]  Information Source: Information source: Patient  Current Stressors:  Educational / Learning stressors: Did not finish middle school.  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  Family Relationships: None reported  Financial / Lack of resources (include bankruptcy): Limited income.  Housing / Lack of housing: None reported  Physical health (include injuries & life threatening diseases): None reported  Social relationships: None reported  Substance abuse: Denies use.  Bereavement / Loss: A good friend moved to [place] recently.   Living/Environment/Situation:  Living Arrangements: Alone Living conditions (as described by patient or guardian): Supportive living community through [facility].  How long has patient lived in current situation?: 6-7 years  What is atmosphere in current home: Comfortable, Supportive  Family History:  Marital status: Divorced Divorced, when?: 1983 What types of issues is patient dealing with in the relationship?: She cheated on him.  Does patient have children?: Yes How many children?: 1 How is patient's relationship with their children?: Son, good relationship.   Childhood History:  By whom was/is the patient raised?: Both parents Description of patient's relationship with caregiver when they were a child: "Wonderful! We had a good time."  Patient's description of current relationship with people who raised him/her: Mom passed away 3 years ago. Father visits him occasionally.  Does patient have siblings?: Yes Number of Siblings: 4 Description of patient's current relationship with siblings: 2 brothers, 2 sisters. They visit him on the holidays.  Did patient suffer any verbal/emotional/physical/sexual abuse as a child?: No Did patient suffer from severe childhood neglect?: No Has patient ever been sexually abused/assaulted/raped as an  adolescent or adult?: No Was the patient ever a victim of a crime or a disaster?: No Witnessed domestic violence?: No Has patient been effected by domestic violence as an adult?: No  Education:  Highest grade of school patient has completed: "middle school"  Currently a Consulting civil engineerstudent?: No Learning disability?: No  Employment/Work Situation:   Employment situation: On disability Why is patient on disability: "suicidal thoughts."  How long has patient been on disability: Since he was 54 years old.  Patient's job has been impacted by current illness: No What is the longest time patient has a held a job?: 4 years Where was the patient employed at that time?: [employer]  Has patient ever been in the Eli Lilly and Companymilitary?: No  Financial Resources:   Surveyor, quantityinancial resources: [employer] Corporationeceives SSDI, Receives SSI, [employer] EntertainmentMedicare, [employer] EnergyMedicaid, Food stamps Does patient have a representative payee or guardian?: No  Alcohol/Substance Abuse:   What has been your use of drugs/alcohol within the last 12 months?: Denies use  Alcohol/Substance Abuse Treatment Hx: Denies past history Has alcohol/substance abuse ever caused legal problems?: No  Social Support System:   Conservation officer, natureatient's Community Support System: Good Describe Community Support System: Teacher, [facility], family, friends  Type of faith/religion: Catholic  How does patient's faith help to cope with current illness?: "I would like to go to church more."   Leisure/Recreation:   Leisure and Hobbies: playing and watching sports.   Strengths/Needs:   What things does the patient do well?: saving money In what areas does patient struggle / problems for patient: taking medications, friend moving away.   Discharge Plan:   Does patient have access to transportation?: Yes Will patient be returning to same living situation after discharge?: Yes Currently receiving community mental health services: Yes (From Whom) Jeannetta Ellis([facility] ) Does patient have financial barriers related  to discharge medications?:  No  Summary/Recommendations:   Cody is a 54 year old male who presented to [facility] with mania. He reports not taking his Depakote for a few days prior to admission. He cannot state why he stopped taking his medications. However, he complained they were very large pills and would like to have liquid instead. He receives SSDI and lives in a supportive living community through [place]. He denies using drugs or alcohol. He plans to return home and follow up with his [facility] ACT team. Recommendations include; crisis stabilization, medication management, therapeutic milieu and encourage group attendance and participation.   Geni Skorupski L Hau Sanor. MSW, LCSWA  07/06/2015

## 2015-07-06 NOTE — Progress Notes (Signed)
[facility] MD Progress Note  07/06/2015 12:15 PM Justin Weeks  MRN:  [id number]  Subjective:  Justin Weeks is a 54 year old male with history of schizoaffective disorder bipolar type admitted in a manic episode in the context of treatment noncompliance. He was restarted on his medications and has been improving gradually.  Justin Weeks is very pleasant today. He is exuberant, intrusive, playful. He reports much improvement. He met with his act team yesterday and changed his mind about discharge. Initially he insisted on going to [place] but now wants to go home as soon as possible. He tolerates his medications well. He engages with his peers and staff. He participates in groups but can be disruptive and loud. There are no somatic complaints.  Principal Problem: Schizoaffective disorder, bipolar type ([place]) Diagnosis:   Patient Active Problem List   Diagnosis Date Noted  . Schizoaffective disorder, bipolar type ([place]) [F25.0] 07/04/2015  . HTN (hypertension) [I10] 07/04/2015  . Dyslipidemia [E78.5] 07/04/2015  . Tobacco use disorder [F17.200] 07/04/2015  . Noncompliance [Z91.19] 07/03/2015  . Hypothyroidism [E03.9] 07/03/2015  . Diabetes ([place]) [E11.9] 04/20/2015   Total Time spent with patient: 20 minutes  Past Psychiatric History:  Schizoaffective disorder bipolar type.  Past Medical History:  Past Medical History  Diagnosis Date  . Arthritis   . Asthma   . Blood transfusion without reported diagnosis   . Diabetes mellitus without complication ([place])   . Hypertension   . Seizures ([place])   . Personal history of tobacco use, presenting hazards to health 05/31/2015  . Hypothyroid 07/03/2015   History reviewed. No pertinent past surgical history. Family History: History reviewed. No pertinent family history. Family Psychiatric  History:  None reported. Social History:  History  Alcohol Use No     History  Drug Use Not on file    Social History   Social History  . Marital Status:  Divorced    Spouse Name: N/A  . Number of Children: N/A  . Years of Education: N/A   Social History Main Topics  . Smoking status: Current Every Day Smoker -- 1.00 packs/day for 40 years  . Smokeless tobacco: None  . Alcohol Use: No  . Drug Use: None  . Sexual Activity: Yes   Other Topics Concern  . None   Social History Narrative   Additional Social History:                         Sleep: Good  Appetite:  Good  Current Medications: Current Facility-Administered Medications  Medication Dose Route Frequency Provider Last Rate Last Dose  . acetaminophen (TYLENOL) tablet 650 mg  650 mg Oral Q6H PRN Gonzella Lex, MD      . alum & mag hydroxide-simeth (MAALOX/MYLANTA) 200-200-20 MG/5ML suspension 30 mL  30 mL Oral Q4H PRN Gonzella Lex, MD      . clonazePAM Bobbye [place]) tablet 0.5 mg  0.5 mg Oral BID Gonzella Lex, MD   0.5 mg at 07/06/15 0920  . divalproex (DEPAKOTE) DR tablet 500 mg  500 mg Oral 3 times per day Clovis Fredrickson, MD   500 mg at 07/06/15 0547  . glipiZIDE (GLUCOTROL XL) 24 hr tablet 10 mg  10 mg Oral Q breakfast Gonzella Lex, MD   10 mg at 07/06/15 0920  . levothyroxine (SYNTHROID, LEVOTHROID) tablet 50 mcg  50 mcg Oral QAC breakfast Gonzella Lex, MD   50 mcg at 07/06/15 0547  . linagliptin (TRADJENTA)  tablet 5 mg  5 mg Oral Daily Gonzella Lex, MD   5 mg at 07/06/15 0920  . lisinopril (PRINIVIL,ZESTRIL) tablet 5 mg  5 mg Oral Daily Gonzella Lex, MD   5 mg at 07/06/15 0920  . magnesium hydroxide (MILK OF MAGNESIA) suspension 30 mL  30 mL Oral Daily PRN Gonzella Lex, MD      . metFORMIN (GLUCOPHAGE) tablet 1,000 mg  1,000 mg Oral BID WC Gonzella Lex, MD   1,000 mg at 07/06/15 0920  . nicotine (NICODERM CQ - dosed in mg/24 hours) patch 21 mg  21 mg Transdermal Daily Rozanna Cormany B Lavance Beazer, MD   21 mg at 07/06/15 0920  . [START ON 07/25/2015] paliperidone (INVEGA SUSTENNA) injection 234 mg  234 mg Intramuscular Once Hernan Turnage B Mick Tanguma, MD       . paliperidone (INVEGA) 24 hr tablet 9 mg  9 mg Oral Daily Delphia Kaylor B Heiley Shaikh, MD   9 mg at 07/06/15 0920  . simvastatin (ZOCOR) tablet 20 mg  20 mg Oral q1800 Gonzella Lex, MD   20 mg at 07/05/15 1749    Lab Results:  Results for orders placed or performed during the hospital encounter of 07/03/15 (from the past 48 hour(s))  Glucose, capillary     Status: Abnormal   Collection Time: 07/04/15  4:52 PM  Result Value Ref Range   Glucose-Capillary 130 (H) 65 - 99 mg/dL   Comment 1 Notify RN   Glucose, capillary     Status: Abnormal   Collection Time: 07/04/15  9:09 PM  Result Value Ref Range   Glucose-Capillary 113 (H) 65 - 99 mg/dL   Comment 1 Notify RN   Glucose, capillary     Status: Abnormal   Collection Time: 07/05/15  6:56 AM  Result Value Ref Range   Glucose-Capillary 141 (H) 65 - 99 mg/dL  Glucose, capillary     Status: Abnormal   Collection Time: 07/05/15  4:28 PM  Result Value Ref Range   Glucose-Capillary 161 (H) 65 - 99 mg/dL   Comment 1 Document in Chart   Glucose, capillary     Status: Abnormal   Collection Time: 07/05/15  9:26 PM  Result Value Ref Range   Glucose-Capillary 124 (H) 65 - 99 mg/dL   Comment 1 Notify RN   Glucose, capillary     Status: Abnormal   Collection Time: 07/06/15  7:20 AM  Result Value Ref Range   Glucose-Capillary 162 (H) 65 - 99 mg/dL  Glucose, capillary     Status: Abnormal   Collection Time: 07/06/15 11:53 AM  Result Value Ref Range   Glucose-Capillary 174 (H) 65 - 99 mg/dL    Physical Findings: AIMS: Facial and Oral Movements Muscles of Facial Expression: None, normal Lips and Perioral Area: None, normal Jaw: None, normal Tongue: None, normal,Extremity Movements Upper (arms, wrists, hands, fingers): None, normal Lower (legs, knees, ankles, toes): None, normal, Trunk Movements Neck, shoulders, hips: None, normal, Overall Severity Severity of abnormal movements (highest score from questions above): None,  normal Incapacitation due to abnormal movements: None, normal Patient's awareness of abnormal movements (rate only patient's report): No Awareness, Dental Status Current problems with teeth and/or dentures?: Yes Does patient usually wear dentures?: Yes  CIWA:  CIWA-Ar Total: 0 COWS:  COWS Total Score: 2  Musculoskeletal: Strength & Muscle Tone: within normal limits Gait & Station: normal Patient leans: N/A  Psychiatric Specialty Exam: Review of Systems  All other systems reviewed and are negative.  Blood pressure 130/85, pulse 76, temperature 98.5 F (36.9 C), temperature source Oral, resp. rate 18, height _0  (1.727 m), weight 72.576 kg (160 lb), SpO2 99 %.Body mass index is 24.33 kg/(m^2).  General Appearance: Casual  Eye Contact::  Good  Speech:  Pressured  Volume:  Increased  Mood:  Euphoric  Affect:  Labile  Thought Process:  Disorganized  Orientation:  Full (Time, Place, and Person)  Thought Content:  WDL  Suicidal Thoughts:  No  Homicidal Thoughts:  No  Memory:  Immediate;   Fair Recent;   Fair Remote;   Fair  Judgement:  Poor  Insight:  Shallow  Psychomotor Activity:  Increased  Concentration:  Fair  Recall:  [employer] of Knowledge:Fair  Language: Fair  Akathisia:  No  Handed:  Right  AIMS (if indicated):     Assets:  Communication Skills Desire for Improvement Financial Resources/Insurance Housing Physical Health Resilience Social Support  ADL's:  Intact  Cognition: WNL  Sleep:  Number of Hours: 7.25   Treatment Plan Summary: Daily contact with patient to assess and evaluate symptoms and progress in treatment and Medication management   Mr. Helfman is a 54 year old male with a history of schizoaffective disorder admitted for psychotic manic episode in the context of medication noncompliance.  1. Agitated behavior. Tthis has resolved.  2. Mood and psychosis. He was restarted on Depakote for mood stabilization and Invega sustenna for psychosis.  His last injection of 156 mg was given on 10/26. We will increase oral Invega to 9 mg daily. VPA level in am.  3. Diabetes. We will continue glipizide, metformin, ADA diet and blood glucose monitoring. HgbA1C 6.5.  4. Hypertension. We will continue lisinopril. Lipid panel not elevated.  5. Dyslipidemia. We'll continue Zocor.  6. Hypothyroidism. We'll continue Synthroid.  7. Anxiety. We'll continue low-dose clonazepam.   8. Smoking. Nicotine patch is available.   9. Disposition. He will be discharged back to his apartment. He will follow up with [name].   Orson Slick 07/06/2015, 12:15 PM

## 2015-07-06 NOTE — [facility] Group Notes (Signed)
[facility] Group Notes:  (Nursing/MHT/Case Management/Adjunct)  Date:  07/06/2015  Time:  12:23 AM  Type of Therapy:  Psychoeducational Skills  Participation Level:  Active  Participation Quality:  Monopolizing and Sharing  Affect:  Not Congruent  Cognitive:  Disorganized  Insight:  Lacking and Limited  Engagement in Group:  Engaged, Monopolizing and Off Topic  Modes of Intervention:  Discussion, Exploration and Problem-solving  Summary of Progress/Problems:  Justin Weeks 07/06/2015, 12:23 AM

## 2015-07-06 NOTE — Progress Notes (Signed)
Recreation Therapy Notes  Date: 11.04.16 Time: 3:00 pm Location: Craft Room  Group Topic: Coping Skills  Goal Area(s) Addresses:  Patient will participate in coping skill. Patient will verbalize benefit of using art as a coping skill.  Behavioral Response: Attentive, Disruptive  Intervention: Coloring  Activity: Patients were given coloring sheets and instructed to color and focus on what they were feeling while they were coloring.  Education: LRT educated patients on healthy coping skills.  Education Outcome: In group clarification offered  Clinical Observations/Feedback: Patient colored coloring sheet. Patient did not contribute to group discussion. Patient asked if peer was going to get written up for leaving early. LRT redirected patient. Patient complied.  Jacquelynn CreeGreene,Essie Gehret M, LRT/CTRS 07/06/2015 3:59 PM

## 2015-07-06 NOTE — [facility] Group Notes (Signed)
[facility] Group Notes:  (Nursing/MHT/Case Management/Adjunct)  Date:  07/06/2015  Time:  11:51 AM  Type of Therapy:  Group Therapy  Participation Level:  Active  Participation Quality:  Appropriate and Sharing  Affect:  Appropriate  Cognitive:  Alert, Appropriate and Oriented  Insight:  Appropriate  Engagement in Group:  Engaged  Modes of Intervention:  Activity  Summary of Progress/Problems:  Justin Weeks 07/06/2015, 11:51 AM

## 2015-07-06 NOTE — Plan of Care (Signed)
Problem: Alteration in thought process Goal: LTG-Patient is able to perceive the environment accurately Outcome: Not Progressing Patient distracted by noises on the unit, leaving the med room multiple times to check on what he heard.  Goal: LTG-Patient verbalizes understanding importance med regimen (Patient verbalizes understanding of importance of medication regimen and need to continue outpatient care.)  Outcome: Progressing Med compliant.   Problem: Ineffective individual coping Goal: STG: Patient will remain free from self harm Outcome: Progressing No self harm.

## 2015-07-06 NOTE — Progress Notes (Signed)
He is pleasant & cooperative.Denies depression,suicidal ideation & hallucination.Participated in groups.Appropriate with staff & peers.Compliant with meds.Appetite good.

## 2015-07-07 LAB — GLUCOSE, CAPILLARY
GLUCOSE-CAPILLARY: 151 mg/dL — AB (ref 65–99)
GLUCOSE-CAPILLARY: 195 mg/dL — AB (ref 65–99)

## 2015-07-07 LAB — VALPROIC ACID LEVEL: VALPROIC ACID LVL: 70 ug/mL (ref 50.0–100.0)

## 2015-07-07 NOTE — [facility] Group Notes (Signed)
[facility] Group Notes:  (Nursing/MHT/Case Management/Adjunct)  Date:  07/07/2015  Time:  9:54 PM  Type of Therapy:  Evening Wrap-up Group  Participation Level:  Active  Participation Quality:  Appropriate  Affect:  Appropriate  Cognitive:  Appropriate  Insight:  Good  Engagement in Group:  Engaged  Modes of Intervention:  Discussion  Summary of Progress/Problems:  Tomasita MorrowChelsea Nanta Jhonny Calixto 07/07/2015, 9:54 PM

## 2015-07-07 NOTE — [facility] Group Notes (Signed)
[facility]   07/07/2015 11:06 AM (Late entry for 07/06/2015)   Participation Quality:  Active  Mood/Affect:  Appropriate  Depression Rating:  0  Anxiety Rating:  0  Thoughts of Suicide:  No Will you contract for safety?   NA  Current AVH:  No  Plan for Discharge/Comments:  Pt plans to return home and follow up with outpatient.  He states his goal for the day is to "behave so I can discharge." He lives in a supportive community through [employer]. His ACT team visited him and are planning to change his Depakote to a liquid.   Transportation Means: Teacher, [employer]   Supports: Teacher, [employer], family, neighbors   Sempra EnergyCandace L Kyreese Chio MSW, [employer] IncLCSWA

## 2015-07-07 NOTE — Progress Notes (Addendum)
[facility]  07/07/2015 3:23 PM Justin Weeks  MRN:  [id number]  Subjective:  Justin Weeks is a 54 year old male with history of schizoaffective disorder bipolar type admitted in a manic episode in the context of treatment noncompliance. He was restarted on his medications and has been improving gradually.  Justin Weeks seen for follow-up. He was  Calm and was asking about his discharge planning. He also asking me to write down my name so he can all in case his discharge planning does not through. he stated that he does  [employer]. Staff reported that he continues to be compliant with his medication and does not have any other adverse effects of the medications. No behavioral problems noted. There are no somatic complaints.  Principal Problem: Schizoaffective disorder, bipolar type ([facility]) Diagnosis:   Patient Active Problem List   Diagnosis Date Noted  . Schizoaffective disorder, bipolar type ([facility]) [F25.0] 07/04/2015  . HTN (hypertension) [I10] 07/04/2015  . Dyslipidemia [E78.5] 07/04/2015  . Tobacco use disorder [F17.200] 07/04/2015  . Noncompliance [Z91.19] 07/03/2015  . Hypothyroidism [E03.9] 07/03/2015  . Diabetes ([facility]) [E11.9] 04/20/2015   Total Time spent with patient: 20 minutes  Past Psychiatric History:  Schizoaffective disorder bipolar type.  Past Medical History:  Past Medical History  Diagnosis Date  . Arthritis   . Asthma   . Blood transfusion without reported diagnosis   . Diabetes mellitus without complication ([facility])   . Hypertension   . Seizures ([facility])   . Personal history of tobacco use, presenting hazards to health 05/31/2015  . Hypothyroid 07/03/2015   History reviewed. No pertinent past surgical history. Family History: History reviewed. No pertinent family history. Family Psychiatric  History:  None reported. Social History:  History  Alcohol Use No     History  Drug Use Not on file    Social History   Social History  . Marital  Status: Divorced    Spouse Name: N/A  . Number of Children: N/A  . Years of Education: N/A   Social History Main Topics  . Smoking status: Current Every Day Smoker -- 1.00 packs/day for 40 years  . Smokeless tobacco: None  . Alcohol Use: No  . Drug Use: None  . Sexual Activity: Yes   Other Topics Concern  . None   Social History Narrative   Additional Social History:                         Sleep: Good  Appetite:  Good  Current Medications: Current Facility-Administered Medications  Medication Dose Route Frequency Provider Last Rate Last Dose  . acetaminophen (TYLENOL) tablet 650 mg  650 mg Oral Q6H PRN Audery AmelJohn T Clapacs, MD      . alum & mag hydroxide-simeth (MAALOX/MYLANTA) 200-200-20 MG/5ML suspension 30 mL  30 mL Oral Q4H PRN Audery AmelJohn T Clapacs, MD      . clonazePAM Scarlette Calico(KLONOPIN) tablet 0.5 mg  0.5 mg Oral BID Audery AmelJohn T Clapacs, MD   0.5 mg at 07/07/15 0924  . glipiZIDE (GLUCOTROL XL) 24 hr tablet 10 mg  10 mg Oral Q breakfast Audery AmelJohn T Clapacs, MD   10 mg at 07/07/15 0924  . levothyroxine (SYNTHROID, LEVOTHROID) tablet 50 mcg  50 mcg Oral QAC breakfast Audery AmelJohn T Clapacs, MD   50 mcg at 07/07/15 [id number]  . linagliptin (TRADJENTA) tablet 5 mg  5 mg Oral Daily Audery AmelJohn T Clapacs, MD   5 mg at 07/07/15 0924  .  lisinopril (PRINIVIL,ZESTRIL) tablet 5 mg  5 mg Oral Daily Audery Amel, MD   5 mg at 07/07/15 0925  . magnesium hydroxide (MILK OF MAGNESIA) suspension 30 mL  30 mL Oral Daily PRN Audery Amel, MD      . metFORMIN (GLUCOPHAGE) tablet 1,000 mg  1,000 mg Oral BID WC Audery Amel, MD   1,000 mg at 07/07/15 0924  . nicotine (NICODERM CQ - dosed in mg/24 hours) patch 21 mg  21 mg Transdermal Daily Shari Prows, MD   21 mg at 07/07/15 0936  . [START ON 07/25/2015] paliperidone (INVEGA SUSTENNA) injection 234 mg  234 mg Intramuscular Once Jolanta B Pucilowska, MD      . paliperidone (INVEGA) 24 hr tablet 9 mg  9 mg Oral Daily Jolanta B Pucilowska, MD   9 mg at 07/07/15 0925  .  senna-docusate (Senokot-S) tablet 1 tablet  1 tablet Oral BH-q7a Shari Prows, MD   1 tablet at 07/07/15 [phone]  . simvastatin (ZOCOR) tablet 20 mg  20 mg Oral q1800 Audery Amel, MD   20 mg at 07/06/15 1708  . Valproic Acid (DEPAKENE) 250 MG/5ML syrup SYRP 500 mg  500 mg Oral TID AC Shari Prows, MD   500 mg at 07/07/15 1238    Lab Results:  Results for orders placed or performed during the hospital encounter of 07/03/15 (from the past 48 hour(s))  Glucose, capillary     Status: Abnormal   Collection Time: 07/05/15  4:28 PM  Result Value Ref Range   Glucose-Capillary 161 (H) 65 - 99 mg/dL   Comment 1 Document in Chart   Glucose, capillary     Status: Abnormal   Collection Time: 07/05/15  9:26 PM  Result Value Ref Range   Glucose-Capillary 124 (H) 65 - 99 mg/dL   Comment 1 Notify RN   Glucose, capillary     Status: Abnormal   Collection Time: 07/06/15  7:20 AM  Result Value Ref Range   Glucose-Capillary 162 (H) 65 - 99 mg/dL  Glucose, capillary     Status: Abnormal   Collection Time: 07/06/15 11:53 AM  Result Value Ref Range   Glucose-Capillary 174 (H) 65 - 99 mg/dL  Valproic acid level     Status: None   Collection Time: 07/06/15  2:52 PM  Result Value Ref Range   Valproic Acid Lvl 85 50.0 - 100.0 ug/mL  Glucose, capillary     Status: Abnormal   Collection Time: 07/06/15  8:32 PM  Result Value Ref Range   Glucose-Capillary 313 (H) 65 - 99 mg/dL  Glucose, capillary     Status: Abnormal   Collection Time: 07/07/15  6:41 AM  Result Value Ref Range   Glucose-Capillary 151 (H) 65 - 99 mg/dL   Comment 1 Notify RN   Valproic acid level     Status: None   Collection Time: 07/07/15  6:55 AM  Result Value Ref Range   Valproic Acid Lvl 70 50.0 - 100.0 ug/mL    Physical Findings: AIMS: Facial and Oral Movements Muscles of Facial Expression: None, normal Lips and Perioral Area: None, normal Jaw: None, normal Tongue: None, normal,Extremity Movements Upper (arms,  wrists, hands, fingers): None, normal Lower (legs, knees, ankles, toes): None, normal, Trunk Movements Neck, shoulders, hips: None, normal, Overall Severity Severity of abnormal movements (highest score from questions above): None, normal Incapacitation due to abnormal movements: None, normal Patient's awareness of abnormal movements (rate only patient's report): No Awareness,  Dental Status Current problems with teeth and/or dentures?: Yes Does patient usually wear dentures?: Yes  CIWA:  CIWA-Ar Total: 0 COWS:  COWS Total Score: 2  Musculoskeletal: Strength & Muscle Tone: within normal limits Gait & Station: normal Patient leans: N/A  Psychiatric Specialty Exam: Review of Systems  All other systems reviewed and are negative.   Blood pressure 124/91, pulse 107, temperature 98 F (36.7 C), temperature source Oral, resp. rate 20, height  (1.727 m), weight 160 lb (72.576 kg), SpO2 99 %.Body mass index is 24.33 kg/(m^2).  General Appearance: Casual  Eye Contact::  Good  Speech:  Pressured  Volume:  Increased  Mood:  Anxious and Irritable  Affect:  Labile  Thought Process:  Disorganized  Orientation:  Full (Time, Place, and Person)  Thought Content:  WDL  Suicidal Thoughts:  No  Homicidal Thoughts:  No  Memory:  Immediate;   Fair Recent;   Fair Remote;   Fair  Judgement:  Poor  Insight:  Shallow  Psychomotor Activity:  Increased  Concentration:  Fair  Recall:  [employer] of Knowledge:Fair  Language: Fair  Akathisia:  No  Handed:  Right  AIMS (if indicated):     Assets:  Communication Skills Desire for Improvement Financial Resources/Insurance Housing Physical Health Resilience Social Support  ADL's:  Intact  Cognition: WNL  Sleep:  Number of Hours: 7.75   Treatment Plan Summary: Daily contact with patient to assess and evaluate symptoms and progress in treatment and Medication management   Justin Weeks is a 54 year old male with a history of schizoaffective  disorder admitted for psychotic manic episode in the context of medication noncompliance.  1. Agitated behavior. Occasionally shows disorganized behavior  2. Mood and psychosis. He was restarted on Depakote for mood stabilization and Invega sustenna for psychosis. His last injection of 156 mg was given on 10/26. We will increase oral Invega to 9 mg daily. VPA level in am.  3. Diabetes. We will continue glipizide, metformin, ADA diet and blood glucose monitoring. HgbA1C 6.5.  4. Hypertension. We will continue lisinopril. Lipid panel not elevated.  5. Dyslipidemia. We'll continue Zocor.  6. Hypothyroidism. We'll continue Synthroid.  7. Anxiety. We'll continue low-dose clonazepam.   8. Smoking. Nicotine patch is available.   9. Disposition. He will be discharged back to his apartment. He will follow up with [facility] ACT team.   Brandy Hale 07/07/2015, 3:23 PM

## 2015-07-07 NOTE — [facility] Group Notes (Signed)
[facility] LCSW Group Therapy  07/07/2015 11:00 AM (Late entry for 07/06/2015)   Type of Therapy:  Group Therapy  Participation Level:  Active  Participation Quality:  Attentive  Affect:  Excited  Cognitive:  Alert  Insight:  Improving  Engagement in Therapy:  Improving  Modes of Intervention:  Discussion, Education, Socialization and Support  Summary of Progress/Problems: Feelings around Relapse. Group members discussed the meaning of relapse and shared personal stories of relapse, how it affected them and others, and how they perceived themselves during this time. Group members were encouraged to identify triggers, warning signs and coping skills used when facing the possibility of relapse. Social supports were discussed and explored in detail. Yuriy attended group and stayed the entire time. He was less intrusive during this group. He discussed needing healthy relationships and self care as ways to maintain recovery. He states he relapsed due to not taking his medication properly.   Rondall Allegraandace L Debany Vantol ,MSW, LCSWA  07/07/2015, 11:00 AM

## 2015-07-07 NOTE — [facility] Group Notes (Signed)
[facility] LCSW Group Therapy  07/07/2015 3:05 PM  Type of Therapy:  Group Therapy  Participation Level:  Active  Participation Quality:  Intrusive  Affect:  Labile  Cognitive:  Disorganized   Insight:  Limited  Engagement in Therapy:  Limited  Modes of Intervention:  Discussion, Education, Socialization and Support  Summary of Progress/Problems: Todays topic: Grudges  Patients will be encouraged to discuss their thoughts, feelings, and behaviors as to why one holds on to grudges and reasons why people have grudges. Patients will process the impact of grudges on their daily lives and identify thoughts and feelings related to holding grudges. Patients will identify feelings and thoughts related to what life would look like without grudges. Justin Weeks attended group and stayed the entire time. He was intrusive, loud and laughed randomly.   Justin Weeks L Srah Ake MSW, LCSWA  07/07/2015, 3:05 PM

## 2015-07-07 NOTE — Plan of Care (Signed)
Problem: Ineffective individual coping Goal: STG: Pt will be able to identify effective and ineffective STG: Pt will be able to identify effective and ineffective coping patterns  Outcome: Progressing Pt progressing well with appropriate coping skills.   Problem: Alteration in thought process Goal: LTG-Patient behavior demonstrates decreased signs psychosis (Patient behavior demonstrates decreased signs of psychosis to the point the patient is safe to return home and continue treatment in an outpatient setting.)  Outcome: Progressing Patient is alert and oriented x 4. Not seen responding to AVH.  Goal: LTG-Patient is able to perceive the environment accurately Outcome: Progressing Perceives the environment accurately.  Goal: LTG-Patient verbalizes understanding importance med regimen (Patient verbalizes understanding of importance of medication regimen and need to continue outpatient care.)  Outcome: Progressing Pt med compliant, concerned about the potential of the Valproic Acid raising his blood sugar levels. Wants to discuss further with doctors.

## 2015-07-07 NOTE — Progress Notes (Signed)
Pt has been active on the unit. Pt denies SI and A/V hallucinations. Pt's mood and affect has been depressed. Pt is able to contract for safety. No inappropriate behaviors noted this period.

## 2015-07-08 LAB — GLUCOSE, CAPILLARY
Glucose-Capillary: 301 mg/dL — ABNORMAL HIGH (ref 65–99)
Glucose-Capillary: 176 mg/dL — ABNORMAL HIGH (ref 65–99)
Glucose-Capillary: 223 mg/dL — ABNORMAL HIGH (ref 65–99)
Glucose-Capillary: 172 mg/dL — ABNORMAL HIGH (ref 65–99)

## 2015-07-08 NOTE — Plan of Care (Signed)
Problem: Ineffective individual coping Goal: STG: Pt will be able to identify effective and ineffective STG: Pt will be able to identify effective and ineffective coping patterns  Outcome: Progressing Pt is doing well, med compliant, utilizing appropriate coping techniques.  Goal: STG: Patient will participate in after care plan Outcome: Progressing Patient notes plant to be active in his his treatment plan upon discharge. States he will be med compliant and work with ACT team.   Problem: Ineffective individual coping Goal: STG: Patient will remain free from self harm Outcome: Progressing No self harm.

## 2015-07-08 NOTE — Plan of Care (Signed)
Problem: Ineffective individual coping Goal: STG: Pt will be able to identify effective and ineffective STG: Pt will be able to identify effective and ineffective coping patterns  Outcome: Progressing Pt med compliant, pleasantly interactive, and present in the milieu. Willing to participate in the assessment process.  Goal: STG: Patient will participate in after care plan Outcome: Progressing Patient notes he will be med compliant and attend follow up appointments after discharge.   Problem: Ineffective individual coping Goal: STG: Patient will remain free from self harm Outcome: Progressing No self harm.

## 2015-07-08 NOTE — Progress Notes (Signed)
Pt has been pleasant and cooperative. Pt continues to be active on the unit. Pt's mood and affect has been depressed.Pt. Denies SI and A/V hallucinations.Pt c/o having some anxiety. Pt has attended some unit activities . Some manic behaviors noted.

## 2015-07-08 NOTE — [facility] Group Notes (Signed)
[facility] LCSW Group Therapy  07/08/2015 2:09 PM  Type of Therapy:  Group Therapy  Participation Level:  Active  Participation Quality:  Intrusive  Affect:  Labile  Cognitive:  Disorganized  Insight:  Limited  Engagement in Therapy:  Limited  Modes of Intervention:  Discussion, Education, Socialization and Support  Summary of Progress/Problems: Balance in life: Patients will discuss the concept of balance and how it looks and feels to be unbalanced. Pt will identify areas in their life that is unbalanced and ways to become more balanced.  Justin Weeks was intrusive and disruptive during group. He had difficulties staying on topic today. He states he now wants to stay at the hospital for 2 months so he can reach his goals. He states his goals are to find independent housing in [place].   Justin Weeks L Jadan Hinojos MSW, LCSWA  07/08/2015, 2:09 PM

## 2015-07-08 NOTE — Progress Notes (Signed)
[facility] MD Progress Note  07/08/2015 3:51 PM Justin Weeks  MRN:  [id number]  Subjective:  Justin Weeks is a 54 year old male with history of schizoaffective disorder bipolar type admitted in a manic episode in the context of treatment noncompliance.  Justin Weeks seen for follow-up. He was  focused on getting my name and asking me that he wants to have it so he can contact me after his discharge. He reported that he will be discharged soon and he wants to keep the names of all the physicians. Staff reported that he has to be redirected several times. He has been compliant with his medications. Remains intrusive    Principal Problem: Schizoaffective disorder, bipolar type (HCC) Diagnosis:   Patient Active Problem List   Diagnosis Date Noted  . Schizoaffective disorder, bipolar type (HCC) [F25.0] 07/04/2015  . HTN (hypertension) [I10] 07/04/2015  . Dyslipidemia [E78.5] 07/04/2015  . Tobacco use disorder [F17.200] 07/04/2015  . Noncompliance [Z91.19] 07/03/2015  . Hypothyroidism [E03.9] 07/03/2015  . Diabetes (HCC) [E11.9] 04/20/2015   Total Time spent with patient: 20 minutes  Past Psychiatric History:  Schizoaffective disorder bipolar type.  Past Medical History:  Past Medical History  Diagnosis Date  . Arthritis   . Asthma   . Blood transfusion without reported diagnosis   . Diabetes mellitus without complication (HCC)   . Hypertension   . Seizures (HCC)   . Personal history of tobacco use, presenting hazards to health 05/31/2015  . Hypothyroid 07/03/2015   History reviewed. No pertinent past surgical history. Family History: History reviewed. No pertinent family history. Family Psychiatric  History:  None reported. Social History:  History  Alcohol Use No     History  Drug Use Not on file    Social History   Social History  . Marital Status: Divorced    Spouse Name: N/A  . Number of Children: N/A  . Years of Education: N/A   Social History Main Topics  . Smoking  status: Current Every Day Smoker -- 1.00 packs/day for 40 years  . Smokeless tobacco: None  . Alcohol Use: No  . Drug Use: None  . Sexual Activity: Yes   Other Topics Concern  . None   Social History Narrative   Additional Social History:                         Sleep: Good  Appetite:  Good  Current Medications: Current Facility-Administered Medications  Medication Dose Route Frequency Provider Last Rate Last Dose  . acetaminophen (TYLENOL) tablet 650 mg  650 mg Oral Q6H PRN Audery Amel, MD      . alum & mag hydroxide-simeth (MAALOX/MYLANTA) 200-200-20 MG/5ML suspension 30 mL  30 mL Oral Q4H PRN Audery Amel, MD      . clonazePAM Scarlette Calico) tablet 0.5 mg  0.5 mg Oral BID Audery Amel, MD   0.5 mg at 07/08/15 0834  . glipiZIDE (GLUCOTROL XL) 24 hr tablet 10 mg  10 mg Oral Q breakfast Audery Amel, MD   10 mg at 07/08/15 0834  . levothyroxine (SYNTHROID, LEVOTHROID) tablet 50 mcg  50 mcg Oral QAC breakfast Audery Amel, MD   50 mcg at 07/08/15 [phone]  . linagliptin (TRADJENTA) tablet 5 mg  5 mg Oral Daily Audery Amel, MD   5 mg at 07/08/15 9629  . lisinopril (PRINIVIL,ZESTRIL) tablet 5 mg  5 mg Oral Daily Audery Amel, MD   5 mg at  07/08/15 0834  . magnesium hydroxide (MILK OF MAGNESIA) suspension 30 mL  30 mL Oral Daily PRN Audery AmelJohn T Clapacs, MD      . metFORMIN (GLUCOPHAGE) tablet 1,000 mg  1,000 mg Oral BID WC Audery AmelJohn T Clapacs, MD   1,000 mg at 07/08/15 0833  . nicotine (NICODERM CQ - dosed in mg/24 hours) patch 21 mg  21 mg Transdermal Daily Jolanta B Pucilowska, MD   21 mg at 07/08/15 0835  . [START ON 07/25/2015] paliperidone (INVEGA SUSTENNA) injection 234 mg  234 mg Intramuscular Once Jolanta B Pucilowska, MD      . paliperidone (INVEGA) 24 hr tablet 9 mg  9 mg Oral Daily Jolanta B Pucilowska, MD   9 mg at 07/08/15 0833  . senna-docusate (Senokot-S) tablet 1 tablet  1 tablet Oral BH-q7a Shari ProwsJolanta B Pucilowska, MD   1 tablet at 07/08/15 [phone]  . simvastatin  (ZOCOR) tablet 20 mg  20 mg Oral q1800 Audery AmelJohn T Clapacs, MD   20 mg at 07/07/15 1753  . Valproic Acid (DEPAKENE) 250 MG/5ML syrup SYRP 500 mg  500 mg Oral TID AC Shari ProwsJolanta B Pucilowska, MD   500 mg at 07/08/15 1349    Lab Results:  Results for orders placed or performed during the hospital encounter of 07/03/15 (from the past 48 hour(s))  Glucose, capillary     Status: Abnormal   Collection Time: 07/06/15  8:32 PM  Result Value Ref Range   Glucose-Capillary 313 (H) 65 - 99 mg/dL  Glucose, capillary     Status: Abnormal   Collection Time: 07/07/15  6:41 AM  Result Value Ref Range   Glucose-Capillary 151 (H) 65 - 99 mg/dL   Comment 1 Notify RN   Valproic acid level     Status: None   Collection Time: 07/07/15  6:55 AM  Result Value Ref Range   Valproic Acid Lvl 70 50.0 - 100.0 ug/mL  Glucose, capillary     Status: Abnormal   Collection Time: 07/07/15  4:40 PM  Result Value Ref Range   Glucose-Capillary 195 (H) 65 - 99 mg/dL  Glucose, capillary     Status: Abnormal   Collection Time: 07/08/15 10:37 AM  Result Value Ref Range   Glucose-Capillary 301 (H) 65 - 99 mg/dL  Glucose, capillary     Status: Abnormal   Collection Time: 07/08/15 11:59 AM  Result Value Ref Range   Glucose-Capillary 176 (H) 65 - 99 mg/dL    Physical Findings: AIMS: Facial and Oral Movements Muscles of Facial Expression: None, normal Lips and Perioral Area: None, normal Jaw: None, normal Tongue: None, normal,Extremity Movements Upper (arms, wrists, hands, fingers): None, normal Lower (legs, knees, ankles, toes): None, normal, Trunk Movements Neck, shoulders, hips: None, normal, Overall Severity Severity of abnormal movements (highest score from questions above): None, normal Incapacitation due to abnormal movements: None, normal Patient's awareness of abnormal movements (rate only patient's report): No Awareness, Dental Status Current problems with teeth and/or dentures?: Yes Does patient usually wear  dentures?: Yes  CIWA:  CIWA-Ar Total: 0 COWS:  COWS Total Score: 2  Musculoskeletal: Strength & Muscle Tone: within normal limits Gait & Station: normal Patient leans: N/A  Psychiatric Specialty Exam: Review of Systems  All other systems reviewed and are negative.   Blood pressure 111/74, pulse 105, temperature 97.8 F (36.6 C), temperature source Oral, resp. rate 20, height 5\' 8"  (1.727 m), weight 160 lb (72.576 kg), SpO2 99 %.Body mass index is 24.33 kg/(m^2).  General Appearance: Casual  Eye Contact::  Good  Speech:  Pressured  Volume:  Increased  Mood:  Anxious and Irritable  Affect:  Labile  Thought Process:  Disorganized  Orientation:  Full (Time, Place, and Person)  Thought Content:  WDL  Suicidal Thoughts:  No  Homicidal Thoughts:  No  Memory:  Immediate;   Fair Recent;   Fair Remote;   Fair  Judgement:  Poor  Insight:  Shallow  Psychomotor Activity:  Increased  Concentration:  Fair  Recall:  [employer] of Knowledge:Fair  Language: Fair  Akathisia:  No  Handed:  Right  AIMS (if indicated):     Assets:  Communication Skills Desire for Improvement Financial Resources/Insurance Housing Physical Health Resilience Social Support  ADL's:  Intact  Cognition: WNL  Sleep:  Number of Hours: 8   Treatment Plan Summary: Daily contact with patient to assess and evaluate symptoms and progress in treatment and Medication management   Justin Weeks is a 54 year old male with a history of schizoaffective disorder admitted for psychotic manic episode in the context of medication noncompliance.  1. Agitated behavior. Occasionally shows disorganized behavior  2. Mood and psychosis. He was restarted on Depakote for mood stabilization and Invega sustenna for psychosis. His last injection of 156 mg was given on 10/26. We will increase oral Invega to 9 mg daily. VPA level in am.  3. Diabetes. We will continue glipizide, metformin, ADA diet and blood glucose monitoring. HgbA1C  6.5.  4. Hypertension. We will continue lisinopril. Lipid panel not elevated.  5. Dyslipidemia. We'll continue Zocor.  6. Hypothyroidism. We'll continue Synthroid.  7. Anxiety. We'll continue low-dose clonazepam.   8. Smoking. Nicotine patch is available.   9. Disposition. He will be discharged back to his apartment. He will follow up with [facility] ACT team.   Brandy Hale 07/08/2015, 3:51 PM

## 2015-07-09 LAB — GLUCOSE, CAPILLARY: GLUCOSE-CAPILLARY: 154 mg/dL — AB (ref 65–99)

## 2015-07-09 MED ORDER — LISINOPRIL 5 MG PO TABS: 5.0000 mg | ORAL_TABLET | Freq: Every day | ORAL | Status: DC

## 2015-07-09 MED ORDER — DIVALPROEX SODIUM 500 MG PO DR TAB: 500.0000 mg | DELAYED_RELEASE_TABLET | Freq: Three times a day (TID) | ORAL | Status: DC

## 2015-07-09 MED ORDER — PALIPERIDONE PALMITATE 234 MG/1.5ML IM SUSP: 234.0000 mg | Freq: Once | INTRAMUSCULAR | Status: DC

## 2015-07-09 NOTE — Progress Notes (Signed)
  [facility] :  Will you be returning to the same living situation after discharge:  Yes,  home to his apartment At discharge, do you have transportation home?: Yes,  ACT team will pick up Do you have the ability to pay for your medications: Yes,  patient has Medicare  Release of information consent forms completed and in the chart;  Patient's signature needed at discharge.  Patient to Follow up at: Follow-up Information    Follow up with [facility] On 07/09/2015.   Why:  For follow-up care appt ACT team will pick patient up at discharge today 07/09/15 at 1:15pm   Contact information:   [address] [place], [place] Ph [phone] Fax [phone]       Next level of care provider [facility] Link:no  Patient denies SI/HI: Yes,  patient denies SI/HI    Safety Planning and Suicide Prevention discussed: Yes,  SPE discussed with patient and his ACT team but patient refused any further family contact.   Have you used any form of tobacco in the last 30 days? (Cigarettes, Smokeless Tobacco, Cigars, and/or Pipes): Yes  Has patient been referred to the Quitline?: Patient refused referral  Lulu Ridingngle, Jermaine Neuharth T, MSW, LCSWA 07/09/2015, 10:42 AM

## 2015-07-09 NOTE — Progress Notes (Signed)
Patient denies SI/HI, denies A/V hallucinations. Patient verbalizes understanding of discharge instructions, follow up care and prescriptions. Patient given all belongings from  [employer].Discharge instructions explained to ACT team staff also. Patient escorted out by staff, transported by ACT team staff.

## 2015-07-09 NOTE — Progress Notes (Signed)
Patient is pleasant, cooperative, and med compliant. He is interactive with his peers. He notes he is ready to go home, as he feels much better. He notes he will continue to work with his ACT team, watch his diet and diabetes, and take his medications as prescribed.He says he will participate in his care because he wants to be independent and continue to live his life as well as he can.  He denies SI, HI, and AVH.

## 2015-07-09 NOTE — Progress Notes (Signed)
Inpatient Diabetes Program Recommendations  AACE/ADA: New Consensus Statement on Inpatient Glycemic Control (2015)  Target Ranges:  Prepandial:   less than 140 mg/dL      Peak postprandial:   less than 180 mg/dL (1-2 hours)      Critically ill patients:  140 - 180 mg/dL   Review of Glycemic Control  Results for Justin Weeks, Justin A (MRN [id number]) as of 07/09/2015 09:53  Ref. Range 07/08/2015 10:37 07/08/2015 11:59 07/08/2015 16:15 07/08/2015 20:29 07/09/2015 06:50  Glucose-Capillary Latest Ref Range: 65-99 mg/dL 742301 (H) 595176 (H) 638223 (H) 172 (H) 154 (H)    Diabetes history: Type 2 Outpatient Diabetes medications: Glipizide 10mg  bid, Metformin 1000mg  bid, Januvia 100mg  qam.  Current orders for Inpatient glycemic control: Glipizide 10mg  qday, Metformin 1000mg  bid, Tradgenta 5mg /day  Inpatient Diabetes Program Recommendations: Consider adding Glipizide 5mg  in the evening-  Medication lists Glipizide 10mg  bid at home and A1C was ideal- may want to confirm what he was taking at home since he was neglecting meds when he was admitted.   Susette RacerJulie Aspyn Warnke, RN, BA, MHA, CDE Diabetes Coordinator Inpatient Diabetes Program  [phone] (Team Pager) [phone] [facility] Office) 07/09/2015 10:00 AM

## 2015-07-09 NOTE — [facility] Suicide Risk Assessment (Signed)
[facility] INPATIENT:  Family/Significant Other Suicide Prevention Education  Suicide Prevention Education:  Patient Refusal for Family/Significant Other Suicide Prevention Education: The patient Justin Weeks has refused to provide written consent for family/significant other to be provided Family/Significant Other Suicide Prevention Education during admission and/or prior to discharge.  Physician notified.  Lulu RidingIngle, Copper Basnett T, MSW, LCSWA 07/09/2015, 10:41 AM

## 2015-07-09 NOTE — [facility] Suicide Risk Assessment (Signed)
[facility]   Demographic Factors:  Male and Living alone  Total Time spent with patient: 30 minutes  Musculoskeletal: Strength & Muscle Tone: within normal limits Gait & Station: normal Patient leans: N/A  Psychiatric Specialty Exam: Physical Exam  Nursing note and vitals reviewed.   Review of Systems  All other systems reviewed and are negative.   Blood pressure 113/82, pulse 112, temperature 97.7 F (36.5 C), temperature source Oral, resp. rate 20, height 5\' 8"  (1.727 m), weight 72.576 kg (160 lb), SpO2 99 %.Body mass index is 24.33 kg/(m^2).  General Appearance: Casual  Eye Contact::  Good  Speech:  Clear and Coherent409  Volume:  Normal  Mood:  Euthymic  Affect:  Appropriate  Thought Process:  Goal Directed  Orientation:  Full (Time, Place, and Person)  Thought Content:  WDL  Suicidal Thoughts:  No  Homicidal Thoughts:  No  Memory:  Immediate;   Fair Recent;   Fair Remote;   Fair  Judgement:  Fair  Insight:  Fair  Psychomotor Activity:  Normal  Concentration:  Fair  Recall:  [employer]  Fund of Knowledge:Fair  Language: Fair  Akathisia:  No  Handed:  Right  AIMS (if indicated):     Assets:  Communication Skills Desire for Improvement Financial Resources/Insurance Housing Physical Health Resilience Social Support  Sleep:  Number of Hours: 8.25  Cognition: WNL  ADL's:  Intact   Have you used any form of tobacco in the last 30 days? (Cigarettes, Smokeless Tobacco, Cigars, and/or Pipes): Yes  Has this patient used any form of tobacco in the last 30 days? (Cigarettes, Smokeless Tobacco, Cigars, and/or Pipes) Yes, A prescription for an FDA-approved tobacco cessation medication was offered at discharge and the patient refused  Mental Status Per Nursing Assessment::   On Admission:     Current Mental Status by Physician: NA  Loss Factors: NA  Historical Factors: Impulsivity  Risk Reduction Factors:   Sense of responsibility to  family, Positive social support and Positive therapeutic relationship  Continued Clinical Symptoms:  Bipolar Disorder:   Mixed State  Cognitive Features That Contribute To Risk:  None    Suicide Risk:  Minimal: No identifiable suicidal ideation.  Patients presenting with no risk factors but with morbid ruminations; may be classified as minimal risk based on the severity of the depressive symptoms  Principal Problem: Schizoaffective disorder, bipolar type [facility](HCC) Discharge Diagnoses:  Patient Active Problem List   Diagnosis Date Noted  . Schizoaffective disorder, bipolar type (HCC) [F25.0] 07/04/2015  . HTN (hypertension) [I10] 07/04/2015  . Dyslipidemia [E78.5] 07/04/2015  . Tobacco use disorder [F17.200] 07/04/2015  . Noncompliance [Z91.19] 07/03/2015  . Hypothyroidism [E03.9] 07/03/2015  . Diabetes (HCC) [E11.9] 04/20/2015      Plan Of Care/Follow-up recommendations:  Activity:  As tolerated. Diet:  low sodium heart healthy. Other:  Keep follow-up appointments.  Is patient on multiple antipsychotic therapies at discharge:  Yes,   Do you recommend tapering to monotherapy for antipsychotics?  Yes   Has Patient had three or more failed trials of antipsychotic monotherapy by history:  No  Recommended Plan for Multiple Antipsychotic Therapies: Taper to monotherapy as described:  Discontinue Risperdal and maintain [place]vega Sustenna injection.    Jolanta Pucilowska 07/09/2015, 9:41 AM

## 2015-07-09 NOTE — Discharge Summary (Signed)
Physician Discharge Summary Note  Patient:  Justin Weeks is an 54 y.o., male MRN:  [id number] DOB:  [birth year] Patient phone:  [phone] (home)  Patient address:   [address] [place] [address],  Total Time spent with patient: 30 minutes  Date of Admission:  07/03/2015 Date of Discharge: 07/09/2015  Reason for Admission:  Manic episode.  Identifying data. Mr. Ruacho is a 54 year old male with a history of schizoaffective disorder.  Chief complaint. "I am not taking a horse pill."  History of present illness. Information was obtained from the patient the chart and his ACT team. Mr. Glendon Axe has a long history of schizophrenia but has been able to maintain independent living for years. He is in the care of ACT teem and relatively stable on a combination of Depakote and [place] injections. His treatment team noticed that the patient has been declining over the past several months becoming increasingly psychotic and disorganized. They suspected medication noncompliance. Indeed his Depakote level on admission was very low. The patient has been maintained on vague assess and I injections. Last injection 156 mg was given on October 26. On admission the patient was disorganized, unable to answer simple questions. He was also allowed and agitated and manic. She is very adamant about not taking medication especially the horse pill which I suspect is Depakote. He complains of hallucinations. He denies any symptoms of depression or anxiety. No substances are involved.  PAST PSYCHIATRIC HISTORY: He was diagnosed with schizophrenia in the 29s. He was [facility] three [facility] and [facility] in January 2016. He denies suicide attempts. He denies substance abuse.   FAMILY PSYCHIATRIC HISTORY: None reported.   SOCIAL HISTORY: Went to 11th grade. He now lives independently. 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   Principal Problem: Schizoaffective  disorder, bipolar type [facility]) Discharge Diagnoses: Patient Active Problem List   Diagnosis Date Noted  . Schizoaffective disorder, bipolar type (HCC) [F25.0] 07/04/2015  . HTN (hypertension) [I10] 07/04/2015  . Dyslipidemia [E78.5] 07/04/2015  . Tobacco use disorder [F17.200] 07/04/2015  . Noncompliance [Z91.19] 07/03/2015  . Hypothyroidism [E03.9] 07/03/2015  . Diabetes (HCC) [E11.9] 04/20/2015    Musculoskeletal: Strength & Muscle Tone: within normal limits Gait & Station: normal Patient leans: N/A  Psychiatric Specialty Exam: Physical Exam  Nursing note and vitals reviewed.   Review of Systems  All other systems reviewed and are negative.   Blood pressure 113/82, pulse 112, temperature 97.7 F (36.5 C), temperature source Oral, resp. rate 20, height  (1.727 m), weight 72.576 kg (160 lb), SpO2 99 %.Body mass index is 24.33 kg/(m^2).  See SRA.                                                  Sleep:  Number of Hours: 8.25   Have you used any form of tobacco in the last 30 days? (Cigarettes, Smokeless Tobacco, Cigars, and/or Pipes): Yes  Has this patient used any form of tobacco in the last 30 days? (Cigarettes, Smokeless Tobacco, Cigars, and/or Pipes) Yes, A prescription for an FDA-approved tobacco cessation medication was offered at discharge and the patient refused  Past Medical History:  Past Medical History  Diagnosis Date  . Arthritis   . Asthma   . Blood transfusion without reported diagnosis   .  Diabetes mellitus without complication (HCC)   . Hypertension   . Seizures (HCC)   . Personal history of tobacco use, presenting hazards to health 05/31/2015  . Hypothyroid 07/03/2015   History reviewed. No pertinent past surgical history. Family History: History reviewed. No pertinent family history. Social History:  History  Alcohol Use No     History  Drug Use Not on file    Social History   Social History  . Marital Status: Divorced     Spouse Name: N/A  . Number of Children: N/A  . Years of Education: N/A   Social History Main Topics  . Smoking status: Current Every Day Smoker -- 1.00 packs/day for 40 years  . Smokeless tobacco: None  . Alcohol Use: No  . Drug Use: None  . Sexual Activity: Yes   Other Topics Concern  . None   Social History Narrative    Past Psychiatric History: Hospitalizations:  Outpatient Care:  Substance Abuse Care:  Self-Mutilation:  Suicidal Attempts:  Violent Behaviors:   Risk to Self: Is patient at risk for suicide?: No What has been your use of drugs/alcohol within the last 12 months?: Denies use  Risk to Others:   Prior Inpatient Therapy:   Prior Outpatient Therapy:    Level of Care:  OP  Hospital Course:    Mr. Fredericksen is a 54 year old male with a history of schizoaffective disorder admitted for psychotic manic episode in the context of medication noncompliance.  1. Agitated behavior. Resolved.  2. Mood and psychosis. He was restarted on Depakote for mood stabilization and Invega sustenna for psychosis. His last injection of 156 mg was given on 10/26. He was given oral Invega in the hospital. We recommend to increase [place] injections to 234 mg every month. He will continue taking oral Risperdal at home as Hinda Glatter Is not on his formulary. This could be discontinued eventually. He was given in liquid Depakote in the hospital as Dr. Annia Friendly by his primary psychiatrist would like to switch him to liquid form.  With increased Depakote to 1500 mg daily.VPA level 70. unfortunately our system does not allow me to prescribe liquid Depakote for home. We will leave it up to the act team.  3. Diabetes. We continued glipizide, metformin, ADA diet and blood glucose monitoring. HgbA1C 6.5.  4. Hypertension. We  continued lisinopril.   5. Dyslipidemia. We continued Zocor. Lipid panel is not elevated.  6. Hypothyroidism. Wel continued Synthroid. TSH 1.063.  7. Anxiety. We  continued low-dose clonazepam.   8. Smoking. Nicotine patch was available.   9. Disposition. He was discharged back to his apartment. He will follow up with [facility] ACT team.    Consults:  None  Significant Diagnostic Studies:  None  Discharge Vitals:   Blood pressure 113/82, pulse 112, temperature 97.7 F (36.5 C), temperature source Oral, resp. rate 20, height 5\' 8"  (1.727 m), weight 72.576 kg (160 lb), SpO2 99 %. Body mass index is 24.33 kg/(m^2). Lab Results:   Results for orders placed or performed during the hospital encounter of 07/03/15 (from the past 72 hour(s))  Glucose, capillary     Status: Abnormal   Collection Time: 07/06/15 11:53 AM  Result Value Ref Range   Glucose-Capillary 174 (H) 65 - 99 mg/dL  Valproic acid level     Status: None   Collection Time: 07/06/15  2:52 PM  Result Value Ref Range   Valproic Acid Lvl 85 50.0 - 100.0 ug/mL  Glucose, capillary  Status: Abnormal   Collection Time: 07/06/15  8:32 PM  Result Value Ref Range   Glucose-Capillary 313 (H) 65 - 99 mg/dL  Glucose, capillary     Status: Abnormal   Collection Time: 07/07/15  6:41 AM  Result Value Ref Range   Glucose-Capillary 151 (H) 65 - 99 mg/dL   Comment 1 Notify RN   Valproic acid level     Status: None   Collection Time: 07/07/15  6:55 AM  Result Value Ref Range   Valproic Acid Lvl 70 50.0 - 100.0 ug/mL  Glucose, capillary     Status: Abnormal   Collection Time: 07/07/15  4:40 PM  Result Value Ref Range   Glucose-Capillary 195 (H) 65 - 99 mg/dL  Glucose, capillary     Status: Abnormal   Collection Time: 07/08/15 10:37 AM  Result Value Ref Range   Glucose-Capillary 301 (H) 65 - 99 mg/dL  Glucose, capillary     Status: Abnormal   Collection Time: 07/08/15 11:59 AM  Result Value Ref Range   Glucose-Capillary 176 (H) 65 - 99 mg/dL  Glucose, capillary     Status: Abnormal   Collection Time: 07/08/15  4:15 PM  Result Value Ref Range   Glucose-Capillary 223 (H) 65 - 99 mg/dL   Glucose, capillary     Status: Abnormal   Collection Time: 07/08/15  8:29 PM  Result Value Ref Range   Glucose-Capillary 172 (H) 65 - 99 mg/dL  Glucose, capillary     Status: Abnormal   Collection Time: 07/09/15  6:50 AM  Result Value Ref Range   Glucose-Capillary 154 (H) 65 - 99 mg/dL   Comment 1 Notify RN     Physical Findings: AIMS: Facial and Oral Movements Muscles of Facial Expression: None, normal Lips and Perioral Area: None, normal Jaw: None, normal Tongue: None, normal,Extremity Movements Upper (arms, wrists, hands, fingers): None, normal Lower (legs, knees, ankles, toes): None, normal, Trunk Movements Neck, shoulders, hips: None, normal, Overall Severity Severity of abnormal movements (highest score from questions above): None, normal Incapacitation due to abnormal movements: None, normal Patient's awareness of abnormal movements (rate only patient's report): No Awareness, Dental Status Current problems with teeth and/or dentures?: Yes Does patient usually wear dentures?: Yes  CIWA:  CIWA-Ar Total: 0 COWS:  COWS Total Score: 2   See Psychiatric Specialty Exam and Suicide Risk Assessment completed by Attending Physician prior to discharge.  Discharge destination:  Home  Is patient on multiple antipsychotic therapies at discharge:  Yes,   Do you recommend tapering to monotherapy for antipsychotics?  Yes   Has Patient had three or more failed trials of antipsychotic monotherapy by history:  No    Recommended Plan for Multiple Antipsychotic Therapies: Taper to monotherapy as described:  Discontinue Risperdal and continue [place] injections.  Discharge Instructions    Diet - low sodium heart healthy    Complete by:  As directed      Increase activity slowly    Complete by:  As directed             Medication List    STOP taking these medications        divalproex 500 MG 24 hr tablet  Commonly known as:  DEPAKOTE ER  Replaced by:  divalproex 500  MG DR tablet     risperiDONE microspheres 50 MG injection  Commonly known as:  RISPERDAL CONSTA      TAKE these medications      Indication   clonazePAM 0.5  MG tablet  Commonly known as:  KLONOPIN  Take 0.5 mg by mouth 2 (two) times daily.      divalproex 500 MG DR tablet  Commonly known as:  DEPAKOTE  Take 1 tablet (500 mg total) by mouth 3 (three) times daily.   Indication:  Manic Phase of Manic-Depression     glipiZIDE 10 MG 24 hr tablet  Commonly known as:  GLUCOTROL XL  Take 10 mg by mouth 2 (two) times daily.      levothyroxine 50 MCG tablet  Commonly known as:  SYNTHROID, LEVOTHROID  Take 50 mcg by mouth daily.      lisinopril 5 MG tablet  Commonly known as:  PRINIVIL,ZESTRIL  Take 1 tablet (5 mg total) by mouth daily.   Indication:  High Blood Pressure     metFORMIN 500 MG 24 hr tablet  Commonly known as:  GLUCOPHAGE-XR  Take 1,000 mg by mouth 2 (two) times daily.      paliperidone 234 MG/1.5ML Susp injection  Commonly known as:  INVEGA SUSTENNA  Inject 234 mg into the muscle once.  Start taking on:  07/25/2015   Indication:  Schizoaffective Disorder     quinapril 5 MG tablet  Commonly known as:  ACCUPRIL  Take 5 mg by mouth daily.      risperiDONE 3 MG tablet  Commonly known as:  RISPERDAL  Take 6 mg by mouth at bedtime.      simvastatin 20 MG tablet  Commonly known as:  ZOCOR  Take 20 mg by mouth at bedtime.      sitaGLIPtin 100 MG tablet  Commonly known as:  JANUVIA  Take 100 mg by mouth daily.          Follow-up recommendations:  Activity:  As tolerated. Diet:  Low sodium heart healthy ADA diet. Other:  Keep follow-up appointments.  Comments:    Total Discharge Time: 35 min.  Signed: Kristine LineaJolanta Kendarious Gudino 07/09/2015, 9:46 AM

## 2015-07-09 NOTE — [facility] Group Notes (Signed)
[facility] Group Notes:  (Nursing/MHT/Case Management/Adjunct)  Date:  07/09/2015  Time:  12:35 PM  Type of Therapy:  Psychoeducational Skills  Participation Level:  Did Not Attend   Justin Weeks 07/09/2015, 12:35 PM

## 2015-07-12 LAB — GLUCOSE, CAPILLARY
GLUCOSE-CAPILLARY: 206 mg/dL — AB (ref 65–99)
Glucose-Capillary: 94 mg/dL (ref 65–99)

## 2015-07-13 LAB — GLUCOSE, CAPILLARY: GLUCOSE-CAPILLARY: 173 mg/dL — AB (ref 65–99)

## 2015-12-24 ENCOUNTER — Telehealth: Payer: Self-pay | Admitting: *Deleted

## 2015-12-24 NOTE — Telephone Encounter (Signed)
Spoke to patient this morning regarding lung cancer screening low dose CT scans. Patient does remember having this in the past. He states he is not interested in having any further screening CT scans for lung cancer.

## 2016-08-21 ENCOUNTER — Other Ambulatory Visit: Payer: Self-pay | Admitting: Family Medicine

## 2016-08-21 DIAGNOSIS — R918 Other nonspecific abnormal finding of lung field: Secondary | ICD-10-CM

## 2016-08-29 ENCOUNTER — Ambulatory Visit
Admission: RE | Admit: 2016-08-29 | Discharge: 2016-08-29 | Disposition: A | Discharge status: Home / Self Care | Payer: Medicare Other | Source: Ambulatory Visit | Attending: Family Medicine | Admitting: Family Medicine

## 2016-08-29 DIAGNOSIS — R918 Other nonspecific abnormal finding of lung field: Secondary | ICD-10-CM | POA: Insufficient documentation

## 2016-12-01 ENCOUNTER — Encounter: Payer: Self-pay | Admitting: Emergency Medicine

## 2016-12-01 ENCOUNTER — Emergency Department
Admission: EM | Admit: 2016-12-01 | Discharge: 2016-12-01 | Disposition: A | Discharge status: Home / Self Care | Payer: Medicare Other | Attending: Emergency Medicine | Admitting: Emergency Medicine

## 2016-12-01 DIAGNOSIS — J45909 Unspecified asthma, uncomplicated: Secondary | ICD-10-CM | POA: Diagnosis not present

## 2016-12-01 DIAGNOSIS — I10 Essential (primary) hypertension: Secondary | ICD-10-CM | POA: Insufficient documentation

## 2016-12-01 DIAGNOSIS — M7631 Iliotibial band syndrome, right leg: Secondary | ICD-10-CM | POA: Diagnosis not present

## 2016-12-01 DIAGNOSIS — E119 Type 2 diabetes mellitus without complications: Secondary | ICD-10-CM | POA: Diagnosis not present

## 2016-12-01 DIAGNOSIS — M79604 Pain in right leg: Secondary | ICD-10-CM | POA: Diagnosis present

## 2016-12-01 DIAGNOSIS — E039 Hypothyroidism, unspecified: Secondary | ICD-10-CM | POA: Diagnosis not present

## 2016-12-01 DIAGNOSIS — Z7984 Long term (current) use of oral hypoglycemic drugs: Secondary | ICD-10-CM | POA: Diagnosis not present

## 2016-12-01 DIAGNOSIS — F17200 Nicotine dependence, unspecified, uncomplicated: Secondary | ICD-10-CM | POA: Diagnosis not present

## 2016-12-01 MED ORDER — NAPROXEN 500 MG PO TABS
500.0000 mg | ORAL_TABLET | Freq: Once | ORAL | Status: AC
  Administered 2016-12-01: 500 mg via ORAL
  Filled 2016-12-01: qty 1

## 2016-12-01 MED ORDER — NAPROXEN 500 MG PO TBEC: 500.0000 mg | DELAYED_RELEASE_TABLET | Freq: Two times a day (BID) | ORAL | 0 refills | Status: DC

## 2016-12-01 NOTE — ED Triage Notes (Addendum)
Pt from [facility] via [facility]. Pt c/o right lower leg pain x3 weeks. Pt ambulatory to triage.

## 2016-12-01 NOTE — ED Provider Notes (Signed)
[facility] Emergency Department Provider Note ____________________________________________  Time seen: 1121  I have reviewed the triage vital signs and the nursing notes.  HISTORY  Chief Complaint  Leg Pain  HPI Justin Weeks is a 56 y.o. male presents to the ED, via EMS, for evaluation of right lateral leg pain for several weeks. Patient denies any injury, accident, trauma, fall. He describes pain to the lateral aspect of his thigh and calf related to having to walk up several flights of concrete steps. He describes several flights of concrete steps going into his apartment where he lives alone. He reports over the last few weeks he's had increasing pain to the lateral leg and thigh, that have not resolved completely with Ace bandage or heat patch application. He denies taking any medications for his symptoms. He denies any previous history of leg pain or any ongoing knee problems. He has not seen his primary care provider for evaluation of symptoms. He denies any catch, click, LOC, or give way to the right knee.  Past Medical History:  Diagnosis Date  . Arthritis   . Asthma   . Blood transfusion without reported diagnosis   . Diabetes mellitus without complication (HCC)   . Hypertension   . Hypothyroid 07/03/2015  . Personal history of tobacco use, presenting hazards to health 05/31/2015  . Seizures [facility])     Patient Active Problem List   Diagnosis Date Noted  . Schizoaffective disorder, bipolar type (HCC) 07/04/2015  . HTN (hypertension) 07/04/2015  . Dyslipidemia 07/04/2015  . Tobacco use disorder 07/04/2015  . Noncompliance 07/03/2015  . Hypothyroidism 07/03/2015  . Diabetes (HCC) 04/20/2015    No past surgical history on file.  Prior to Admission medications   Medication Sig Start Date End Date Taking? Authorizing Provider  clonazePAM (KLONOPIN) 0.5 MG tablet Take 0.5 mg by mouth 2 (two) times daily.     Historical Provider, MD  divalproex  (DEPAKOTE) 500 MG DR tablet Take 1 tablet (500 mg total) by mouth 3 (three) times daily. 07/09/15   Shari Prows, MD  glipiZIDE (GLUCOTROL XL) 10 MG 24 hr tablet Take 10 mg by mouth 2 (two) times daily.    Historical Provider, MD  levothyroxine (SYNTHROID, LEVOTHROID) 50 MCG tablet Take 50 mcg by mouth daily.     Historical Provider, MD  lisinopril (PRINIVIL,ZESTRIL) 5 MG tablet Take 1 tablet (5 mg total) by mouth daily. 07/09/15   Shari Prows, MD  metFORMIN (GLUCOPHAGE-XR) 500 MG 24 hr tablet Take 1,000 mg by mouth 2 (two) times daily.     Historical Provider, MD  naproxen (EC NAPROSYN) 500 MG EC tablet Take 1 tablet (500 mg total) by mouth 2 (two) times daily with a meal. 12/01/16   Tito Ausmus V Bacon Kennedie Pardoe, PA-C  paliperidone (INVEGA SUSTENNA) 234 MG/1.5ML SUSP injection Inject 234 mg into the muscle once. 07/25/15   Shari Prows, MD  quinapril (ACCUPRIL) 5 MG tablet Take 5 mg by mouth daily.     Historical Provider, MD  risperiDONE (RISPERDAL) 3 MG tablet Take 6 mg by mouth at bedtime.     Historical Provider, MD  simvastatin (ZOCOR) 20 MG tablet Take 20 mg by mouth at bedtime.     Historical Provider, MD  sitaGLIPtin (JANUVIA) 100 MG tablet Take 100 mg by mouth daily.    Historical Provider, MD    Allergies Haldol [haloperidol]  No family history on file.  Social History Social History  Substance Use Topics  .  Smoking status: Current Every Day Smoker    Packs/day: 1.00    Years: 40.00  . Smokeless tobacco: Not on file  . Alcohol use No    Review of Systems  Constitutional: Negative for fever. Cardiovascular: Negative for chest pain. Respiratory: Negative for shortness of breath. Musculoskeletal: Negative for back pain. Right leg pain as above Skin: Negative for rash. Neurological: Negative for headaches, focal weakness or numbness. ____________________________________________  PHYSICAL EXAM:  VITAL SIGNS: ED Triage Vitals  Enc Vitals Group     BP  12/01/16 1056 107/67     Pulse Rate 12/01/16 1056 100     Resp 12/01/16 1056 16     Temp 12/01/16 1056 97.9 F (36.6 C)     Temp Source 12/01/16 1056 Oral     SpO2 12/01/16 1056 96 %     Weight 12/01/16 1029 171 lb (77.6 kg)     Height 12/01/16 1029  (1.727 m)     Head Circumference --      Peak Flow --      Pain Score 12/01/16 1028 7     Pain Loc --      Pain Edu? --      Excl. in GC? --     Constitutional: Alert and oriented. Well appearing and in no distress. Head: Normocephalic and atraumatic. Cardiovascular: Normal rate, regular rhythm. Normal distal pulses. Respiratory: Normal respiratory effort. No wheezes/rales/rhonchi. Musculoskeletal: Right lower extremity without obvious deformity, dislocation, or leg length discrepancy. The right knee is without effusion, abrasion, erythema, or deformity. Normal active range of motion of the knee without internal derangement. No valgus or varus joint stress. Patient is tender to palpation to the insertion of the right ITB when the knee is in full flexion. No popliteal space fullness is noted. No calf or Achilles tenderness is noted distally. Normal ankle exam. Nontender with normal range of motion in all extremities.  Neurologic:  Normal gait without ataxia. Normal speech and language. No gross focal neurologic deficits are appreciated. Skin:  Skin is warm, dry and intact. No rash noted. Psychiatric: Mood and affect are normal. Patient exhibits appropriate insight and judgment. ____________________________________________  PROCEDURES  Naproxen 500 mg PO ____________________________________________  INITIAL IMPRESSION / ASSESSMENT AND PLAN / ED COURSE  Patient was appears to be a ITB syndrome on the right leg. His exam is otherwise benign at this time. He is discharged with a prescription for naproxen to dose as directed. Instructions on management of ITB syndrome as well as some exercises are provided. He will follow with his  primary care provider for ongoing symptom management. ____________________________________________  FINAL CLINICAL IMPRESSION(S) / ED DIAGNOSES  Final diagnoses:  Iliotibial band syndrome of right side      Lissa Hoard, PA-C 12/01/16 1155    Emily Filbert, MD 12/01/16 1312

## 2016-12-01 NOTE — Discharge Instructions (Signed)
Your exam shows some strain to the leader from the side of the thigh to the side of the knee. It can become tight hurt with climbing, bending, and standing up. Take the prescription pain & inflammation pill as directed. Put an ice pack on the leg to reduce pain. Follow-up with Dr. Letta Pate for continued pain. The exercises may help.

## 2016-12-01 NOTE — ED Notes (Signed)
See triage note  States he is having right leg pain for about 3 weeks  Denies any injury states pain to lateral aspect on leg  No swelling noted  Ambulates well to treatment

## 2017-01-05 ENCOUNTER — Encounter: Payer: Self-pay | Admitting: Emergency Medicine

## 2017-01-05 ENCOUNTER — Emergency Department
Admission: EM | Admit: 2017-01-05 | Discharge: 2017-01-05 | Disposition: A | Discharge status: Home / Self Care | Payer: Medicare Other | Attending: Emergency Medicine | Admitting: Emergency Medicine

## 2017-01-05 DIAGNOSIS — G8929 Other chronic pain: Secondary | ICD-10-CM | POA: Insufficient documentation

## 2017-01-05 DIAGNOSIS — F17200 Nicotine dependence, unspecified, uncomplicated: Secondary | ICD-10-CM | POA: Diagnosis not present

## 2017-01-05 DIAGNOSIS — E119 Type 2 diabetes mellitus without complications: Secondary | ICD-10-CM | POA: Diagnosis not present

## 2017-01-05 DIAGNOSIS — J45909 Unspecified asthma, uncomplicated: Secondary | ICD-10-CM | POA: Diagnosis not present

## 2017-01-05 DIAGNOSIS — Z79899 Other long term (current) drug therapy: Secondary | ICD-10-CM | POA: Insufficient documentation

## 2017-01-05 DIAGNOSIS — Z7984 Long term (current) use of oral hypoglycemic drugs: Secondary | ICD-10-CM | POA: Insufficient documentation

## 2017-01-05 DIAGNOSIS — E039 Hypothyroidism, unspecified: Secondary | ICD-10-CM | POA: Insufficient documentation

## 2017-01-05 DIAGNOSIS — M25561 Pain in right knee: Secondary | ICD-10-CM | POA: Diagnosis not present

## 2017-01-05 DIAGNOSIS — Z760 Encounter for issue of repeat prescription: Secondary | ICD-10-CM | POA: Insufficient documentation

## 2017-01-05 DIAGNOSIS — I10 Essential (primary) hypertension: Secondary | ICD-10-CM | POA: Insufficient documentation

## 2017-01-05 MED ORDER — NAPROXEN 500 MG PO TABS: 500.0000 mg | ORAL_TABLET | Freq: Two times a day (BID) | ORAL | 2 refills | Status: AC

## 2017-01-05 NOTE — ED Triage Notes (Signed)
Pt reports continued right knee pain, reports he's out of his pain medication he has been taking for it. Pt states "It starts with a na".

## 2017-01-05 NOTE — ED Provider Notes (Signed)
[facility] Emergency Department Provider Note   ____________________________________________   None    (approximate)  I have reviewed the triage vital signs and the nursing notes.   HISTORY  Chief Complaint Knee Pain and Medication Refill    HPI Justin Weeks is a 56 y.o. male patient's request refills of naproxen for his chronic knee pain. Patient has not follow-up with PCP as directed in this last visit to the ED.Patient stated the pain has increased over the past 3 days but has been out of medication for over a week and a half. Patient rates his pain as a 6/10. Patient described it as "achy". No other palliative measures for his complaint.   Past Medical History:  Diagnosis Date  . Arthritis   . Asthma   . Blood transfusion without reported diagnosis   . Diabetes mellitus without complication (HCC)   . Hypertension   . Hypothyroid 07/03/2015  . Personal history of tobacco use, presenting hazards to health 05/31/2015  . Seizures [facility])     Patient Active Problem List   Diagnosis Date Noted  . Schizoaffective disorder, bipolar type (HCC) 07/04/2015  . HTN (hypertension) 07/04/2015  . Dyslipidemia 07/04/2015  . Tobacco use disorder 07/04/2015  . Noncompliance 07/03/2015  . Hypothyroidism 07/03/2015  . Diabetes (HCC) 04/20/2015    No past surgical history on file.  Prior to Admission medications   Medication Sig Start Date End Date Taking? Authorizing Provider  clonazePAM (KLONOPIN) 0.5 MG tablet Take 0.5 mg by mouth 2 (two) times daily.     [provider]  divalproex (DEPAKOTE) 500 MG DR tablet Take 1 tablet (500 mg total) by mouth 3 (three) times daily. 07/09/15   Pucilowska, Jolanta B, MD  glipiZIDE (GLUCOTROL XL) 10 MG 24 hr tablet Take 10 mg by mouth 2 (two) times daily.    [provider]  levothyroxine (SYNTHROID, LEVOTHROID) 50 MCG tablet Take 50 mcg by mouth daily.     [provider]  lisinopril  (PRINIVIL,ZESTRIL) 5 MG tablet Take 1 tablet (5 mg total) by mouth daily. 07/09/15   Pucilowska, Jolanta B, MD  metFORMIN (GLUCOPHAGE-XR) 500 MG 24 hr tablet Take 1,000 mg by mouth 2 (two) times daily.     [provider]  naproxen (EC NAPROSYN) 500 MG EC tablet Take 1 tablet (500 mg total) by mouth 2 (two) times daily with a meal. 12/01/16   Menshew, Charlesetta Ivory, PA-C  naproxen (NAPROSYN) 500 MG tablet Take 1 tablet (500 mg total) by mouth 2 (two) times daily with a meal. 01/05/17 01/05/18  Joni Reining, PA-C  paliperidone (INVEGA SUSTENNA) 234 MG/1.5ML SUSP injection Inject 234 mg into the muscle once. 07/25/15   Pucilowska, Braulio Conte B, MD  quinapril (ACCUPRIL) 5 MG tablet Take 5 mg by mouth daily.     [provider]  risperiDONE (RISPERDAL) 3 MG tablet Take 6 mg by mouth at bedtime.     [provider]  simvastatin (ZOCOR) 20 MG tablet Take 20 mg by mouth at bedtime.     [provider]  sitaGLIPtin (JANUVIA) 100 MG tablet Take 100 mg by mouth daily.    [provider]    Allergies Haldol [haloperidol]  No family history on file.  Social History Social History  Substance Use Topics  . Smoking status: Current Every Day Smoker    Packs/day: 1.00    Years: 40.00  . Smokeless tobacco: Not on file  . Alcohol use No  Review of Systems  Constitutional: No fever/chills Eyes: No visual changes. ENT: No sore throat. Cardiovascular: Denies chest pain. Respiratory: Denies shortness of breath. Gastrointestinal: No abdominal pain.  No nausea, no vomiting.  No diarrhea.  No constipation. Genitourinary: Negative for dysuria. Musculoskeletal: Negative for back pain. Skin: Negative for rash. Neurological: Negative for headaches, focal weakness or numbness. Seizure disorder Endocrine:Diabetes, hypertension, hypothyroidism. Allergic/Immunilogical: See medication list ____________________________________________   PHYSICAL EXAM:  VITAL  SIGNS: ED Triage Vitals  Enc Vitals Group     BP 01/05/17 0905 130/86     Pulse Rate 01/05/17 0905 98     Resp 01/05/17 0905 16     Temp 01/05/17 0905 97.6 F (36.4 C)     Temp Source 01/05/17 0905 Oral     SpO2 01/05/17 0905 99 %     Weight 01/05/17 0906 169 lb (76.7 kg)     Height 01/05/17 0906 5\' 8"  (1.727 m)     Head Circumference --      Peak Flow --      Pain Score 01/05/17 0905 6     Pain Loc --      Pain Edu? --      Excl. in GC? --     Constitutional: Alert and oriented. Well appearing and in no acute distress. Eyes: Conjunctivae are normal. PERRL. EOMI. Head: Atraumatic. Nose: No congestion/rhinnorhea. Mouth/Throat: Mucous membranes are moist.  Oropharynx non-erythematous. Neck: No stridor.  No cervical spine tenderness to palpation. Hematological/Lymphatic/Immunilogical: No cervical lymphadenopathy. Cardiovascular: Normal rate, regular rhythm. Grossly normal heart sounds.  Good peripheral circulation. Respiratory: Normal respiratory effort.  No retractions. Lungs CTAB. Gastrointestinal: Soft and nontender. No distention. No abdominal bruits. No CVA tenderness. Musculoskeletal: No lower extremity tenderness nor edema.  No joint effusions. Neurologic:  Normal speech and language. No gross focal neurologic deficits are appreciated. No gait instability. Skin:  Skin is warm, dry and intact. No rash noted. Psychiatric: Mood and affect are normal. Speech and behavior are normal.  ____________________________________________   LABS (all labs ordered are listed, but only abnormal results are displayed)  Labs Reviewed - No data to display ____________________________________________  EKG   ____________________________________________  RADIOLOGY   ____________________________________________   PROCEDURES  Procedure(s) performed: None  Procedures  Critical Care performed: No  ____________________________________________   INITIAL IMPRESSION / ASSESSMENT  AND PLAN / ED COURSE  Pertinent labs & imaging results that were available during my care of the patient were reviewed by me and considered in my medical decision making (see chart for details).  Medication refill for chronic knee pain. Patient given discharge care instructions. Patient advised follow-up with PCP for continued care.      ____________________________________________   FINAL CLINICAL IMPRESSION(S) / ED DIAGNOSES  Final diagnoses:  Encounter for medication refill  Chronic pain of right knee      NEW MEDICATIONS STARTED DURING THIS VISIT:  New Prescriptions   NAPROXEN (NAPROSYN) 500 MG TABLET    Take 1 tablet (500 mg total) by mouth 2 (two) times daily with a meal.     Note:  This document was prepared using Dragon voice recognition software and may include unintentional dictation errors.    Joni ReiningSmith, Kasin Tonkinson K, PA-C 01/05/17 1032    Emily FilbertWilliams, Jonathan E, MD 01/05/17 1116

## 2017-10-08 ENCOUNTER — Telehealth: Payer: Self-pay | Admitting: *Deleted

## 2017-10-08 NOTE — Telephone Encounter (Signed)
Received re referral for lung cancer screening. Attempted to contact patient however no voicemail option is available at the number listed in EMR. Will attempt at a later date.

## 2017-10-21 ENCOUNTER — Telehealth: Payer: Self-pay | Admitting: *Deleted

## 2017-10-21 NOTE — Telephone Encounter (Signed)
Attempted to contact patient to discuss lung screening scan. However, no voicemail option is available at # listed in EMR. Will mail information.

## 2017-10-29 ENCOUNTER — Encounter: Payer: Self-pay | Admitting: *Deleted

## 2019-01-03 DIAGNOSIS — R413 Other amnesia: Secondary | ICD-10-CM | POA: Diagnosis present

## 2019-10-17 ENCOUNTER — Other Ambulatory Visit: Payer: Self-pay

## 2019-10-17 ENCOUNTER — Inpatient Hospital Stay
Admission: AD | Admit: 2019-10-17 | Discharge: 2019-10-19 | DRG: 885 | Disposition: A | Discharge status: Home / Self Care | Payer: Medicare Other | Source: Intra-hospital | Attending: Psychiatry | Admitting: Psychiatry

## 2019-10-17 ENCOUNTER — Encounter: Payer: Self-pay | Admitting: Psychiatry

## 2019-10-17 ENCOUNTER — Emergency Department
Admission: EM | Admit: 2019-10-17 | Discharge: 2019-10-17 | Disposition: A | Discharge status: Other Acute Inpatient Hospital | Payer: Medicare Other | Attending: Emergency Medicine | Admitting: Emergency Medicine

## 2019-10-17 DIAGNOSIS — J45909 Unspecified asthma, uncomplicated: Secondary | ICD-10-CM | POA: Diagnosis present

## 2019-10-17 DIAGNOSIS — E119 Type 2 diabetes mellitus without complications: Secondary | ICD-10-CM | POA: Insufficient documentation

## 2019-10-17 DIAGNOSIS — F329 Major depressive disorder, single episode, unspecified: Secondary | ICD-10-CM

## 2019-10-17 DIAGNOSIS — E039 Hypothyroidism, unspecified: Secondary | ICD-10-CM | POA: Diagnosis present

## 2019-10-17 DIAGNOSIS — Z7984 Long term (current) use of oral hypoglycemic drugs: Secondary | ICD-10-CM | POA: Insufficient documentation

## 2019-10-17 DIAGNOSIS — Z885 Allergy status to narcotic agent status: Secondary | ICD-10-CM

## 2019-10-17 DIAGNOSIS — F0280 Dementia in other diseases classified elsewhere without behavioral disturbance: Secondary | ICD-10-CM | POA: Diagnosis present

## 2019-10-17 DIAGNOSIS — Z79899 Other long term (current) drug therapy: Secondary | ICD-10-CM

## 2019-10-17 DIAGNOSIS — I10 Essential (primary) hypertension: Secondary | ICD-10-CM | POA: Diagnosis present

## 2019-10-17 DIAGNOSIS — F259 Schizoaffective disorder, unspecified: Secondary | ICD-10-CM | POA: Diagnosis present

## 2019-10-17 DIAGNOSIS — F17210 Nicotine dependence, cigarettes, uncomplicated: Secondary | ICD-10-CM | POA: Diagnosis present

## 2019-10-17 DIAGNOSIS — G309 Alzheimer's disease, unspecified: Secondary | ICD-10-CM | POA: Diagnosis present

## 2019-10-17 DIAGNOSIS — Z79890 Hormone replacement therapy (postmenopausal): Secondary | ICD-10-CM

## 2019-10-17 DIAGNOSIS — R45851 Suicidal ideations: Secondary | ICD-10-CM | POA: Diagnosis present

## 2019-10-17 DIAGNOSIS — F32A Depression, unspecified: Secondary | ICD-10-CM

## 2019-10-17 DIAGNOSIS — F17200 Nicotine dependence, unspecified, uncomplicated: Secondary | ICD-10-CM | POA: Insufficient documentation

## 2019-10-17 DIAGNOSIS — F250 Schizoaffective disorder, bipolar type: Secondary | ICD-10-CM

## 2019-10-17 DIAGNOSIS — G4700 Insomnia, unspecified: Secondary | ICD-10-CM | POA: Diagnosis present

## 2019-10-17 LAB — URINE DRUG SCREEN, QUALITATIVE (ARMC ONLY)
Amphetamines, Ur Screen: NOT DETECTED
Barbiturates, Ur Screen: NOT DETECTED
Benzodiazepine, Ur Scrn: NOT DETECTED
Cannabinoid 50 Ng, Ur ~~LOC~~: NOT DETECTED
Cocaine Metabolite,Ur ~~LOC~~: POSITIVE — AB
MDMA (Ecstasy)Ur Screen: NOT DETECTED
Methadone Scn, Ur: NOT DETECTED
Opiate, Ur Screen: NOT DETECTED
Phencyclidine (PCP) Ur S: NOT DETECTED
Tricyclic, Ur Screen: NOT DETECTED

## 2019-10-17 LAB — COMPREHENSIVE METABOLIC PANEL
ALT: 10 U/L (ref 0–44)
AST: 18 U/L (ref 15–41)
Albumin: 3.6 g/dL (ref 3.5–5.0)
Alkaline Phosphatase: 60 U/L (ref 38–126)
Anion gap: 8 (ref 5–15)
BUN: 9 mg/dL (ref 8–23)
CO2: 29 mmol/L (ref 22–32)
Calcium: 9 mg/dL (ref 8.9–10.3)
Chloride: 101 mmol/L (ref 98–111)
Creatinine, Ser: 1.23 mg/dL (ref 0.61–1.24)
GFR calc Af Amer: >60 mL/min (ref >60)
GFR calc non Af Amer: >60 mL/min (ref >60)
Glucose, Bld: 140 mg/dL — ABNORMAL HIGH (ref 70–99)
Potassium: 3.7 mmol/L (ref 3.5–5.1)
Sodium: 138 mmol/L (ref 135–145)
Total Bilirubin: 0.4 mg/dL (ref 0.3–1.2)
Total Protein: 7.2 g/dL (ref 6.5–8.1)

## 2019-10-17 LAB — CBC
HCT: 38.7 % — ABNORMAL LOW (ref 39.0–52.0)
Hemoglobin: 13 g/dL (ref 13.0–17.0)
MCH: 28.6 pg (ref 26.0–34.0)
MCHC: 33.6 g/dL (ref 30.0–36.0)
MCV: 85.2 fL (ref 80.0–100.0)
Platelets: 162 10*3/uL (ref 150–400)
RBC: 4.54 MIL/uL (ref 4.22–5.81)
RDW: 13.6 % (ref 11.5–15.5)
WBC: 4.2 10*3/uL (ref 4.0–10.5)
nRBC: 0 % (ref 0.0–0.2)

## 2019-10-17 LAB — ACETAMINOPHEN LEVEL: Acetaminophen (Tylenol), Serum: <10 ug/mL — ABNORMAL LOW (ref 10–30)

## 2019-10-17 LAB — GLUCOSE, CAPILLARY: Glucose-Capillary: 126 mg/dL — ABNORMAL HIGH (ref 70–99)

## 2019-10-17 LAB — VALPROIC ACID LEVEL: Valproic Acid Lvl: 66 ug/mL (ref 50.0–100.0)

## 2019-10-17 LAB — ETHANOL: Alcohol, Ethyl (B): <10 mg/dL (ref <10)

## 2019-10-17 LAB — SALICYLATE LEVEL: Salicylate Lvl: <7 mg/dL — ABNORMAL LOW (ref 7.0–30.0)

## 2019-10-17 MED ORDER — CLONAZEPAM 0.5 MG PO TABS
0.5000 mg | ORAL_TABLET | Freq: Two times a day (BID) | ORAL | Status: DC
  Administered 2019-10-17 – 2019-10-19 (×4): 0.5 mg via ORAL
  Filled 2019-10-17 (×4): qty 1

## 2019-10-17 MED ORDER — DIVALPROEX SODIUM ER 250 MG PO TB24: 250.0000 mg | ORAL_TABLET | Freq: Two times a day (BID) | ORAL | Status: DC

## 2019-10-17 MED ORDER — SIMVASTATIN 40 MG PO TABS
20.0000 mg | ORAL_TABLET | Freq: Every day | ORAL | Status: DC
  Administered 2019-10-17 – 2019-10-18 (×2): 20 mg via ORAL
  Filled 2019-10-17 (×2): qty 1

## 2019-10-17 MED ORDER — DIVALPROEX SODIUM ER 250 MG PO TB24: 750.0000 mg | ORAL_TABLET | Freq: Every day | ORAL | Status: DC

## 2019-10-17 MED ORDER — SIMVASTATIN 10 MG PO TABS: 20.0000 mg | ORAL_TABLET | Freq: Every day | ORAL | Status: DC

## 2019-10-17 MED ORDER — PALIPERIDONE ER 3 MG PO TB24
9.0000 mg | ORAL_TABLET | Freq: Every day | ORAL | Status: DC
  Administered 2019-10-17: 9 mg via ORAL
  Filled 2019-10-17: qty 3

## 2019-10-17 MED ORDER — METFORMIN HCL ER 500 MG PO TB24
1000.0000 mg | ORAL_TABLET | Freq: Two times a day (BID) | ORAL | Status: DC
  Administered 2019-10-17 – 2019-10-19 (×4): 1000 mg via ORAL
  Filled 2019-10-17 (×6): qty 2

## 2019-10-17 MED ORDER — DIVALPROEX SODIUM ER 500 MG PO TB24
750.0000 mg | ORAL_TABLET | Freq: Every day | ORAL | Status: DC
  Administered 2019-10-17 – 2019-10-18 (×2): 750 mg via ORAL
  Filled 2019-10-17 (×4): qty 1

## 2019-10-17 MED ORDER — GLIPIZIDE ER 5 MG PO TB24
10.0000 mg | ORAL_TABLET | Freq: Two times a day (BID) | ORAL | Status: DC
  Administered 2019-10-17 – 2019-10-19 (×4): 10 mg via ORAL
  Filled 2019-10-17 (×4): qty 2

## 2019-10-17 MED ORDER — METFORMIN HCL ER 500 MG PO TB24
1000.0000 mg | ORAL_TABLET | Freq: Two times a day (BID) | ORAL | Status: DC
  Filled 2019-10-17: qty 2

## 2019-10-17 MED ORDER — GLIPIZIDE ER 10 MG PO TB24
10.0000 mg | ORAL_TABLET | Freq: Two times a day (BID) | ORAL | Status: DC
  Administered 2019-10-17: 10 mg via ORAL
  Filled 2019-10-17 (×2): qty 1

## 2019-10-17 MED ORDER — LINAGLIPTIN 5 MG PO TABS
5.0000 mg | ORAL_TABLET | Freq: Every day | ORAL | Status: DC
  Administered 2019-10-18 – 2019-10-19 (×2): 5 mg via ORAL
  Filled 2019-10-17 (×2): qty 1

## 2019-10-17 MED ORDER — ACETAMINOPHEN 325 MG PO TABS: 650.0000 mg | ORAL_TABLET | Freq: Four times a day (QID) | ORAL | Status: DC | PRN

## 2019-10-17 MED ORDER — LINAGLIPTIN 5 MG PO TABS
5.0000 mg | ORAL_TABLET | Freq: Every day | ORAL | Status: DC
  Administered 2019-10-17: 13:00:00 5 mg via ORAL
  Filled 2019-10-17: qty 1

## 2019-10-17 MED ORDER — CLONAZEPAM 0.5 MG PO TABS
0.5000 mg | ORAL_TABLET | Freq: Two times a day (BID) | ORAL | Status: DC
  Administered 2019-10-17: 0.5 mg via ORAL
  Filled 2019-10-17: qty 1

## 2019-10-17 MED ORDER — PALIPERIDONE ER 3 MG PO TB24
9.0000 mg | ORAL_TABLET | Freq: Every morning | ORAL | Status: DC
  Administered 2019-10-18 – 2019-10-19 (×2): 9 mg via ORAL
  Filled 2019-10-17 (×2): qty 3

## 2019-10-17 MED ORDER — LEVOTHYROXINE SODIUM 50 MCG PO TABS
50.0000 ug | ORAL_TABLET | Freq: Every day | ORAL | Status: DC
  Filled 2019-10-17: qty 1

## 2019-10-17 MED ORDER — LEVOTHYROXINE SODIUM 50 MCG PO TABS
50.0000 ug | ORAL_TABLET | Freq: Every day | ORAL | Status: DC
  Administered 2019-10-18 – 2019-10-19 (×2): 50 ug via ORAL
  Filled 2019-10-17 (×2): qty 1

## 2019-10-17 MED ORDER — DIVALPROEX SODIUM ER 250 MG PO TB24
250.0000 mg | ORAL_TABLET | Freq: Every day | ORAL | Status: DC
  Administered 2019-10-17: 250 mg via ORAL
  Filled 2019-10-17: qty 1

## 2019-10-17 MED ORDER — DONEPEZIL HCL 5 MG PO TABS
10.0000 mg | ORAL_TABLET | Freq: Every day | ORAL | Status: DC
  Administered 2019-10-17: 10 mg via ORAL
  Filled 2019-10-17: qty 2

## 2019-10-17 MED ORDER — PALIPERIDONE ER 6 MG PO TB24
9.0000 mg | ORAL_TABLET | Freq: Every morning | ORAL | Status: DC
  Filled 2019-10-17: qty 1

## 2019-10-17 MED ORDER — DONEPEZIL HCL 5 MG PO TABS
10.0000 mg | ORAL_TABLET | Freq: Every day | ORAL | Status: DC
  Administered 2019-10-18 – 2019-10-19 (×2): 10 mg via ORAL
  Filled 2019-10-17 (×2): qty 2

## 2019-10-17 MED ORDER — DIVALPROEX SODIUM ER 250 MG PO TB24
250.0000 mg | ORAL_TABLET | Freq: Every morning | ORAL | Status: DC
  Administered 2019-10-18 – 2019-10-19 (×2): 250 mg via ORAL
  Filled 2019-10-17 (×3): qty 1

## 2019-10-17 MED ORDER — MAGNESIUM HYDROXIDE 400 MG/5ML PO SUSP: 30.0000 mL | Freq: Every day | ORAL | Status: DC | PRN

## 2019-10-17 MED ORDER — ALUM & MAG HYDROXIDE-SIMETH 200-200-20 MG/5ML PO SUSP: 30.0000 mL | ORAL | Status: DC | PRN

## 2019-10-17 NOTE — ED Notes (Signed)
Pt provided warm blanket upon request.

## 2019-10-17 NOTE — ED Notes (Signed)
Pt provided breakfast tray; able to feed self. 

## 2019-10-17 NOTE — BH Assessment (Signed)
Assessment Note  Justin Weeks is an 59 y.o. male. Justin Weeks arrived to the ED by way of law enforcement.  He reports "I am having suicidal thoughts".  He shared that he has been having suicidal thoughts for "a couple of weeks". He shared "I had no choice, I was talking to some people at [facility], his name was Dorene Sorrow, but the guy who came tonight was taking me through changes, my meds were going back and forth, which meant my mind was going back and forth.  He reports that he felt that the retirement home was taking his money. One thing leads to another.  He reports, "I'm depressed, I'm upset, I need counseling, and I need everyone to leave me alone.  Every one turns their back on me. He denied symptoms of anxiety.  Justin Weeks denied having auditory or visual hallucinations.  He denied homicidal ideation or intent.  He denied facing additional stressors.  He denied having homicidal ideation or intent.  He denied the use of alcohol or drugs.  He denied facing any additional stressors.    Diagnosis: Depression  Past Medical History:  Past Medical History:  Diagnosis Date  . Arthritis   . Asthma   . Blood transfusion without reported diagnosis   . Diabetes mellitus without complication (HCC)   . Hypertension   . Hypothyroid 07/03/2015  . Personal history of tobacco use, presenting hazards to health 05/31/2015  . Seizures (HCC)     No past surgical history on file.  Family History: No family history on file.  Social History:  reports that he has been smoking. He has a 40.00 pack-year smoking history. He does not have any smokeless tobacco history on file. He reports that he does not drink alcohol. No history on file for drug.  Additional Social History:  Alcohol / Drug Use History of alcohol / drug use?: No history of alcohol / drug abuse  CIWA: CIWA-Ar BP: (!) 142/97 Pulse Rate: 88 COWS:    Allergies:  Allergies  Allergen Reactions  . 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      Home Medications: (Not in a hospital admission)   OB/GYN Status:  No LMP for male patient.  General Assessment Data Location of Assessment: [facility] ED TTS Assessment: In system Is this a Tele or Face-to-Face Assessment?: Face-to-Face Is this an Initial Assessment or a Re-assessment for this encounter?: Initial Assessment Patient Accompanied by:: N/A Language Other than English: No Living Arrangements: (Independent living home) What gender do you identify as?: Male Marital status: Divorced Living Arrangements: Alone Can pt return to current living arrangement?: Yes Admission Status: Involuntary Petitioner: Other Is patient capable of signing voluntary admission?: No Referral Source: Self/Family/Friend Insurance type: Medicare, Medicaid  Medical Screening Exam [facility] Walk-in ONLY) Medical Exam completed: Yes  Crisis Care Plan Living Arrangements: Alone Legal Guardian: Other:(Self) Name of Psychiatrist: Dr. Mervyn Skeeters @ [address] Name of Therapist: Frederich Chick  Education Status Is patient currently in school?: No Is the patient employed, unemployed or receiving disability?: Receiving disability income  Risk to self with the past 6 months Suicidal Ideation: Yes-Currently Present Has patient been a risk to self within the past 6 months prior to admission? : No Suicidal Intent: No Has patient had any suicidal intent within the past 6 months prior to admission? : No Is patient at risk for suicide?: No Suicidal Plan?: Yes-Currently  Present Has patient had any suicidal plan within the past 6 months prior to admission? : No Specify Current Suicidal Plan: None reported Access to Means: No What has been your use of drugs/alcohol within the last 12 months?: Denied use Previous Attempts/Gestures: Yes How many times?: 1 Other Self Harm Risks: denied Triggers for Past Attempts:  Unknown Intentional Self Injurious Behavior: None Family Suicide History: No Recent stressful life event(s): (Denied stressors) Persecutory voices/beliefs?: No Depression: Yes Depression Symptoms: Despondent, Tearfulness Substance abuse history and/or treatment for substance abuse?: No Suicide prevention information given to non-admitted patients: Not applicable  Risk to Others within the past 6 months Homicidal Ideation: No Does patient have any lifetime risk of violence toward others beyond the six months prior to admission? : No Thoughts of Harm to Others: No Current Homicidal Intent: No-Not Currently/Within Last 6 Months Current Homicidal Plan: No-Not Currently/Within Last 6 Months Access to Homicidal Means: No Identified Victim: None identified History of harm to others?: No Assessment of Violence: None Noted Does patient have access to weapons?: No Criminal Charges Pending?: No Does patient have a court date: No Is patient on probation?: No  Psychosis Hallucinations: None noted Delusions: None noted  Mental Status Report Appearance/Hygiene: In scrubs Eye Contact: Fair Motor Activity: Unremarkable Speech: Tangential Level of Consciousness: Alert Mood: Irritable Affect: Depressed Anxiety Level: Minimal Thought Processes: Coherent Judgement: Partial Orientation: Appropriate for developmental age Obsessive Compulsive Thoughts/Behaviors: None  Cognitive Functioning Concentration: Normal Memory: Recent Intact Is patient IDD: No Insight: Poor Impulse Control: Fair Appetite: Good Have you had any weight changes? : No Change  ADLScreening [facility] Assessment Services) Patient's cognitive ability adequate to safely complete daily activities?: Yes Patient able to express need for assistance with ADLs?: Yes Independently performs ADLs?: Yes (appropriate for developmental age)  Prior Inpatient Therapy Prior Inpatient Therapy: Yes Prior Therapy Dates: Unable to  answer Prior Therapy Facilty/Provider(s): Unable to answer Reason for Treatment: Unable to answer  Prior Outpatient Therapy Prior Outpatient Therapy: Yes Prior Therapy Dates: Current Prior Therapy Facilty/Provider(s): [facility] Reason for Treatment: Unsure Does patient have an ACCT team?: Yes Does patient have Intensive In-House Services?  : No Does patient have [facility] services? : No Does patient have P4CC services?: No  ADL Screening (condition at time of admission) Patient's cognitive ability adequate to safely complete daily activities?: Yes Is the patient deaf or have difficulty hearing?: No Does the patient have difficulty seeing, even when wearing glasses/contacts?: No Does the patient have difficulty concentrating, remembering, or making decisions?: No Patient able to express need for assistance with ADLs?: Yes Does the patient have difficulty dressing or bathing?: No Independently performs ADLs?: Yes (appropriate for developmental age) Does the patient have difficulty walking or climbing stairs?: No Weakness of Legs: None Weakness of Arms/Hands: None  Home Assistive Devices/Equipment Home Assistive Devices/Equipment: None    Abuse/Neglect Assessment (Assessment to be complete while patient is alone) Abuse/Neglect Assessment Can Be Completed: (Denied history of abuse)     Advance Directives (For Healthcare) Does Patient Have a Medical Advance Directive?: No          Disposition:  Disposition Initial Assessment Completed for this Encounter: Yes  On Site Evaluation by:   Reviewed with Physician:    Elmer Bales 10/17/2019 2:48 AM

## 2019-10-17 NOTE — Tx Team (Signed)
Initial Treatment Plan 10/17/2019 6:36 PM [name] THF:[id number]    PATIENT STRESSORS: Financial difficulties Marital or family conflict Substance abuse   PATIENT STRENGTHS: General fund of knowledge Motivation for treatment/growth   PATIENT IDENTIFIED PROBLEMS: Passive SI  Substance abuse  Psychosis  Depression  Anxiety             DISCHARGE CRITERIA:  Ability to meet basic life and health needs Improved stabilization in mood, thinking, and/or behavior Need for constant or close observation no longer present Reduction of life-threatening or endangering symptoms to within safe limits  PRELIMINARY DISCHARGE PLAN: Outpatient therapy Placement in alternative living arrangements  PATIENT/FAMILY INVOLVEMENT: This treatment plan has been presented to and reviewed with the patient, Justin Weeks. The patient has been given the opportunity to ask questions and make suggestions.  Oluchi Pucci, RN 10/17/2019, 6:36 PM

## 2019-10-17 NOTE — ED Notes (Signed)
Hourly rounding reveals patient sleeping in hall bed. No complaints, stable, in no acute distress. Q15 minute rounds and monitoring via Rover and Officer to continue.  

## 2019-10-17 NOTE — ED Notes (Signed)
Psychiatry at bedside.

## 2019-10-17 NOTE — ED Notes (Signed)
Pt. Transferred from Triage to room 20 after dressing out and screening for contraband. Pt. Oriented to Quad including Q15 minute rounds as well as Rover and Officer for their protection. Patient is alert and oriented, warm and dry in no acute distress. Patient denies SI, HI, and AVH. Pt. Encouraged to let me know if needs arise.     

## 2019-10-17 NOTE — Progress Notes (Signed)
Admission Note:   Report was received from [place], [place] on a 59 year old male who presents IVC in no acute distress for the treatment of SI and Depression. Patient appears flat and depressed. Patient was calm and cooperative with admission process. Patient presents with passive SI and contracts for safety upon admission. Patient rated both depression/anxiety a "10/10", however, he did not go into detail as to why he feels this way. Patient also endorsed AVH, but he can not explain what it is. Patient denies HI and pain at this time. Patient stated that his stressors are that he is living with a woman, that's not his girlfriend. Patient has an extensive past medical history including DM without complication. His CBG was 126 upon admission. Skin was assessed with Aundra Millet, RN and found to be clear of any abnormal marks. Patient searched and no contraband found and unit policies explained and understanding verbalized. Consents obtained. Food and fluids offered, and both accepted. Patient had no additional questions or concerns to voice at this time.

## 2019-10-17 NOTE — ED Notes (Signed)
Patient's belongings placed in labeled belongings bag to be secured on the unit.  1 gray colored shirt, 1 white colored t-shirt, 1 pair blue jeans, 1 black colored shoes, white colored socks, black colored puffed coat, 1 brown colored hat, 1 black colored belt, 1 pair khaki colored pants, 1 pair blue colored briefs. 1 black colored zippered back containing patient's wallet and home medications.

## 2019-10-17 NOTE — ED Notes (Signed)
Hourly rounding reveals patient sleeping in room. No complaints, stable, in no acute distress. Q15 minute rounds and monitoring via Rover and Officer to continue.  

## 2019-10-17 NOTE — Plan of Care (Signed)
New admission.  Problem: Education: Goal: Knowledge of [facility] Education information/materials will improve Outcome: Not Progressing Goal: Emotional status will improve Outcome: Not Progressing Goal: Mental status will improve Outcome: Not Progressing Goal: Verbalization of understanding the information provided will improve Outcome: Not Progressing   Problem: Self-Concept: Goal: Ability to disclose and discuss suicidal ideas will improve Outcome: Not Progressing Goal: Will verbalize positive feelings about self Outcome: Not Progressing   Problem: Education: Goal: Will be free of psychotic symptoms Outcome: Not Progressing   Problem: Coping: Goal: Coping ability will improve Outcome: Not Progressing   Problem: Health Behavior/Discharge Planning: Goal: Compliance with prescribed medication regimen will improve Outcome: Not Progressing

## 2019-10-17 NOTE — ED Notes (Signed)
Hourly rounding reveals patient awake in room. No complaints, stable, in no acute distress. Q15 minute rounds and monitoring via Rover and Officer to continue.  

## 2019-10-17 NOTE — ED Notes (Signed)
Pt provide lunch tray; able to feed self.

## 2019-10-17 NOTE — ED Notes (Signed)
Patient talking to patient on camera.

## 2019-10-17 NOTE — ED Triage Notes (Signed)
Patient states he is having suicidal thoughts.  Patient contracts for safety with this RN.

## 2019-10-17 NOTE — ED Notes (Signed)
Pt ambulatory to bathroom independently
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
## 2019-10-17 NOTE — ED Provider Notes (Signed)
[facility] Emergency Department Provider Note  Time seen: 1:50 AM  I have reviewed the triage vital signs and the nursing notes.   HISTORY  Chief Complaint Psychiatric Evaluation   HPI Justin Weeks is a 59 y.o. male with a past medical history of diabetes, hypertension, presents emergency department for psychiatric evaluation.  According to the patient he has been experiencing depression and suicidal thoughts.  Patient denies any acute issue that might have provoked this.  However patient is very easily distracted and begins rambling on about knowing his multiplication table at 65 months old.  Unable to form a cohesive story at this time.  Patient denies any medical complaints today.  Largely negative review of systems.   Past Medical History:  Diagnosis Date  . Arthritis   . Asthma   . Blood transfusion without reported diagnosis   . Diabetes mellitus without complication (HCC)   . Hypertension   . Hypothyroid 07/03/2015  . Personal history of tobacco use, presenting hazards to health 05/31/2015  . Seizures [facility])     Patient Active Problem List   Diagnosis Date Noted  . Schizoaffective disorder, bipolar type (HCC) 07/04/2015  . HTN (hypertension) 07/04/2015  . Dyslipidemia 07/04/2015  . Tobacco use disorder 07/04/2015  . Noncompliance 07/03/2015  . Hypothyroidism 07/03/2015  . Diabetes (HCC) 04/20/2015    No past surgical history on file.  Prior to Admission medications   Medication Sig Start Date End Date Taking? Authorizing Provider  clonazePAM (KLONOPIN) 0.5 MG tablet Take 0.5 mg by mouth 2 (two) times daily.     [provider]  divalproex (DEPAKOTE) 500 MG DR tablet Take 1 tablet (500 mg total) by mouth 3 (three) times daily. 07/09/15   Pucilowska, Jolanta B, MD  glipiZIDE (GLUCOTROL XL) 10 MG 24 hr tablet Take 10 mg by mouth 2 (two) times daily.    [provider]  levothyroxine (SYNTHROID, LEVOTHROID) 50 MCG tablet Take 50  mcg by mouth daily.     [provider]  lisinopril (PRINIVIL,ZESTRIL) 5 MG tablet Take 1 tablet (5 mg total) by mouth daily. 07/09/15   Pucilowska, Jolanta B, MD  metFORMIN (GLUCOPHAGE-XR) 500 MG 24 hr tablet Take 1,000 mg by mouth 2 (two) times daily.     [provider]  naproxen (EC NAPROSYN) 500 MG EC tablet Take 1 tablet (500 mg total) by mouth 2 (two) times daily with a meal. 12/01/16   Menshew, Charlesetta Ivory, PA-C  paliperidone (INVEGA SUSTENNA) 234 MG/1.5ML SUSP injection Inject 234 mg into the muscle once. 07/25/15   Pucilowska, Braulio Conte B, MD  quinapril (ACCUPRIL) 5 MG tablet Take 5 mg by mouth daily.     [provider]  risperiDONE (RISPERDAL) 3 MG tablet Take 6 mg by mouth at bedtime.     [provider]  simvastatin (ZOCOR) 20 MG tablet Take 20 mg by mouth at bedtime.     [provider]  sitaGLIPtin (JANUVIA) 100 MG tablet Take 100 mg by mouth daily.    [provider]    Allergies  Allergen Reactions  . 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      No family history on file.  Social History Social History   Tobacco Use  . Smoking status: Current Every Day Smoker    Packs/day: 1.00    Years: 40.00    Pack years: 40.00  Substance  Use Topics  . Alcohol use: No  . Drug use: Not on file    Review of Systems Constitutional: Negative for fever. Cardiovascular: Negative for chest pain. Respiratory: Negative for shortness of breath. Gastrointestinal: Negative for abdominal pain Musculoskeletal: Negative for musculoskeletal complaints  Neurological: Negative for headache All other ROS negative  ____________________________________________   PHYSICAL EXAM:  VITAL SIGNS: ED Triage Vitals  Enc Vitals Group     BP 10/17/19 0129 (!) 142/97     Pulse Rate 10/17/19 0129 88     Resp 10/17/19 0129 18     Temp 10/17/19 0129  97.6 F (36.4 C)     Temp Source 10/17/19 0129 Oral     SpO2 10/17/19 0129 97 %     Weight 10/17/19 0127 160 lb (72.6 kg)     Height 10/17/19 0127 5\' 8"  (1.727 m)     Head Circumference --      Peak Flow --      Pain Score 10/17/19 0127 0     Pain Loc --      Pain Edu? --      Excl. in [place]? --    Constitutional: Alert and oriented. Well appearing and in no distress. Eyes: Normal exam ENT      Head: Normocephalic and atraumatic.      Mouth/Throat: Mucous membranes are moist. Cardiovascular: Normal rate, regular rhythm.  Respiratory: Normal respiratory effort without tachypnea nor retractions. Breath sounds are clear  Gastrointestinal: Soft and nontender. No distention.  Musculoskeletal: Nontender with normal range of motion in all extremities.  Neurologic:  Normal speech and language. No gross focal neurologic deficits  Skin:  Skin is warm, dry and intact.  Psychiatric: Tangential thought process  ____________________________________________   INITIAL IMPRESSION / ASSESSMENT AND PLAN / ED COURSE  Pertinent labs & imaging results that were available during my care of the patient were reviewed by me and considered in my medical decision making (see chart for details).   Patient presents emergency department for suicidal ideation and increased depression.  We will place under an IVC and have psychiatry evaluate.  Patient is very easily distracted, tangential thought process, difficult to stay focused.  Unclear the patient's baseline.  We will have psychiatry evaluate.  Denies any medical complaints largely negative review of systems we will check labs and closely monitor.  Patient's labs are largely negative besides cocaine positive urine drug screen.  Psychiatry has seen and evaluated and will reassess in the morning.  Justin Weeks was evaluated in Emergency Department on 10/17/2019 for the symptoms described in the history of present illness. He was evaluated in the context of the  global COVID-19 pandemic, which necessitated consideration that the patient might be at risk for infection with the SARS-CoV-2 virus that causes COVID-19. Institutional protocols and algorithms that pertain to the evaluation of patients at risk for COVID-19 are in a state of rapid change based on information released by regulatory bodies including the CDC and federal and state organizations. These policies and algorithms were followed during the patient's care in the ED.  ____________________________________________   FINAL CLINICAL IMPRESSION(S) / ED DIAGNOSES  Depression Suicidal ideation   Harvest Dark, MD 10/17/19 [id number]

## 2019-10-18 DIAGNOSIS — F250 Schizoaffective disorder, bipolar type: Secondary | ICD-10-CM

## 2019-10-18 LAB — HEPATIC FUNCTION PANEL
ALT: 9 U/L (ref 0–44)
AST: 17 U/L (ref 15–41)
Albumin: 3.2 g/dL — ABNORMAL LOW (ref 3.5–5.0)
Alkaline Phosphatase: 61 U/L (ref 38–126)
Bilirubin, Direct: <0.1 mg/dL (ref 0.0–0.2)
Total Bilirubin: 0.5 mg/dL (ref 0.3–1.2)
Total Protein: 7 g/dL (ref 6.5–8.1)

## 2019-10-18 LAB — VALPROIC ACID LEVEL: Valproic Acid Lvl: 72 ug/mL (ref 50.0–100.0)

## 2019-10-18 LAB — TSH: TSH: 2.032 u[IU]/mL (ref 0.350–4.500)

## 2019-10-18 MED ORDER — GLUCERNA SHAKE PO LIQD
237.0000 mL | Freq: Three times a day (TID) | ORAL | Status: DC
  Administered 2019-10-18 – 2019-10-19 (×3): 237 mL via ORAL

## 2019-10-18 MED ORDER — ADULT MULTIVITAMIN W/MINERALS CH
1.0000 | ORAL_TABLET | Freq: Every day | ORAL | Status: DC
  Administered 2019-10-19: 08:00:00 1 via ORAL
  Filled 2019-10-18: qty 1

## 2019-10-18 NOTE — Plan of Care (Signed)
Staff  continue to educate patient on [facility]  education. Information presented in concrete form for better understanding . Patient able to verbalize understanding  Emotional and mental status  improved . Thought process remained altered . Compliant  with medication  verbalize understanding    Problem: Health Behavior/Discharge Planning: Goal: Compliance with prescribed medication regimen will improve Outcome: Progressing   Problem: Coping: Goal: Coping ability will improve Outcome: Progressing   Problem: Education: Goal: Will be free of psychotic symptoms Outcome: Progressing   Problem: Self-Concept: Goal: Ability to disclose and discuss suicidal ideas will improve Outcome: Progressing   Problem: Education: Goal: Knowledge of [facility] Education information/materials will improve Outcome: Progressing Goal: Emotional status will improve Outcome: Progressing Goal: Mental status will improve Outcome: Progressing Goal: Verbalization of understanding the information provided will improve Outcome: Progressing

## 2019-10-18 NOTE — Progress Notes (Signed)
   10/18/19 1500  Clinical Encounter Type  Visited With Patient;Other (Comment)  Visit Type Initial;Spiritual support;Social support;Behavioral Health  Referral From Chaplain  Consult/Referral To Chaplain  Although patient did not say much during group on anxiety, at least he stay for the majority of the allotted time.

## 2019-10-18 NOTE — Progress Notes (Signed)
Recreation Therapy Notes  INPATIENT RECREATION THERAPY ASSESSMENT  Patient Details Name: LOIS OSTROM MRN: [id number] DOB: February 24, 1961 Today's Date: 10/18/2019       Information Obtained From: Patient  Able to Participate in Assessment/Interview: Yes  Patient Presentation: Responsive  Reason for Admission (Per Patient): Active Symptoms  Patient Stressors:    Coping Skills:   (N/A)  Leisure Interests (2+):  (N/A)  Frequency of Recreation/Participation:    Awareness of Community Resources:     [employer]:     Current Use:    If no, Barriers?:    Expressed Interest in [employer] Information:    [employer] of Residence:  Film/video editor  Patient Main Form of Transportation: Therapist, music  Patient Strengths:  N/A  Patient Identified Areas of Improvement:  N/A  Patient Goal for Hospitalization:  To get help with a part time job  Current SI (including self-harm):  No  Current HI:  No  Current AVH: No  Staff Intervention Plan: Group Attendance, Collaborate with Interdisciplinary Treatment Team  Consent to Intern Participation: N/A  Gloristine Turrubiates 10/18/2019, 3:30 PM

## 2019-10-18 NOTE — Progress Notes (Signed)
Patient alert and oriented x 4, affect is blunted thoughts are organized and coherent, speech is soft but tangential at times, appears restless and agitated when on the phone, but was later cal lm during wrap up group and appropriate with peers. Patient currently denies SI/HI/AVH no distress noted complaint with medication regimen. 15 minutes safety checks maintained will continue to monitor.

## 2019-10-18 NOTE — [facility] Group Notes (Signed)
[facility] Group Notes:  (Nursing/MHT/Case Management/Adjunct)  Date:  10/18/2019  Time:  8:57 AM  Type of Therapy:  Community Meeting  Participation Level:  Did Not Attend  Justin Weeks Justin Weeks 10/18/2019, 8:57 AM

## 2019-10-18 NOTE — Progress Notes (Signed)
Patient was pleasant and engaged. Alert and oriented x 4.  Observed interacting with peers and staff appropriately.  Denied SI/HI/AVH.  Patient compliant with medication.  No distress noted. 15 minute safety checks maintained during shift.

## 2019-10-18 NOTE — [facility] Group Notes (Signed)
LCSW Group Therapy Note  10/18/2019 1:00 PM  Type of Therapy/Topic:  Group Therapy:  Feelings about Diagnosis  Participation Level:  Did Not Attend   Description of Group:   This group will allow patients to explore their thoughts and feelings about diagnoses they have received. Patients will be guided to explore their level of understanding and acceptance of these diagnoses. Facilitator will encourage patients to process their thoughts and feelings about the reactions of others to their diagnosis and will guide patients in identifying ways to discuss their diagnosis with significant others in their lives. This group will be process-oriented, with patients participating in exploration of their own experiences, giving and receiving support, and processing challenge from other group members.   Therapeutic Goals: 1. Patient will demonstrate understanding of diagnosis as evidenced by identifying two or more symptoms of the disorder 2. Patient will be able to express two feelings regarding the diagnosis 3. Patient will demonstrate their ability to communicate their needs through discussion and/or role play  Summary of Patient Progress: X  Therapeutic Modalities:   Cognitive Behavioral Therapy Brief Therapy Feelings Identification   Adleigh Mcmasters, MSW, LCSW 10/18/2019 12:38 PM  

## 2019-10-18 NOTE — [facility] Group Notes (Signed)
[facility] Group Notes:  (Nursing/MHT/Case Management/Adjunct)  Date:  10/18/2019  Time:  11:31 AM  Type of Therapy:  Psychoeducational Skills  Participation Level:  Did Not Attend  Summary of Progress/Problems:  Kerrie Pleasure 10/18/2019, 11:31 AM

## 2019-10-18 NOTE — [facility] Suicide Risk Assessment (Signed)
[facility] INPATIENT:  Family/Significant Other Suicide Prevention Education  Suicide Prevention Education:  Patient Refusal for Family/Significant Other Suicide Prevention Education: The patient Justin Weeks has refused to provide written consent for family/significant other to be provided Family/Significant Other Suicide Prevention Education during admission and/or prior to discharge.  Physician notified.   SPE completed with pt, as pt refused to consent to family contact. SPI pamphlet provided to pt and pt was encouraged to share information with support network, ask questions, and talk about any concerns relating to SPE. Pt denies access to guns/firearms and verbalized understanding of information provided. Mobile Crisis information also provided to pt.    Charlann Lange Gianny Sabino MSW LCSW 10/18/2019, 10:03 AM

## 2019-10-18 NOTE — Plan of Care (Signed)
  Problem: Self-Concept: Goal: Ability to disclose and discuss suicidal ideas will improve Outcome: Progressing  Patient denied suicide ideation.  Noted interacting with peers.

## 2019-10-18 NOTE — Progress Notes (Signed)
D: Schizoaffective  A:  Patient noted loud and intrusive this am shift .  Argumentative  With staff regarding his medication . Stated he wanted them together . Writer accommodated him with  The 10 am medication.  Compliant  With medication given .Appetite good at meals . Isolates  To room . Patient refused to attend unit programing  This am shift .  Noted to pace up and down halls . Limited interaction with his peers . Voice to staff he would  Like to stay here. No ADL or personal chores preformed  This shift . Staff  continue to educate patient on [facility]  education. Information presented in concrete form for better understanding . Patient able to verbalize understanding  Emotional and mental status  improved . Thought process remained altered . Compliant  with medication  verbalize understanding . Encourage patient participation with unit programming . Instruction  Given on  Medication , verbalize understanding.  R: Voice no other concerns. Staff continue to monitor

## 2019-10-18 NOTE — B[facility] Counselor (Signed)
Adult Comprehensive Assessment  Patient ID: Justin Weeks, male   DOB: Dec 19, 1960, 59 y.o.   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  Are you sexually active?: No What is your sexual orientation?: heterosexual Has your sexual activity been affected by drugs, alcohol, medication, or emotional stress?: pt denies Does patient have children?: Yes How many children?: 1 How is patient's relationship with their children?: Pt has a son and reports "I don't see him that much"  Childhood History:  By whom was/is the patient raised?: Both parents Description of patient's relationship with caregiver when they were a child: "Wonderful! We had a good time."  Patient's description of current relationship with people who raised him/her: Pts mother is deceased, pts reports a "fine" relationship with his father Does patient have  siblings?: Yes Number of Siblings: 4 Description of patient's current relationship with siblings: Pt reports he has a good relationship with his siblings Did patient suffer any verbal/emotional/physical/sexual abuse as a child?: No Did patient suffer from severe childhood neglect?: No Has patient ever been sexually abused/assaulted/raped as an adolescent or adult?: No Was the patient ever a victim of a crime or a disaster?: No Witnessed domestic violence?: No Has patient been effected by domestic violence as an adult?: No  Education:  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  Patient's job has been impacted by current illness: No What is the longest time patient has a held a job?: 4 years Where was the patient employed at that time?: [employer]  Did You Receive Any Psychiatric Treatment/Services While in the [employer]?: No Are There Guns or Other Weapons in [place]?: No Are These Psychologist, educational?: (N/A)  Financial Resources:   Financial resources: [employer], Receives SSI, Medicare, Medicaid, Food stamps Does patient have a representative payee or guardian?: Yes Name of representative payee or guardian: Pt reports having a payee named Justin Weeks. Pt unsure of Payee last name or what agency Justin Weeks works for  Alcohol/Substance Abuse:   What has been your use of drugs/alcohol within the last 12 months?: pt denies If attempted suicide, did drugs/alcohol play a role in this?: No Alcohol/Substance Abuse Treatment Hx: Denies past history Has alcohol/substance abuse ever caused legal problems?: No  Social Support System:   Patient's Community Support System: Good Describe Community Support System: "Materials engineer" Type of faith/religion: Catholic  Leisure/Recreation:   Leisure and Hobbies:  "You wouldn't know that"  Strengths/Needs:   What is the patient's perception of their strengths?: "You wouldn't know that either" Patient states these barriers may affect/interfere with their treatment: none reported Patient states these barriers may affect their return to the community: none reported Other important information patient would like considered in planning for their treatment: Pt wants to continue with his ACT team  Discharge Plan:   Currently receiving community mental health services: Yes (From Whom)([facility]) Patient states concerns and preferences for aftercare planning are: Pt will continue with his current ACT services Patient states they will know when they are safe and ready for discharge when: "I don't think no more" Does patient have access to transportation?: No Does patient have financial barriers related to discharge medications?: No Will patient be returning to same living situation after discharge?: Yes  Summary/Recommendations:   Summary and Recommendations (to be completed by the evaluator): Pt is a 59 yo [place] [facility]. Pt presents to the hospital seeking treatment for SI, depression, and medication stabilization. Pt has a diagnosis of schizoaffective disorder. Pt is divorced, unempoyed on disability, has medicare and [employer], reports a good support system, and denies history of abuse/trauma. Pt denies SI/HI/AVH currently. Pt is agreeable to continue services with [facility]. Recommendations for pt include: crisis stabilization, therapeutic milieu, encourage group attendance and participation, medication management for mood stabilization, and development for comprehensive mental wellness plan. CSW assessing for appropriate referrals.  Justin Weeks Justin Weeks MSW LCSW 10/18/2019 10:02 AM

## 2019-10-18 NOTE — Progress Notes (Signed)
Initial Nutrition Assessment  DOCUMENTATION CODES:   Not applicable  INTERVENTION:   Glucerna Shake po TID, each supplement provides 220 kcal and 10 grams of protein  MVI daily   NUTRITION DIAGNOSIS:   Predicted suboptimal nutrient intake related to social / environmental circumstances(depression, substance abuse) as evidenced by other (comment)(per chart review).  GOAL:   Patient will meet greater than or equal to 90% of their needs  MONITOR:   PO intake, Supplement acceptance  REASON FOR ASSESSMENT:   Consult Assessment of nutrition requirement/status  ASSESSMENT:   59 y/o male with a past medical history of diabetes, hypertension, presents emergency department for psychiatric evaluation.   Pt with good appetite and oral intake in hospital; pt eating 100% of meals. Suspect pt with decreased appetite and oral intake pta at times when he is depressed or abusing substances. RD will add supplements and vitamins to help pt meet his estimated. Per chart, pt appears fairly weight stable at baseline.   Medications reviewed and include: glipizide, synthroid, metformin  Labs reviewed:   Diet Order:   Diet Order            Diet Carb Modified Fluid consistency: Thin; Room service appropriate? Yes  Diet effective now             EDUCATION NEEDS:   No education needs have been identified at this time  Skin:  Skin Assessment: Reviewed RN Assessment  Last BM:  unknown  Height:   Ht Readings from Last 1 Encounters:  10/17/19 5\' 8"  (1.727 m)    Weight:   Wt Readings from Last 1 Encounters:  10/17/19 69.4 kg    Ideal Body Weight:  70 kg  BMI:  Body mass index is 23.26 kg/m.  Estimated Nutritional Needs:   Kcal:  1800-2100kcal/day  Protein:  90-105g/day  Fluid:  >2.1L/day  10/19/19 MS, RD, LDN Contact information available in Amion
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
## 2019-10-18 NOTE — Progress Notes (Signed)
CSW spoke with Liborio Nixon from pts ACT team. Liborio Nixon reported there is nothing specifically she needs [facility] to do while pt is in the hospital. Liborio Nixon reported that pt lives in an apartment through a [facility] program. Liborio Nixon asked for CSW team to let her know when pt is scheduled to be discharged.   Iris Pert, MSW, LCSW Clinical Social Work 10/18/2019 10:08 AM

## 2019-10-18 NOTE — Progress Notes (Signed)
Recreation Therapy Notes   Date: 10/18/2019  Time: 9:30 am   Location: Craft room   Behavioral response: N/A   Intervention Topic: Stress    Discussion/Intervention: Patient did not attend group.   Clinical Observations/Feedback:  Patient did not attend group.   Karisha Marlin LRT/CTRS        Chase Knebel 10/18/2019 10:39 AM 

## 2019-10-18 NOTE — Tx Team (Addendum)
Interdisciplinary Treatment and Diagnostic Plan Update  10/18/2019 Time of Session: 900am Justin Weeks MRN: [id number]  Principal Diagnosis: <principal problem not specified>  Secondary Diagnoses: Active Problems:   Schizoaffective disorder (HCC)   Current Medications:  Current Facility-Administered Medications  Medication Dose Route Frequency Provider Last Rate Last Admin  . acetaminophen (TYLENOL) tablet 650 mg  650 mg Oral Q6H PRN Cristofano, Worthy Rancher, MD      . alum & mag hydroxide-simeth (MAALOX/MYLANTA) 200-200-20 MG/5ML suspension 30 mL  30 mL Oral Q4H PRN Cristofano, Worthy Rancher, MD      . clonazePAM (KLONOPIN) tablet 0.5 mg  0.5 mg Oral BID Cristofano, Worthy Rancher, MD   0.5 mg at 10/18/19 0818  . divalproex (DEPAKOTE ER) 24 hr tablet 250 mg  250 mg Oral q morning - 10a Cristofano, Worthy Rancher, MD   250 mg at 10/18/19 0818  . divalproex (DEPAKOTE ER) 24 hr tablet 750 mg  750 mg Oral QHS Cristofano, Worthy Rancher, MD   750 mg at 10/17/19 2218  . donepezil (ARICEPT) tablet 10 mg  10 mg Oral Daily Cristofano, Worthy Rancher, MD   10 mg at 10/18/19 0818  . glipiZIDE (GLUCOTROL XL) 24 hr tablet 10 mg  10 mg Oral BID Cristofano, Worthy Rancher, MD   10 mg at 10/18/19 0818  . levothyroxine (SYNTHROID) tablet 50 mcg  50 mcg Oral Q0600 Clement Sayres, MD   50 mcg at 10/18/19 0649  . linagliptin (TRADJENTA) tablet 5 mg  5 mg Oral Daily Cristofano, Worthy Rancher, MD   5 mg at 10/18/19 0818  . magnesium hydroxide (MILK OF MAGNESIA) suspension 30 mL  30 mL Oral Daily PRN Cristofano, Paul A, MD      . metFORMIN (GLUCOPHAGE-XR) 24 hr tablet 1,000 mg  1,000 mg Oral BID WC Cristofano, Worthy Rancher, MD   1,000 mg at 10/18/19 0818  . paliperidone (INVEGA) 24 hr tablet 9 mg  9 mg Oral q morning - 10a Cristofano, Paul A, MD      . simvastatin (ZOCOR) tablet 20 mg  20 mg Oral QHS Cristofano, Worthy Rancher, MD   20 mg at 10/17/19 2219   PTA Medications: Medications Prior to Admission  Medication Sig Dispense Refill Last Dose  . clonazePAM (KLONOPIN)  0.5 MG tablet Take 0.5 mg by mouth 2 (two) times daily.      . divalproex (DEPAKOTE ER) 250 MG 24 hr tablet Take 250-750 mg by mouth 2 (two) times daily. Take 1 tablet by mouth in the morning and 3 tablets by mouth at bedtime     . donepezil (ARICEPT) 10 MG tablet Take 10 mg by mouth daily.     Marland Kitchen glipiZIDE (GLUCOTROL XL) 10 MG 24 hr tablet Take 10 mg by mouth 2 (two) times daily.     . INVEGA 9 MG 24 hr tablet Take 9 mg by mouth every morning.     Marland Kitchen levothyroxine (SYNTHROID, LEVOTHROID) 50 MCG tablet Take 50 mcg by mouth daily.      . metFORMIN (GLUCOPHAGE-XR) 500 MG 24 hr tablet Take 1,000 mg by mouth 2 (two) times daily.      . quinapril (ACCUPRIL) 5 MG tablet Take 5 mg by mouth daily.      . risperidone (RISPERDAL) 4 MG tablet Take 4 mg by mouth at bedtime.      . simvastatin (ZOCOR) 20 MG tablet Take 20 mg by mouth at bedtime.      . sitaGLIPtin (JANUVIA) 100 MG tablet Take  100 mg by mouth daily.     . trihexyphenidyl (ARTANE) 5 MG tablet Take 5 mg by mouth daily.     . VENTOLIN HFA 108 (90 Base) MCG/ACT inhaler Inhale 2 puffs into the lungs every 4 (four) hours as needed.       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   Physician Treatment Plan for Primary Diagnosis: <principal problem not specified> Long Term Goal(s):     Short Term Goals:    Medication Management: Evaluate patient's response, side effects, and tolerance of medication regimen.  Therapeutic Interventions: 1 to 1 sessions, Unit Group sessions and Medication administration.  Evaluation of Outcomes: Progressing  Physician Treatment Plan for Secondary Diagnosis: Active Problems:   Schizoaffective disorder (HCC)  Long Term Goal(s):     Short  Term Goals:       Medication Management: Evaluate patient's response, side effects, and tolerance of medication regimen.  Therapeutic Interventions: 1 to 1 sessions, Unit Group sessions and Medication administration.  Evaluation of Outcomes: Progressing   RN Treatment Plan for Primary Diagnosis: <principal problem not specified> Long Term Goal(s): Knowledge of disease and therapeutic regimen to maintain health will improve  Short Term Goals: Ability to demonstrate self-control, Ability to participate in decision making will improve and Ability to verbalize feelings will improve  Medication Management: RN will administer medications as ordered by provider, will assess and evaluate patient's response and provide education to patient for prescribed medication. RN will report any adverse and/or side effects to prescribing provider.  Therapeutic Interventions: 1 on 1 counseling sessions, Psychoeducation, Medication administration, Evaluate responses to treatment, Monitor vital signs and CBGs as ordered, Perform/monitor CIWA, COWS, AIMS and Fall Risk screenings as ordered, Perform wound care treatments as ordered.  Evaluation of Outcomes: Progressing   LCSW Treatment Plan for Primary Diagnosis: <principal problem not specified> Long Term Goal(s): Safe transition to appropriate next level of care at discharge, Engage patient in therapeutic group addressing interpersonal concerns.  Short Term Goals: Engage patient in aftercare planning with referrals and resources and Increase skills for wellness and recovery  Therapeutic Interventions: Assess for all discharge needs, 1 to 1 time with Social worker, Explore available resources and support systems, Assess for adequacy in community support network, Educate family and significant other(s) on suicide prevention, Complete Psychosocial Assessment, Interpersonal group therapy.  Evaluation of Outcomes: Progressing   Progress in Treatment: Attending  groups: No. Participating in groups: No. Taking medication as prescribed: Yes. Toleration medication: Yes. Family/Significant other contact made: No, will contact:  pt declined collateral contact Patient understands diagnosis: No. Discussing patient identified problems/goals with staff: Yes. Medical problems stabilized or resolved: Yes. Denies suicidal/homicidal ideation: Yes. Issues/concerns per patient self-inventory: No. Other: N/A  New problem(s) identified: No, Describe:  none  New Short Term/Long Term Goal(s): Detox, elimination of AVH/symptoms of psychosis, medication management for mood stabilization; elimination of SI thoughts; development of comprehensive mental wellness/sobriety plan.   Patient Goals:  "To get a parttime job"  Discharge Plan or Barriers: SPE pamphlet, Mobile Crisis information, and AA/NA information provided to patient for additional community support and resources. Pt will continue to follow up with [facility]  Reason for Continuation of Hospitalization: Depression Medication stabilization  Estimated Length of Stay: 2-3 days  Recreational Therapy: Patient Stressors: N/A Patient Goal: Patient will engage in  groups without prompting or encouragement from LRT x3 group sessions within 5 recreation therapy group sessions  Attendees: Patient: Justin Weeks 10/18/2019 10:04 AM  Physician: Dr Weber Cooks MD 10/18/2019 10:04 AM  Nursing: West Pugh RN 10/18/2019 10:04 AM  RN Care Manager: 10/18/2019 10:04 AM  Social Worker: Minette Brine Moton LCSW 10/18/2019 10:04 AM  Recreational Therapist: Roanna Epley CTRS LRT 10/18/2019 10:04 AM  Other: Sanjuana Kava LCSW  10/18/2019 10:04 AM  Other: Assunta Curtis LCSW 10/18/2019 10:04 AM  Other: 10/18/2019 10:04 AM    Scribe for Treatment Team: Mariann Laster Moton, LCSW 10/18/2019 10:04 AM

## 2019-10-18 NOTE — H&P (Signed)
Psychiatric Admission Assessment Adult  Patient Identification: Justin Weeks MRN:  [id number] Date of Evaluation:  10/18/2019 Chief Complaint:  Schizoaffective disorder (HCC) [F25.9] Principal Diagnosis: Schizoaffective disorder (HCC) Diagnosis:  Principal Problem:   Schizoaffective disorder (HCC) Active Problems:   Diabetes (HCC)   Hypothyroidism   HTN (hypertension)  History of Present Illness: Patient seen chart reviewed.  59 year old man with a history of schizoaffective disorder who came to the emergency room voluntarily.  He is documented as having said that he was having suicidal thoughts at that time.  He was reassessed in the morning after coming in and was still voicing suicidal ideation.  Patient had not evidently made any actual attempt to harm himself and did not specify any means that he was intending to use.  On interview today the patient says he is feeling much better.  He says his mood now is fine.  He denies any suicidal thoughts at all.  Tells me that he "loves life".  Denies homicidal ideation.  Denies any hallucinations currently.  He says that he has been compliant with his medicine.  He tells me that there is been some stresses around people coming over and bumming cigarettes from him and maybe not treating him as respectfully as he would like.  I noted that his drug screen on admission was positive for cocaine.  Questioned him about that and he was a little evasive but did not deny having used a little cocaine recently.  He says at this point however he feels like he is back to his baseline. Associated Signs/Symptoms: Depression Symptoms:  depressed mood, insomnia, difficulty concentrating, (Hypo) Manic Symptoms:  Distractibility, Anxiety Symptoms:  Excessive Worry, Psychotic Symptoms:  History of psychotic symptoms although currently denying any active ones PTSD Symptoms: Negative Total Time spent with patient: 1 hour  Past Psychiatric History: Patient has a long  history of chronic mental illness.  He is followed by the [facility] [employer] act team.  He lives in an independent living unit.  He says he does not remember the names of the medicines he is on but that he has been compliant with everything that he is prescribed.  The Depakote level done on admission would suggest that he has been compliant.  He has no past history of suicide attempts or violence.  It has been several years since his last hospitalization.  Is the patient at risk to self? No.  Has the patient been a risk to self in the past 6 months? Yes.    Has the patient been a risk to self within the distant past? No.  Is the patient a risk to others? No.  Has the patient been a risk to others in the past 6 months? No.  Has the patient been a risk to others within the distant past? No.   Prior Inpatient Therapy:   Prior Outpatient Therapy:    Alcohol Screening: Patient refused Alcohol Screening Tool: Yes 1. How often do you have a drink containing alcohol?: Never 2. How many drinks containing alcohol do you have on a typical day when you are drinking?: 1 or 2 3. How often do you have six or more drinks on one occasion?: Never AUDIT-C Score: 0 4. How often during the last year have you found that you were not able to stop drinking once you had started?: Never 5. How often during the last year have you failed to do what was normally expected from you becasue of drinking?: Never 6. How often  during the last year have you needed a first drink in the morning to get yourself going after a heavy drinking session?: Never 7. How often during the last year have you had a feeling of guilt of remorse after drinking?: Never 8. How often during the last year have you been unable to remember what happened the night before because you had been drinking?: Never 9. Have you or someone else been injured as a result of your drinking?: No 10. Has a relative or friend or a doctor or another health worker been  concerned about your drinking or suggested you cut down?: No Alcohol Use Disorder Identification Test Final Score (AUDIT): 0 Alcohol Brief Interventions/Follow-up: Patient Refused Substance Abuse History in the last 12 months:  Yes.   Consequences of Substance Abuse: Medical Consequences:  My best guess would be that of brief flirtation with cocaine had something to do with this transient decompensation. Previous Psychotropic Medications: Yes  Psychological Evaluations: Yes  Past Medical History:  Past Medical History:  Diagnosis Date  . Arthritis   . Asthma   . Blood transfusion without reported diagnosis   . Diabetes mellitus without complication (HCC)   . Hypertension   . Hypothyroid 07/03/2015  . Personal history of tobacco use, presenting hazards to health 05/31/2015  . Seizures (HCC)    History reviewed. No pertinent surgical history. Family History: History reviewed. No pertinent family history. Family Psychiatric  History: Denies knowing of any Tobacco Screening:   Social History:  Social History   Substance and Sexual Activity  Alcohol Use No     Social History   Substance and Sexual Activity  Drug Use Yes  . Types: Cocaine    Additional Social History: Marital status: Divorced Divorced, when?: 1983 What types of issues is patient dealing with in the relationship?: She cheated on him.  Are you sexually active?: No What is your sexual orientation?: heterosexual Has your sexual activity been affected by drugs, alcohol, medication, or emotional stress?: pt denies Does patient have children?: Yes How many children?: 1 How is patient's relationship with their children?: Pt has a son and reports "I don't see him that much"                         Allergies:   Allergies  Allergen Reactions  . 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     Lab Results:  Results for  orders placed or performed during the hospital encounter of 10/17/19 (from the past 48 hour(s))  Glucose, capillary     Status: Abnormal   Collection Time: 10/17/19  5:20 PM  Result Value Ref Range   Glucose-Capillary 126 (H) 70 - 99 mg/dL  Hepatic function panel     Status: Abnormal   Collection Time: 10/18/19  7:21 AM  Result Value Ref Range   Total Protein 7.0 6.5 - 8.1 g/dL   Albumin 3.2 (L) 3.5 - 5.0 g/dL   AST 17 15 - 41 U/L   ALT 9 0 - 44 U/L   Alkaline Phosphatase 61 38 - 126 U/L   Total Bilirubin 0.5 0.3 - 1.2 mg/dL   Bilirubin, Direct <3.5 0.0 - 0.2 mg/dL   Indirect Bilirubin NOT CALCULATED 0.3 - 0.9 mg/dL    Comment: Performed at [facility], [address], [place], [place] [address]  TSH     Status: None  Collection Time: 10/18/19  7:21 AM  Result Value Ref Range   TSH 2.032 0.350 - 4.500 uIU/mL    Comment: Performed by a 3rd Generation assay with a functional sensitivity of <=0.01 uIU/mL. Performed at [facility], [address] [place], [place] [address]   Valproic acid level     Status: None   Collection Time: 10/18/19  7:21 AM  Result Value Ref Range   Valproic Acid Lvl 72 50.0 - 100.0 ug/mL    Comment: Performed at [facility], [address] [place], [place] [address]    Blood Alcohol level:  Lab Results  Component Value Date   [facility] <10 10/17/2019   ETH <5 07/03/2015    Metabolic Disorder Labs:  Lab Results  Component Value Date   HGBA1C 6.5 (H) 07/04/2015   No results found for: PROLACTIN Lab Results  Component Value Date   CHOL 113 07/04/2015   TRIG 118 07/04/2015   HDL 30 (L) 07/04/2015   CHOLHDL 3.8 07/04/2015   VLDL 24 07/04/2015   LDLCALC 59 07/04/2015   LDLCALC 41 09/21/2014    Current Medications: Current Facility-Administered Medications  Medication Dose Route Frequency Provider Last Rate Last Admin  . acetaminophen (TYLENOL) tablet 650 mg  650 mg Oral Q6H PRN Cristofano, Worthy Rancher, MD      . alum &  mag hydroxide-simeth (MAALOX/MYLANTA) 200-200-20 MG/5ML suspension 30 mL  30 mL Oral Q4H PRN Cristofano, Worthy Rancher, MD      . clonazePAM (KLONOPIN) tablet 0.5 mg  0.5 mg Oral BID Cristofano, Worthy Rancher, MD   0.5 mg at 10/18/19 0818  . divalproex (DEPAKOTE ER) 24 hr tablet 250 mg  250 mg Oral q morning - 10a Cristofano, Worthy Rancher, MD   250 mg at 10/18/19 0818  . divalproex (DEPAKOTE ER) 24 hr tablet 750 mg  750 mg Oral QHS Cristofano, Worthy Rancher, MD   750 mg at 10/17/19 2218  . donepezil (ARICEPT) tablet 10 mg  10 mg Oral Daily Cristofano, Worthy Rancher, MD   10 mg at 10/18/19 0818  . glipiZIDE (GLUCOTROL XL) 24 hr tablet 10 mg  10 mg Oral BID Cristofano, Worthy Rancher, MD   10 mg at 10/18/19 0818  . levothyroxine (SYNTHROID) tablet 50 mcg  50 mcg Oral Q0600 Clement Sayres, MD   50 mcg at 10/18/19 0649  . linagliptin (TRADJENTA) tablet 5 mg  5 mg Oral Daily Cristofano, Worthy Rancher, MD   5 mg at 10/18/19 0818  . magnesium hydroxide (MILK OF MAGNESIA) suspension 30 mL  30 mL Oral Daily PRN Cristofano, Paul A, MD      . metFORMIN (GLUCOPHAGE-XR) 24 hr tablet 1,000 mg  1,000 mg Oral BID WC Cristofano, Worthy Rancher, MD   1,000 mg at 10/18/19 0818  . paliperidone (INVEGA) 24 hr tablet 9 mg  9 mg Oral q morning - 10a Cristofano, Paul A, MD      . simvastatin (ZOCOR) tablet 20 mg  20 mg Oral QHS Cristofano, Worthy Rancher, MD   20 mg at 10/17/19 2219   PTA Medications: Medications Prior to Admission  Medication Sig Dispense Refill Last Dose  . clonazePAM (KLONOPIN) 0.5 MG tablet Take 0.5 mg by mouth 2 (two) times daily.      . divalproex (DEPAKOTE ER) 250 MG 24 hr tablet Take 250-750 mg by mouth 2 (two) times daily. Take 1 tablet by mouth in the morning and 3 tablets by mouth at bedtime     .  donepezil (ARICEPT) 10 MG tablet Take 10 mg by mouth daily.     Marland Kitchen glipiZIDE (GLUCOTROL XL) 10 MG 24 hr tablet Take 10 mg by mouth 2 (two) times daily.     . INVEGA 9 MG 24 hr tablet Take 9 mg by mouth every morning.     Marland Kitchen levothyroxine (SYNTHROID,  LEVOTHROID) 50 MCG tablet Take 50 mcg by mouth daily.      . metFORMIN (GLUCOPHAGE-XR) 500 MG 24 hr tablet Take 1,000 mg by mouth 2 (two) times daily.      . quinapril (ACCUPRIL) 5 MG tablet Take 5 mg by mouth daily.      . risperidone (RISPERDAL) 4 MG tablet Take 4 mg by mouth at bedtime.      . simvastatin (ZOCOR) 20 MG tablet Take 20 mg by mouth at bedtime.      . sitaGLIPtin (JANUVIA) 100 MG tablet Take 100 mg by mouth daily.     . trihexyphenidyl (ARTANE) 5 MG tablet Take 5 mg by mouth daily.     . VENTOLIN HFA 108 (90 Base) MCG/ACT inhaler Inhale 2 puffs into the lungs every 4 (four) hours as needed.       Musculoskeletal: Strength & Muscle Tone: within normal limits Gait & Station: normal Patient leans: N/A  Psychiatric Specialty Exam: Physical Exam  Nursing note and vitals reviewed. Constitutional: He appears well-developed and well-nourished.  HENT:  Head: Normocephalic and atraumatic.  Eyes: Pupils are equal, round, and reactive to light. Conjunctivae are normal.  Cardiovascular: Regular rhythm and normal heart sounds.  Respiratory: Effort normal.  GI: Soft.  Musculoskeletal:        General: Normal range of motion.     Cervical back: Normal range of motion.  Neurological: He is alert.  Skin: Skin is warm and dry.  Psychiatric: His affect is blunt. His speech is delayed. He is slowed. Cognition and memory are impaired. He expresses impulsivity. He expresses no homicidal and no suicidal ideation.    Review of Systems  Constitutional: Negative.   HENT: Negative.   Eyes: Negative.   Respiratory: Negative.   Cardiovascular: Negative.   Gastrointestinal: Negative.   Musculoskeletal: Negative.   Skin: Negative.   Neurological: Negative.   Psychiatric/Behavioral: Negative.     Blood pressure (!) 149/92, pulse 76, temperature 98.1 F (36.7 C), temperature source Oral, resp. rate 18, height 5\' 8"  (1.727 m), weight 69.4 kg, SpO2 100 %.Body mass index is 23.26 kg/m.   General Appearance: Casual  Eye Contact:  Fair  Speech:  Slow  Volume:  Decreased  Mood:  Euthymic  Affect:  Blunt  Thought Process:  Goal Directed  Orientation:  Full (Time, Place, and Person)  Thought Content:  Logical, Rumination and Tangential  Suicidal Thoughts:  No  Homicidal Thoughts:  No  Memory:  Immediate;   Fair Recent;   Fair Remote;   Fair  Judgement:  Fair  Insight:  Fair  Psychomotor Activity:  Normal  Concentration:  Concentration: Fair  Recall:  [employer] of Knowledge:  Fair  Language:  Fair  Akathisia:  No  Handed:  Right  AIMS (if indicated):     Assets:  Desire for Improvement Housing Resilience Social Support  ADL's:  Intact  Cognition:  Impaired,  Mild  Sleep:  Number of Hours: 6    Treatment Plan Summary: Daily contact with patient to assess and evaluate symptoms and progress in treatment, Medication management and Plan Patient with schizoaffective disorder who seems to be  bouncing back to his baseline.  He is currently calm taking care of his basic ADLs and appropriate in his interactions.  Denies any suicidal thoughts at all.  He is completely agreeable to continuing his outpatient medicine.  Physically he seems to be doing well his blood sugars run in the mid 100s which he says is typical for him.  Patient will likely only need to be in the hospital 1 to 2 days.  Continue current medicines.  Continue 15-minute checks.  Likely discharge tomorrow.  I have made an attempt to be in contact with his act team to let them know as well.  Observation Level/Precautions:  15 minute checks  Laboratory:  UDS  Psychotherapy:    Medications:    Consultations:    Discharge Concerns:    Estimated LOS:  Other:     Physician Treatment Plan for Primary Diagnosis: Schizoaffective disorder (HCC) Long Term Goal(s): Improvement in symptoms so as ready for discharge  Short Term Goals: Ability to verbalize feelings will improve, Ability to disclose and discuss  suicidal ideas and Ability to demonstrate self-control will improve  Physician Treatment Plan for Secondary Diagnosis: Principal Problem:   Schizoaffective disorder (HCC) Active Problems:   Diabetes (HCC)   Hypothyroidism   HTN (hypertension)  Long Term Goal(s): Improvement in symptoms so as ready for discharge  Short Term Goals: Compliance with prescribed medications will improve  I certify that inpatient services furnished can reasonably be expected to improve the patient's condition.    Mordecai Rasmussen, MD 2/16/202110:43 AM

## 2019-10-19 LAB — PROLACTIN: Prolactin: 21.9 ng/mL — ABNORMAL HIGH (ref 4.0–15.2)

## 2019-10-19 MED ORDER — INVEGA 9 MG PO TB24: 9.0000 mg | ORAL_TABLET | Freq: Every morning | ORAL | 1 refills | Status: DC

## 2019-10-19 MED ORDER — DIVALPROEX SODIUM ER 250 MG PO TB24: 750.0000 mg | ORAL_TABLET | Freq: Every day | ORAL | 1 refills | Status: DC

## 2019-10-19 MED ORDER — METFORMIN HCL ER (OSM) 1000 MG PO TB24: 1000.0000 mg | ORAL_TABLET | Freq: Two times a day (BID) | ORAL | 1 refills | Status: DC

## 2019-10-19 MED ORDER — DIVALPROEX SODIUM ER 250 MG PO TB24: 250.0000 mg | ORAL_TABLET | Freq: Every morning | ORAL | 1 refills | Status: DC

## 2019-10-19 NOTE — Plan of Care (Signed)
Pt denies depression, anxiety, SI, HI and AVH. Pt was educated on care plan and verbalizes understanding. Torrie Mayers RN Problem: Education: Goal: Knowledge of [facility] Education information/materials will improve Outcome: Adequate for Discharge Goal: Emotional status will improve Outcome: Adequate for Discharge Goal: Mental status will improve Outcome: Adequate for Discharge Goal: Verbalization of understanding the information provided will improve Outcome: Adequate for Discharge   Problem: Self-Concept: Goal: Ability to disclose and discuss suicidal ideas will improve Outcome: Adequate for Discharge Goal: Will verbalize positive feelings about self Outcome: Adequate for Discharge   Problem: Education: Goal: Will be free of psychotic symptoms Outcome: Adequate for Discharge   Problem: Coping: Goal: Coping ability will improve Outcome: Adequate for Discharge   Problem: Health Behavior/Discharge Planning: Goal: Compliance with prescribed medication regimen will improve Outcome: Adequate for Discharge

## 2019-10-19 NOTE — Discharge Summary (Signed)
Physician Discharge Summary Note  Patient:  Justin Weeks is an 59 y.o., male MRN:  [id number] DOB:  May 19, 1961 Patient phone:  [phone] (home)  Patient address:   [address] [place] [address],  Total Time spent with patient: 30 minutes  Date of Admission:  10/17/2019 Date of Discharge: 10/19/19  Reason for Admission:  59 year old man with a history of schizoaffective disorder who came to the emergency room voluntarily.  He is documented as having said that he was having suicidal thoughts at that time.  He was reassessed in the morning after coming in and was still voicing suicidal ideation.  Patient had not evidently made any actual attempt to harm himself and did not specify any means that he was intending to use.  Principal Problem: Schizoaffective disorder [facility]) Discharge Diagnoses: Principal Problem:   Schizoaffective disorder (HCC) Active Problems:   Diabetes (HCC)   Hypothyroidism   HTN (hypertension)   Past Psychiatric History: Patient has a long history of chronic mental illness.  He is followed by the [employer] act team.  He lives in an independent living unit.  He says he does not remember the names of the medicines he is on but that he has been compliant with everything that he is prescribed.  The Depakote level done on admission would suggest that he has been compliant.  He has no past history of suicide attempts or violence.  It has been several years since his last hospitalization.  Past Medical History:  Past Medical History:  Diagnosis Date  . Arthritis   . Asthma   . Blood transfusion without reported diagnosis   . Diabetes mellitus without complication (HCC)   . Hypertension   . Hypothyroid 07/03/2015  . Personal history of tobacco use, presenting hazards to health 05/31/2015  . Seizures (HCC)    History reviewed. No pertinent surgical history. Family History: History reviewed. No pertinent family history. Family Psychiatric  History: None  reported Social History:  Social History   Substance and Sexual Activity  Alcohol Use No     Social History   Substance and Sexual Activity  Drug Use Yes  . Types: Cocaine    Social History   Socioeconomic History  . Marital status: Divorced    Spouse name: Not on file  . Number of children: Not on file  . Years of education: Not on file  . Highest education level: Not on file  Occupational History  . Not on file  Tobacco Use  . Smoking status: Current Every Day Smoker    Packs/day: 1.00    Years: 40.00    Pack years: 40.00    Types: Cigarettes  . Smokeless tobacco: Never Used  Substance and Sexual Activity  . Alcohol use: No  . Drug use: Yes    Types: Cocaine  . Sexual activity: Yes  Other Topics Concern  . Not on file  Social History Narrative  . Not on file   Social Determinants of Health   Financial Resource Strain:   . Difficulty of Paying Living Expenses: Not on file  Food Insecurity:   . Worried About Programme researcher, broadcasting/film/video in the Last Year: Not on file  . Ran Out of Food in the Last Year: Not on file  Transportation Needs:   . Lack of Transportation (Medical): Not on file  . Lack of Transportation (Non-Medical): Not on file  Physical Activity:   . Days of Exercise per Week: Not on file  .  Minutes of Exercise per Session: Not on file  Stress:   . Feeling of Stress : Not on file  Social Connections:   . Frequency of Communication with Friends and Family: Not on file  . Frequency of Social Gatherings with Friends and Family: Not on file  . Attends Religious Services: Not on file  . Active Member of Clubs or Organizations: Not on file  . Attends Banker Meetings: Not on file  . Marital Status: Not on file    Hospital Course:  Patient remained on the BMU unit for 1 days. The patient stabilized on medication and therapy. Patient was discharged on Klonopin 0.5 mg twice daily, Depakote ER 250 mg every morning and 750 mg nightly, Aricept 10 mg  daily, glipizide 10 mg twice daily, Synthroid 50 mcg daily, metformin 1000 mg twice daily with meals, and Vega 9 mg every morning, simvastatin 20 mg nightly, and Januvia.  Patient requested prescriptions to be E prescribed to medical [facility] since he had limited transportation and his sister will be picking him up.  Pharmacy was contacted and prescriptions for Depakote, metformin, and Invega were the only prescriptions they needed and stated the patient had multiple refills on his other medications.  These medications were E prescribed.  Patient has shown improvement with improved mood, affect, sleep, appetite, and interaction. Patient has attended group and participated. Patient has been seen in the day room interacting with peers and staff appropriately. Patient denies any SI/HI/AVH and contracts for safety. Patient agrees to follow up at [facility].  Physical Findings: AIMS:  , ,  ,  ,    CIWA:    COWS:     Musculoskeletal: Strength & Muscle Tone: within normal limits Gait & Station: normal Patient leans: N/A  Psychiatric Specialty Exam: Physical Exam  Nursing note and vitals reviewed. Constitutional: He is oriented to person, place, and time. He appears well-developed and well-nourished.  Cardiovascular: Normal rate.  Respiratory: Effort normal.  Musculoskeletal:        General: Normal range of motion.  Neurological: He is alert and oriented to person, place, and time.  Skin: Skin is warm.    Review of Systems  Constitutional: Negative.   HENT: Negative.   Eyes: Negative.   Respiratory: Negative.   Cardiovascular: Negative.   Gastrointestinal: Negative.   Genitourinary: Negative.   Musculoskeletal: Negative.   Skin: Negative.   Neurological: Negative.   Psychiatric/Behavioral: Negative.     Blood pressure 133/84, pulse 78, temperature 97.8 F (36.6 C), temperature source Oral, resp. rate 16, height 5\' 8"  (1.727 m), weight 69.4 kg, SpO2 97 %.Body mass index is  23.26 kg/m.   General Appearance: Casual  Eye Contact::  Fair  Speech:  Slow409  Volume:  Decreased  Mood:  Euthymic  Affect:  Constricted  Thought Process:  Coherent  Orientation:  Full (Time, Place, and Person)  Thought Content:  Rumination and Tangential  Suicidal Thoughts:  No  Homicidal Thoughts:  No  Memory:  Immediate;   Fair Recent;   Fair Remote;   Fair  Judgement:  Fair  Insight:  Fair  Psychomotor Activity:  Normal  Concentration:  Fair  Recall:  [id number] of Knowledge:Fair  Language: Fair  Akathisia:  No  Handed:  Right  AIMS (if indicated):     Assets:  Desire for Improvement Housing Resilience Social Support  Sleep:  Number of Hours: 6  Cognition: Impaired,  Mild  ADL's:  Intact  Has this patient used any form of tobacco in the last 30 days? (Cigarettes, Smokeless Tobacco, Cigars, and/or Pipes) Yes, No  Blood Alcohol level:  Lab Results  Component Value Date   ETH <10 10/17/2019   ETH <5 07/03/2015    Metabolic Disorder Labs:  Lab Results  Component Value Date   HGBA1C 6.5 (H) 07/04/2015   Lab Results  Component Value Date   PROLACTIN 21.9 (H) 10/18/2019   Lab Results  Component Value Date   CHOL 113 07/04/2015   TRIG 118 07/04/2015   HDL 30 (L) 07/04/2015   CHOLHDL 3.8 07/04/2015   VLDL 24 07/04/2015   LDLCALC 59 07/04/2015   LDLCALC 41 09/21/2014    See Psychiatric Specialty Exam and Suicide Risk Assessment completed by Attending Physician prior to discharge.  Discharge destination:  Home  Is patient on multiple antipsychotic therapies at discharge:  No   Has Patient had three or more failed trials of antipsychotic monotherapy by history:  No  Recommended Plan for Multiple Antipsychotic Therapies: NA  Discharge Instructions    Diet - low sodium heart healthy   Complete by: As directed    Increase activity slowly   Complete by: As directed      Allergies as of 10/19/2019      Reactions   Haldol [haloperidol] Other  (See Comments)   Reaction:  Agitation  Pt states that he also experiences lockjaw from this medication.        Medication List    STOP taking these medications   quinapril 5 MG tablet Commonly known as: ACCUPRIL   risperidone 4 MG tablet Commonly known as: RISPERDAL   trihexyphenidyl 5 MG tablet Commonly known as: ARTANE     TAKE these medications     Indication  clonazePAM 0.5 MG tablet Commonly known as: KLONOPIN Take 0.5 mg by mouth 2 (two) times daily.  Indication: Feeling Anxious   divalproex 250 MG 24 hr tablet Commonly known as: DEPAKOTE ER Take 1 tablet (250 mg total) by mouth every morning. What changed:   how much to take  when to take this  additional instructions  Indication: Schizoaffective   divalproex 250 MG 24 hr tablet Commonly known as: DEPAKOTE ER Take 3 tablets (750 mg total) by mouth at bedtime. What changed: You were already taking a medication with the same name, and this prescription was added. Make sure you understand how and when to take each.  Indication: Schizoaffective   donepezil 10 MG tablet Commonly known as: ARICEPT Take 10 mg by mouth daily.  Indication: Alzheimer's Disease   glipiZIDE 10 MG 24 hr tablet Commonly known as: GLUCOTROL XL Take 10 mg by mouth 2 (two) times daily.  Indication: Type 2 Diabetes   Invega 9 MG 24 hr tablet Generic drug: paliperidone Take 1 tablet (9 mg total) by mouth every morning.  Indication: Schizoaffective Disorder   levothyroxine 50 MCG tablet Commonly known as: SYNTHROID Take 50 mcg by mouth daily.  Indication: Underactive Thyroid   metformin 1000 MG (OSM) 24 hr tablet Commonly known as: FORTAMET Take 1 tablet (1,000 mg total) by mouth 2 (two) times daily with a meal. What changed:   medication strength  when to take this  Indication: Type 2 Diabetes   simvastatin 20 MG tablet Commonly known as: ZOCOR Take 20 mg by mouth at bedtime.  Indication: High Amount of Fats in the  Blood   sitaGLIPtin 100 MG tablet Commonly known as: JANUVIA Take 100 mg by mouth  daily.  Indication: Type 2 Diabetes   Ventolin HFA 108 (90 Base) MCG/ACT inhaler Generic drug: albuterol Inhale 2 puffs into the lungs every 4 (four) hours as needed.  Indication: Asthma      Follow-up [facility]. Follow up on 10/20/2019.   Why: Your ACT team will follow up with you on 10/20/19. Thanks! Contact information: [place] [address] [phone]           Follow-up recommendations:  Continue activity as tolerated. Continue diet as recommended by your PCP. Ensure to keep all appointments with outpatient providers.  Comments:  Patient is instructed prior to discharge to: Take all medications as prescribed by his/her mental healthcare provider. Report any adverse effects and or reactions from the medicines to his/her outpatient provider promptly. Patient has been instructed & cautioned: To not engage in alcohol and or illegal drug use while on prescription medicines. In the event of worsening symptoms, patient is instructed to call the crisis hotline, 911 and or go to the nearest ED for appropriate evaluation and treatment of symptoms. To follow-up with his/her primary care provider for your other medical issues, concerns and or health care needs.    Signed: Lowry Ram Jesten Cappuccio, FNP 10/19/2019, 2:18 PM

## 2019-10-19 NOTE — Progress Notes (Signed)
Recreation Therapy Notes  Date: 10/19/2019  Time: 9:30 am  Location: Craft Room  Behavioral response: Appropriate   Intervention Topic: Problem -Solving    Discussion/Intervention:  Group content on today was focused on problem solving. The group described what problem solving is. Patients expressed how problems affect them and how they deal with problems. Individuals identified healthy ways to deal with problems. Patients explained what normally happens to them when they do not deal with problems. The group expressed reoccurring problems for them. The group participated in the intervention "Ways to Solve problems" where patients were given a chance to explore different ways to solve problems.  Clinical Observations/Feedback:  Patient came to group and defined problem solving as thinking about your issues. He stated that he solves his problems by writing things down and talking to others. Participant explained that not solving problems leads to self- destruction and being upset all the time. Individual was social with peers and staff while participating in the intervention.   Lanecia Sliva LRT/CTRS         Anastazia Creek 10/19/2019 11:33 AM

## 2019-10-19 NOTE — Progress Notes (Signed)
Patent approached Clinical research associate and he stated " my mind is racing I am tired I don't feel ready to go home, I may hurt myself" patient  Currently contracts for safety, he stated ' I need you to the doctor"writer assured him his message will be communicated to HCP. 15 minutes safety checks maintained will continue to monitor.

## 2019-10-19 NOTE — [facility] Group Notes (Signed)
[facility] Group Notes:  (Nursing/MHT/Case Management/Adjunct)  Date:  10/19/2019  Time:  8:55 AM  Type of Therapy:  Community Group  Participation Level:  Did Not Attend  Justin Weeks [name] 10/19/2019, 8:55 AM 

## 2019-10-19 NOTE — Progress Notes (Signed)
  [facility] Adult Case Management Discharge Plan :  Will you be returning to the same living situation after discharge:  Yes,  home At discharge, do you have transportation home?: Yes,  pts sister Do you have the ability to pay for your medications: Yes,  Medicare  Release of information consent forms completed and in the chart;   Patient to Follow up at: Follow-up Information    [address] [facility] [place] Follow up on 10/20/2019.   Why: Your ACT team will follow up with you on 10/20/19. Thanks! Contact information: [address] [place] [address] [place] [address] [phone]           Next level of care provider has access to [facility] Link:no  Safety Planning and Suicide Prevention discussed: Yes,  SPE completed with pt as pt declined collateral contact     Has patient been referred to the Quitline?: N/A patient is not a smoker  Patient has been referred for addiction treatment: N/A  Mechele Dawley, LCSW 10/19/2019, 9:57 AM

## 2019-10-19 NOTE — Progress Notes (Signed)
Pt denies SI, HI and AVH. Pt was educated on dc plan and verbalizes understanding. Pt received dc packet and belongings. Braeden Kennan RN 

## 2019-10-19 NOTE — Plan of Care (Signed)
  Problem: Group Participation Goal: STG - Patient will engage in groups without prompting or encouragement from LRT x3 group sessions within 5 recreation therapy group sessions Description: STG - Patient will engage in groups without prompting or encouragement from LRT x3 group sessions within 5 recreation therapy group sessions 10/19/2019 1143 by Alveria Apley, LRT Outcome: Adequate for Discharge 10/19/2019 1143 by Alveria Apley, LRT Outcome: Adequate for Discharge

## 2019-10-19 NOTE — [facility] Suicide Risk Assessment (Signed)
[facility] Discharge Suicide Risk Assessment   Principal Problem: Schizoaffective disorder [facility]) Discharge Diagnoses: Principal Problem:   Schizoaffective disorder (HCC) Active Problems:   Diabetes (HCC)   Hypothyroidism   HTN (hypertension)   Total Time spent with patient: 30 minutes  Musculoskeletal: Strength & Muscle Tone: within normal limits Gait & Station: normal Patient leans: N/A  Psychiatric Specialty Exam: Review of Systems  Constitutional: Negative.   HENT: Negative.   Eyes: Negative.   Respiratory: Negative.   Cardiovascular: Negative.   Gastrointestinal: Negative.   Musculoskeletal: Negative.   Skin: Negative.   Neurological: Negative.   Psychiatric/Behavioral: Negative.     Blood pressure 133/84, pulse 78, temperature 97.8 F (36.6 C), temperature source Oral, resp. rate 16, height 5\' 8"  (1.727 m), weight 69.4 kg, SpO2 97 %.Body mass index is 23.26 kg/m.  General Appearance: Casual  Eye Contact::  Fair  Speech:  Slow409  Volume:  Decreased  Mood:  Euthymic  Affect:  Constricted  Thought Process:  Coherent  Orientation:  Full (Time, Place, and Person)  Thought Content:  Rumination and Tangential  Suicidal Thoughts:  No  Homicidal Thoughts:  No  Memory:  Immediate;   Fair Recent;   Fair Remote;   Fair  Judgement:  Fair  Insight:  Fair  Psychomotor Activity:  Normal  Concentration:  Fair  Recall:  [id number] of Knowledge:Fair  Language: Fair  Akathisia:  No  Handed:  Right  AIMS (if indicated):     Assets:  Desire for Improvement Housing Resilience Social Support  Sleep:  Number of Hours: 6  Cognition: Impaired,  Mild  ADL's:  Intact   Mental Status Per Nursing Assessment::   On Admission:  Suicidal ideation indicated by patient, Suicidal ideation indicated by others  Demographic Factors:  Male, Low socioeconomic status and Living alone  Loss Factors: NA  Historical Factors: Impulsivity  Risk Reduction Factors:   Positive social  support and Positive therapeutic relationship  Continued Clinical Symptoms:  Schizophrenia:   Paranoid or undifferentiated type  Cognitive Features That Contribute To Risk:  None    Suicide Risk:  Minimal: No identifiable suicidal ideation.  Patients presenting with no risk factors but with morbid ruminations; may be classified as minimal risk based on the severity of the depressive symptoms  Follow-up Information    [id number] [facility] [place] Follow up.   Why: Your ACT team will follow up with you on Contact information: [address] [place] [phone]           Plan Of Care/Follow-up recommendations:  Activity:  activity as tolerated Diet:  diebetic Other:  follow up [phone] Act team  Frederich Chick, MD 10/19/2019, 9:51 AM

## 2019-10-19 NOTE — Progress Notes (Signed)
Recreation Therapy Notes  INPATIENT RECREATION TR PLAN  Patient Details Name: Justin Weeks MRN: [id number] DOB: 01/04/1961 Today's Date: 10/19/2019  Rec Therapy Plan Is patient appropriate for Therapeutic Recreation?: Yes Treatment times per week: At least 3 Estimated Length of Stay: 5-7 days TR Treatment/Interventions: Group participation (Comment)  Discharge Criteria Pt will be discharged from therapy if:: Discharged Treatment plan/goals/alternatives discussed and agreed upon by:: Patient/family  Discharge Summary Short term goals set: Patient will engage in groups without prompting or encouragement from LRT x3 group sessions within 5 recreation therapy group sessions Short term goals met: Adequate for discharge Progress toward goals comments: Groups attended Which groups?: Other (Comment)(Problem Solving) Reason goals not met: N/A Therapeutic equipment acquired: N/A Reason patient discharged from therapy: Discharge from hospital Pt/family agrees with progress & goals achieved: Yes Date patient discharged from therapy: 10/19/19   Treazure Nery 10/19/2019, 11:44 AM

## 2020-02-20 ENCOUNTER — Emergency Department
Admission: EM | Admit: 2020-02-20 | Discharge: 2020-02-21 | Disposition: A | Discharge status: Other Acute Inpatient Hospital | Payer: Medicare Other | Source: Home / Self Care | Attending: Emergency Medicine | Admitting: Emergency Medicine

## 2020-02-20 ENCOUNTER — Other Ambulatory Visit: Payer: Self-pay

## 2020-02-20 ENCOUNTER — Encounter: Payer: Self-pay | Admitting: Emergency Medicine

## 2020-02-20 DIAGNOSIS — E039 Hypothyroidism, unspecified: Secondary | ICD-10-CM | POA: Diagnosis present

## 2020-02-20 DIAGNOSIS — I10 Essential (primary) hypertension: Secondary | ICD-10-CM | POA: Insufficient documentation

## 2020-02-20 DIAGNOSIS — F17200 Nicotine dependence, unspecified, uncomplicated: Secondary | ICD-10-CM | POA: Diagnosis present

## 2020-02-20 DIAGNOSIS — Z79899 Other long term (current) drug therapy: Secondary | ICD-10-CM | POA: Insufficient documentation

## 2020-02-20 DIAGNOSIS — E119 Type 2 diabetes mellitus without complications: Secondary | ICD-10-CM | POA: Insufficient documentation

## 2020-02-20 DIAGNOSIS — R410 Disorientation, unspecified: Secondary | ICD-10-CM

## 2020-02-20 DIAGNOSIS — F17210 Nicotine dependence, cigarettes, uncomplicated: Secondary | ICD-10-CM | POA: Insufficient documentation

## 2020-02-20 DIAGNOSIS — R45851 Suicidal ideations: Secondary | ICD-10-CM

## 2020-02-20 DIAGNOSIS — F250 Schizoaffective disorder, bipolar type: Secondary | ICD-10-CM | POA: Insufficient documentation

## 2020-02-20 DIAGNOSIS — J45909 Unspecified asthma, uncomplicated: Secondary | ICD-10-CM | POA: Insufficient documentation

## 2020-02-20 DIAGNOSIS — Z91199 Patient's noncompliance with other medical treatment and regimen due to unspecified reason: Secondary | ICD-10-CM

## 2020-02-20 DIAGNOSIS — E785 Hyperlipidemia, unspecified: Secondary | ICD-10-CM | POA: Diagnosis present

## 2020-02-20 DIAGNOSIS — Z7984 Long term (current) use of oral hypoglycemic drugs: Secondary | ICD-10-CM | POA: Insufficient documentation

## 2020-02-20 DIAGNOSIS — F259 Schizoaffective disorder, unspecified: Secondary | ICD-10-CM | POA: Diagnosis present

## 2020-02-20 DIAGNOSIS — Z20822 Contact with and (suspected) exposure to covid-19: Secondary | ICD-10-CM | POA: Insufficient documentation

## 2020-02-20 DIAGNOSIS — E860 Dehydration: Secondary | ICD-10-CM

## 2020-02-20 HISTORY — DX: Schizoaffective disorder, unspecified: F25.9

## 2020-02-20 LAB — URINE DRUG SCREEN, QUALITATIVE (ARMC ONLY)
Amphetamines, Ur Screen: NOT DETECTED
Barbiturates, Ur Screen: NOT DETECTED
Benzodiazepine, Ur Scrn: NOT DETECTED
Cannabinoid 50 Ng, Ur ~~LOC~~: NOT DETECTED
Cocaine Metabolite,Ur ~~LOC~~: NOT DETECTED
MDMA (Ecstasy)Ur Screen: NOT DETECTED
Methadone Scn, Ur: NOT DETECTED
Opiate, Ur Screen: NOT DETECTED
Phencyclidine (PCP) Ur S: NOT DETECTED
Tricyclic, Ur Screen: NOT DETECTED

## 2020-02-20 LAB — CBC
HCT: 38.5 % — ABNORMAL LOW (ref 39.0–52.0)
Hemoglobin: 13.6 g/dL (ref 13.0–17.0)
MCH: 29.5 pg (ref 26.0–34.0)
MCHC: 35.3 g/dL (ref 30.0–36.0)
MCV: 83.5 fL (ref 80.0–100.0)
Platelets: 237 10*3/uL (ref 150–400)
RBC: 4.61 MIL/uL (ref 4.22–5.81)
RDW: 12.2 % (ref 11.5–15.5)
WBC: 5.3 10*3/uL (ref 4.0–10.5)
nRBC: 0 % (ref 0.0–0.2)

## 2020-02-20 LAB — COMPREHENSIVE METABOLIC PANEL
ALT: 28 U/L (ref 0–44)
AST: 53 U/L — ABNORMAL HIGH (ref 15–41)
Albumin: 4.1 g/dL (ref 3.5–5.0)
Alkaline Phosphatase: 82 U/L (ref 38–126)
Anion gap: 12 (ref 5–15)
BUN: 24 mg/dL — ABNORMAL HIGH (ref 8–23)
CO2: 25 mmol/L (ref 22–32)
Calcium: 9.3 mg/dL (ref 8.9–10.3)
Chloride: 94 mmol/L — ABNORMAL LOW (ref 98–111)
Creatinine, Ser: 1.41 mg/dL — ABNORMAL HIGH (ref 0.61–1.24)
GFR calc Af Amer: >60 mL/min (ref >60)
GFR calc non Af Amer: 53 mL/min — ABNORMAL LOW (ref >60)
Glucose, Bld: 245 mg/dL — ABNORMAL HIGH (ref 70–99)
Potassium: 3.7 mmol/L (ref 3.5–5.1)
Sodium: 131 mmol/L — ABNORMAL LOW (ref 135–145)
Total Bilirubin: 1 mg/dL (ref 0.3–1.2)
Total Protein: 8 g/dL (ref 6.5–8.1)

## 2020-02-20 LAB — GLUCOSE, CAPILLARY
Glucose-Capillary: 157 mg/dL — ABNORMAL HIGH (ref 70–99)
Glucose-Capillary: 254 mg/dL — ABNORMAL HIGH (ref 70–99)

## 2020-02-20 LAB — SALICYLATE LEVEL: Salicylate Lvl: <7 mg/dL — ABNORMAL LOW (ref 7.0–30.0)

## 2020-02-20 LAB — ETHANOL: Alcohol, Ethyl (B): <10 mg/dL (ref <10)

## 2020-02-20 LAB — ACETAMINOPHEN LEVEL: Acetaminophen (Tylenol), Serum: <10 ug/mL — ABNORMAL LOW (ref 10–30)

## 2020-02-20 MED ORDER — METFORMIN HCL ER 500 MG PO TB24
1000.0000 mg | ORAL_TABLET | Freq: Two times a day (BID) | ORAL | Status: DC
  Filled 2020-02-20 (×2): qty 2

## 2020-02-20 MED ORDER — GLIPIZIDE ER 5 MG PO TB24
5.0000 mg | ORAL_TABLET | Freq: Every day | ORAL | Status: DC
  Filled 2020-02-20: qty 1

## 2020-02-20 MED ORDER — DIVALPROEX SODIUM ER 250 MG PO TB24
750.0000 mg | ORAL_TABLET | Freq: Every day | ORAL | Status: DC
  Administered 2020-02-20: 750 mg via ORAL
  Filled 2020-02-20: qty 3

## 2020-02-20 MED ORDER — DONEPEZIL HCL 5 MG PO TABS: 10.0000 mg | ORAL_TABLET | Freq: Every day | ORAL | Status: DC

## 2020-02-20 MED ORDER — SIMVASTATIN 10 MG PO TABS
20.0000 mg | ORAL_TABLET | Freq: Every day | ORAL | Status: DC
  Administered 2020-02-20: 20 mg via ORAL
  Filled 2020-02-20: qty 2

## 2020-02-20 MED ORDER — LEVOTHYROXINE SODIUM 50 MCG PO TABS: 50.0000 ug | ORAL_TABLET | Freq: Every day | ORAL | Status: DC

## 2020-02-20 MED ORDER — SODIUM CHLORIDE 0.9 % IV BOLUS: 500.0000 mL | Freq: Once | INTRAVENOUS | Status: DC

## 2020-02-20 MED ORDER — LINAGLIPTIN 5 MG PO TABS
5.0000 mg | ORAL_TABLET | Freq: Every day | ORAL | Status: DC
  Filled 2020-02-20 (×2): qty 1

## 2020-02-20 MED ORDER — DIVALPROEX SODIUM ER 250 MG PO TB24: 250.0000 mg | ORAL_TABLET | Freq: Every morning | ORAL | Status: DC

## 2020-02-20 NOTE — ED Notes (Addendum)
Patient belongings: pair socks, pair black tennis shoes, pair jeans, grey shirt, pair boxers, set of keys, cell phone, charger, wallet, black belt.

## 2020-02-20 NOTE — ED Notes (Signed)
IVC  with all papers on chart, awaiting TTS/Psych consult

## 2020-02-20 NOTE — ED Notes (Signed)
Going to 23.

## 2020-02-20 NOTE — ED Notes (Signed)
Hourly rounding reveals patient in room. No complaints, stable, in no acute distress. Q15 minute rounds and monitoring via Rover and Officer to continue.   

## 2020-02-20 NOTE — ED Notes (Signed)
Patient requested to put his sisters number in his chart for collateral. Stanton Kidney  [phone]

## 2020-02-20 NOTE — BH Assessment (Signed)
Assessment Note  Justin Weeks is an 59 y.o. male presents to the ED via [facility] on IVC paperwork. Per initial triage note, "Patient to ER via BPD from patient's home for c/o patient needing psych eval and being unable to care for self. Per sister who took out IVC papers, patient has been missing for one week and believes he has not eaten in that time period. Patient states he has been off of all his medications for approx one month due to not liking how they make him feel. Patient is calm and cooperative at this time. Patient has reported injuries to feet d/t walking around town/self-neglect."   Writer was able to assess patient. Patient's speech was tangential in nature with no specific relevance or direction. Patient appeared mildly anxious and irritated but answered the questions to the best of his ability. When asked what brought him to the hospital, he reported, " I got missing for 7 days". "I got a lot of pressure. They was coming on the property anyhow".   Writer was able to speak with patients sister, Justin Weeks [phone]). She reports, "this is the worst I have seen my brother". "That social worker isnt looking after him making sure he take his meds". "people are in and out of his house using him and using drugs. I told him to leave that prostitute alone!". "My mom use to take care of him but she has died. This is affecting all of [place]. They all look to me to make sure things are going and I try to do what I can". "But he needs help!" "his girlfriend told me he was taking all the stuff out of his house and turning in his keys". "When I went to check on him, I didn't know who opened the door! I didn't know who he was!".    This case was staffed with Kennyth Lose, NP and Jacqualine Code, MD. Patient is appropriate for admission for medication stabilization and writer will work to have patient admitted to the BMU.    Diagnosis: Schizoaffective D/O   Past Medical History:  Past Medical History:  Diagnosis Date  .  Arthritis   . Asthma   . Blood transfusion without reported diagnosis   . Diabetes mellitus without complication ([place])   . Hypertension   . Hypothyroid 07/03/2015  . Personal history of tobacco use, presenting hazards to health 05/31/2015  . Schizoaffective disorder ([place])   . Seizures ([place])     History reviewed. No pertinent surgical history.  Family History: No family history on file.  Social History:  reports that he has been smoking cigarettes. He has a 40.00 pack-year smoking history. He has never used smokeless tobacco. He reports current drug use. Drug: Cocaine. He reports that he does not drink alcohol.  Additional Social History:  Alcohol / Drug Use Pain Medications: See PTA Prescriptions: See PTA Over the Counter: See PTA History of alcohol / drug use?: Yes  CIWA: CIWA-Ar BP: 121/85 Pulse Rate: 95 COWS:    Allergies:  Allergies  Allergen Reactions  . 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      Home Medications: (Not in a hospital admission)   OB/GYN Status:  No LMP for male patient.  General Assessment Data Location of Assessment: [facility] ED TTS Assessment: In system Is this a Tele or Face-to-Face Assessment?: Face-to-Face Is this an Initial Assessment or a Re-assessment for this encounter?: Initial  Assessment Patient Accompanied by:: N/A Language Other than English: No Living Arrangements:  (private home) What gender do you identify as?: Male Marital status: Single Living Arrangements: Alone Can pt return to current living arrangement?: Yes Admission Status: Involuntary Petitioner: Police Is patient capable of signing voluntary admission?: No Referral Source: Self/Family/Friend Insurance type:  (Medicare)  Medical Screening Exam [facility] Walk-in ONLY) Medical Exam completed: Yes  Crisis Care Plan Living Arrangements: Alone Legal Guardian:  (Self) Name of  Psychiatrist:  ([facility]) Name of Therapist:  ([facility])  Education Status Is patient currently in school?: No Is the patient employed, unemployed or receiving disability?: Receiving disability income  Risk to self with the past 6 months Suicidal Ideation: No Has patient been a risk to self within the past 6 months prior to admission? : No Suicidal Intent: No Has patient had any suicidal intent within the past 6 months prior to admission? : No Is patient at risk for suicide?: No Suicidal Plan?: No Has patient had any suicidal plan within the past 6 months prior to admission? : No Access to Means: No Intentional Self Injurious Behavior: None Family Suicide History: Unable to assess Recent stressful life event(s): Other (Comment) Persecutory voices/beliefs?: No Depression: Yes Depression Symptoms: Feeling angry/irritable Substance abuse history and/or treatment for substance abuse?: Yes Suicide prevention information given to non-admitted patients: Not applicable  Risk to Others within the past 6 months Homicidal Ideation: No Does patient have any lifetime risk of violence toward others beyond the six months prior to admission? : Unknown Thoughts of Harm to Others: No Current Homicidal Intent: No Current Homicidal Plan: No Access to Homicidal Means: No History of harm to others?: No Assessment of Violence: None Noted Criminal Charges Pending?: No Does patient have a court date: No Is patient on probation?: No  Psychosis Hallucinations: None noted Delusions: None noted  Mental Status Report Appearance/Hygiene: In scrubs Eye Contact: Fair Motor Activity: Unable to assess Speech: Tangential, Pressured Level of Consciousness: Alert Mood: Anxious, Pleasant Affect: Anxious, Inconsistent with thought content Anxiety Level: Minimal Thought Processes: Tangential Judgement: Unable to Assess Orientation: Person, Place, Time, Situation, Appropriate for developmental age Obsessive  Compulsive Thoughts/Behaviors: Unable to Assess  Cognitive Functioning Concentration: Normal Memory: Unable to Assess Is patient IDD: No Insight: Unable to Assess Impulse Control: Unable to Assess Appetite: Fair Have you had any weight changes? : No Change Sleep: Unable to Assess  ADLScreening [facility] Assessment Services) Patient's cognitive ability adequate to safely complete daily activities?: Yes Patient able to express need for assistance with ADLs?: Yes Independently performs ADLs?: Yes (appropriate for developmental age)  Prior Inpatient Therapy Prior Inpatient Therapy: Yes  Prior Outpatient Therapy Prior Outpatient Therapy: Yes Does patient have an ACCT team?: Unknown Does patient have Intensive In-House Services?  : Unknown Does patient have [facility] services? : Unknown Does patient have P4CC services?: Unknown  ADL Screening (condition at time of admission) Patient's cognitive ability adequate to safely complete daily activities?: Yes Is the patient deaf or have difficulty hearing?: No Does the patient have difficulty seeing, even when wearing glasses/contacts?: No Does the patient have difficulty concentrating, remembering, or making decisions?: No Patient able to express need for assistance with ADLs?: Yes Does the patient have difficulty dressing or bathing?: No Independently performs ADLs?: Yes (appropriate for developmental age) Does the patient have difficulty walking or climbing stairs?: No Weakness of Legs: None Weakness of Arms/Hands: None  Home Assistive Devices/Equipment Home Assistive Devices/Equipment: None  Therapy Consults (therapy consults require a physician order) PT  Evaluation Needed: No OT Evalulation Needed: No SLP Evaluation Needed: No Abuse/Neglect Assessment (Assessment to be complete while patient is alone) Abuse/Neglect Assessment Can Be Completed: Yes Physical Abuse: Denies Verbal Abuse: Denies Sexual Abuse: Denies Exploitation of  patient/patient's resources: Denies Self-Neglect: Denies Values / Beliefs Cultural Requests During Hospitalization: None Spiritual Requests During Hospitalization: None Consults Spiritual Care Consult Needed: No Transition of Care Team Consult Needed: No Advance Directives (For Healthcare) Does Patient Have a Medical Advance Directive?: No Would patient like information on creating a medical advance directive?: No - Patient declined          Disposition:  Disposition Initial Assessment Completed for this Encounter: Yes Patient referred to:  (inpatient)  On Site Evaluation by:   Reviewed with Physician:    Willene Hatchet, MSc.,LCMHC, [facility] 02/20/2020 10:50 PM

## 2020-02-20 NOTE — ED Notes (Signed)
Report to include Situation, Background, Assessment, and Recommendations received from RN. Patient alert and oriented, warm and dry, in no acute distress. Patient denies SI, HI, AVH and pain. Patient made aware of Q15 minute rounds and Rover and Officer presence for their safety. Patient instructed to come to me with needs or concerns.   

## 2020-02-20 NOTE — ED Triage Notes (Signed)
Patient to ER via BPD from patient's home for c/o patient needing psych eval and being unable to care for self. Per sister who took out IVC papers, patient has been missing for one week and believes he has not eaten in that time period. Patient states he has been off of all his medications for approx one month due to not liking how they make him feel. Patient is calm and cooperative at this time. Patient has reported injuries to feet d/t walking around town/self-neglect.

## 2020-02-20 NOTE — ED Provider Notes (Signed)
[facility] Emergency Department Provider Note   ____________________________________________   First MD Initiated Contact with Patient 02/20/20 1625     (approximate)  I have reviewed the triage vital signs and the nursing notes.   HISTORY  Chief Complaint Psychiatric Evaluation    HPI Justin Weeks is a 59 y.o. male here for evaluation for being fed up  Patient reports that people keep coming into his yard around his apartment.  Because all sorts of problems but "I am not get a little man"  Reports he got fed up with this and decided to leave his apartment.  He reports has been homeless walking about for about a week now.  Not had much at all to eat or drink.  Feels a bit fatigued and dry  Denies any desire to hurt himself or anyone else.  However he does seem to relate the people keep coming into his yard attempting to get into his apartment which was prompted him to leave it.  He is under involuntary commitment initiated by the Police Department  No fevers or chills.  Reports he has been vaccinated against Covid.  Has not been taking his medication for about a week.  Reports that he does not think he has schizophrenia  Past Medical History:  Diagnosis Date   Arthritis    Asthma    Blood transfusion without reported diagnosis    Diabetes mellitus without complication (HCC)    Hypertension    Hypothyroid 07/03/2015   Personal history of tobacco use, presenting hazards to health 05/31/2015   Schizoaffective disorder (HCC)    Seizures (HCC)     Patient Active Problem List   Diagnosis Date Noted   Schizoaffective disorder (HCC) 10/17/2019   Suicidal ideation    Schizoaffective disorder, bipolar type (HCC) 07/04/2015   HTN (hypertension) 07/04/2015   Dyslipidemia 07/04/2015   Tobacco use disorder 07/04/2015   Noncompliance 07/03/2015   Hypothyroidism 07/03/2015   Diabetes (HCC) 04/20/2015    History reviewed. No  pertinent surgical history.  Prior to Admission medications   Medication Sig Start Date End Date Taking? Authorizing Provider  clonazePAM (KLONOPIN) 0.5 MG tablet Take 0.5 mg by mouth 2 (two) times daily.    Yes [provider]  divalproex (DEPAKOTE ER) 250 MG 24 hr tablet Take 1 tablet (250 mg total) by mouth every morning. 10/19/19  Yes Money, Gerlene Burdock, FNP  divalproex (DEPAKOTE ER) 250 MG 24 hr tablet Take 3 tablets (750 mg total) by mouth at bedtime. 10/19/19  Yes Money, Gerlene Burdock, FNP  donepezil (ARICEPT) 10 MG tablet Take 10 mg by mouth daily. 09/13/19  Yes [provider]  glipiZIDE (GLUCOTROL XL) 10 MG 24 hr tablet Take 10 mg by mouth 2 (two) times daily.   Yes [provider]  INVEGA 9 MG 24 hr tablet Take 1 tablet (9 mg total) by mouth every morning. 10/19/19  Yes Money, Gerlene Burdock, FNP  levothyroxine (SYNTHROID, LEVOTHROID) 50 MCG tablet Take 50 mcg by mouth daily.    Yes [provider]  metFORMIN (FORTAMET) 1000 MG (OSM) 24 hr tablet Take 1 tablet (1,000 mg total) by mouth 2 (two) times daily with a meal. 10/19/19  Yes Money, Gerlene Burdock, FNP  simvastatin (ZOCOR) 20 MG tablet Take 20 mg by mouth at bedtime.    Yes [provider]  sitaGLIPtin (JANUVIA) 100 MG tablet Take 100 mg by mouth daily.   Yes [provider]  VENTOLIN HFA 108 (90  Base) MCG/ACT inhaler Inhale 2 puffs into the lungs every 4 (four) hours as needed. 06/22/19  Yes [provider]    Allergies Haldol [haloperidol]  No family history on file.  Social History Social History   Tobacco Use   Smoking status: Current Every Day Smoker    Packs/day: 1.00    Years: 40.00    Pack years: 40.00    Types: Cigarettes   Smokeless tobacco: Never Used  Scientific laboratory technician Use: Unknown  Substance Use Topics   Alcohol use: No   Drug use: Yes    Types: Cocaine    Review of Systems Constitutional: No fever/chills.  Feels a little bit "dehydrated" Eyes: No  visual changes.  Does report seeing people trying to get in and out of his home and walking about in his "yard" ENT: No sore throat. Cardiovascular: Denies chest pain. Respiratory: Denies shortness of breath. Gastrointestinal: No abdominal pain.  Feels hungry. Genitourinary: Negative for dysuria. Musculoskeletal: Negative for back pain. Skin: Negative for rash. Neurological: Negative for headaches, areas of focal weakness or numbness.    ____________________________________________   PHYSICAL EXAM:  VITAL SIGNS: ED Triage Vitals  Enc Vitals Group     BP 02/20/20 1514 (!) 92/41     Pulse Rate 02/20/20 1514 89     Resp 02/20/20 1514 18     Temp 02/20/20 1514 98 F (36.7 C)     Temp Source 02/20/20 1514 Oral     SpO2 02/20/20 1514 99 %     Weight 02/20/20 1515 153 lb (69.4 kg)     Height 02/20/20 1515 5\' 8"  (1.727 m)     Head Circumference --      Peak Flow --      Pain Score 02/20/20 1514 4     Pain Loc --      Pain Edu? --      Excl. in [place]? --     Constitutional: Alert and oriented. Well appearing and in no acute distress. Eyes: Conjunctivae are normal. Head: Atraumatic. Nose: No congestion/rhinnorhea. Mouth/Throat: Mucous membranes are mildly dry. Neck: No stridor.  Cardiovascular: Normal rate, regular rhythm. Grossly normal heart sounds.  Good peripheral circulation. Respiratory: Normal respiratory effort.  No retractions. Lungs CTAB. Gastrointestinal: Soft and nontender. No distention. Musculoskeletal: No lower extremity tenderness nor edema. Neurologic:  Normal speech and language. No gross focal neurologic deficits are appreciated.  Skin:  Skin is warm, dry and intact. No rash noted. Psychiatric: Mood and affect are flat, at times speaking about people trying to possibly break into his home intermittently and out in his yard persistently. Speech and behavior are normal.  Question if he may be having some sort of hallucinations about people trying to break into his  home  ____________________________________________   LABS (all labs ordered are listed, but only abnormal results are displayed)  Labs Reviewed  COMPREHENSIVE METABOLIC PANEL - Abnormal; Notable for the following components:      Result Value   Sodium 131 (*)    Chloride 94 (*)    Glucose, Bld 245 (*)    BUN 24 (*)    Creatinine, Ser 1.41 (*)    AST 53 (*)    GFR calc non Af Amer 53 (*)    All other components within normal limits  SALICYLATE LEVEL - Abnormal; Notable for the following components:   Salicylate Lvl <1.0 (*)    All other components within normal limits  ACETAMINOPHEN LEVEL - Abnormal; Notable for  the following components:   Acetaminophen (Tylenol), Serum <10 (*)    All other components within normal limits  CBC - Abnormal; Notable for the following components:   HCT 38.5 (*)    All other components within normal limits  GLUCOSE, CAPILLARY - Abnormal; Notable for the following components:   Glucose-Capillary 157 (*)    All other components within normal limits  GLUCOSE, CAPILLARY - Abnormal; Notable for the following components:   Glucose-Capillary 254 (*)    All other components within normal limits  SARS CORONAVIRUS 2 BY RT PCR (HOSPITAL ORDER, PERFORMED IN [place] [facility])  ETHANOL  URINE DRUG SCREEN, QUALITATIVE ([facility] ONLY)  CBG MONITORING, ED  CBG MONITORING, ED  CBG MONITORING, ED  CBG MONITORING, ED   ____________________________________________  EKG   ____________________________________________  RADIOLOGY   ____________________________________________   PROCEDURES  Procedure(s) performed: None  Procedures  Critical Care performed: No  ____________________________________________   INITIAL IMPRESSION / ASSESSMENT AND PLAN / ED COURSE  Pertinent labs & imaging results that were available during my care of the patient were reviewed by me and considered in my medical decision making (see chart for details).   Patient  denies acute medical illness other than he appears to be a bit volume depleted with dry mucous membranes mildly elevated creatinine mild hyponatremia and appears slightly hypovolemic by exam.  Mild hypotension as well.  Off his current medications.  Have reordered several of his medications.  Under IVC.  Placed psychiatry consult.  Ordered gentle fluid bolus as well as diet ordered which patient reports he is hungry would like something to drink   BP is normalized after fluids.  Lab work reassuring.  Clinical Course as of Feb 20 2347  Mon Feb 20, 2020  2122 Discussed with psych NP Orson Slick), they recommend admission to psychiatry. Continue IVC.    [MQ]    Clinical Course User Index [MQ] Sharyn Creamer, MD   ----------------------------------------- 11:47 PM on 02/20/2020 -----------------------------------------  Ongoing care assigned to Dr. Don Perking.  Anticipate psychiatric admission.  On IVC  ____________________________________________   FINAL CLINICAL IMPRESSION(S) / ED DIAGNOSES  Final diagnoses:  Dehydration, mild  Disorientation        Note:  This document was prepared using Dragon voice recognition software and may include unintentional dictation errors       Sharyn Creamer, MD 02/20/20 2348

## 2020-02-21 ENCOUNTER — Inpatient Hospital Stay
Admission: EM | Admit: 2020-02-21 | Discharge: 2020-02-24 | DRG: 885 | Disposition: A | Discharge status: Home / Self Care | Payer: Medicare Other | Source: Intra-hospital | Attending: Psychiatry | Admitting: Psychiatry

## 2020-02-21 ENCOUNTER — Other Ambulatory Visit: Payer: Self-pay

## 2020-02-21 DIAGNOSIS — F203 Undifferentiated schizophrenia: Secondary | ICD-10-CM

## 2020-02-21 DIAGNOSIS — Z9114 Patient's other noncompliance with medication regimen: Secondary | ICD-10-CM | POA: Diagnosis not present

## 2020-02-21 DIAGNOSIS — I10 Essential (primary) hypertension: Secondary | ICD-10-CM | POA: Diagnosis present

## 2020-02-21 DIAGNOSIS — F250 Schizoaffective disorder, bipolar type: Principal | ICD-10-CM | POA: Diagnosis present

## 2020-02-21 DIAGNOSIS — Z91199 Patient's noncompliance with other medical treatment and regimen due to unspecified reason: Secondary | ICD-10-CM

## 2020-02-21 DIAGNOSIS — E119 Type 2 diabetes mellitus without complications: Secondary | ICD-10-CM

## 2020-02-21 DIAGNOSIS — E785 Hyperlipidemia, unspecified: Secondary | ICD-10-CM | POA: Diagnosis present

## 2020-02-21 DIAGNOSIS — F17210 Nicotine dependence, cigarettes, uncomplicated: Secondary | ICD-10-CM | POA: Diagnosis present

## 2020-02-21 DIAGNOSIS — Z20822 Contact with and (suspected) exposure to covid-19: Secondary | ICD-10-CM | POA: Diagnosis present

## 2020-02-21 DIAGNOSIS — Z9119 Patient's noncompliance with other medical treatment and regimen: Secondary | ICD-10-CM

## 2020-02-21 DIAGNOSIS — E1165 Type 2 diabetes mellitus with hyperglycemia: Secondary | ICD-10-CM | POA: Diagnosis present

## 2020-02-21 DIAGNOSIS — E039 Hypothyroidism, unspecified: Secondary | ICD-10-CM | POA: Diagnosis present

## 2020-02-21 DIAGNOSIS — R45851 Suicidal ideations: Secondary | ICD-10-CM | POA: Diagnosis present

## 2020-02-21 DIAGNOSIS — R4189 Other symptoms and signs involving cognitive functions and awareness: Secondary | ICD-10-CM | POA: Diagnosis present

## 2020-02-21 DIAGNOSIS — F29 Unspecified psychosis not due to a substance or known physiological condition: Secondary | ICD-10-CM | POA: Diagnosis present

## 2020-02-21 DIAGNOSIS — G4700 Insomnia, unspecified: Secondary | ICD-10-CM | POA: Diagnosis present

## 2020-02-21 LAB — GLUCOSE, CAPILLARY
Glucose-Capillary: 372 mg/dL — ABNORMAL HIGH (ref 70–99)
Glucose-Capillary: 184 mg/dL — ABNORMAL HIGH (ref 70–99)
Glucose-Capillary: 164 mg/dL — ABNORMAL HIGH (ref 70–99)

## 2020-02-21 LAB — SARS CORONAVIRUS 2 BY RT PCR (HOSPITAL ORDER, PERFORMED IN ~~LOC~~ HOSPITAL LAB): SARS Coronavirus 2: NEGATIVE

## 2020-02-21 MED ORDER — DIVALPROEX SODIUM ER 500 MG PO TB24
750.0000 mg | ORAL_TABLET | Freq: Every day | ORAL | Status: DC
  Administered 2020-02-22 – 2020-02-23 (×2): 750 mg via ORAL
  Filled 2020-02-21 (×4): qty 1

## 2020-02-21 MED ORDER — DONEPEZIL HCL 5 MG PO TABS
10.0000 mg | ORAL_TABLET | Freq: Every day | ORAL | Status: DC
  Administered 2020-02-21 – 2020-02-24 (×4): 10 mg via ORAL
  Filled 2020-02-21 (×4): qty 2

## 2020-02-21 MED ORDER — PALIPERIDONE ER 3 MG PO TB24
9.0000 mg | ORAL_TABLET | Freq: Every day | ORAL | Status: DC
  Administered 2020-02-21 – 2020-02-24 (×4): 9 mg via ORAL
  Filled 2020-02-21 (×4): qty 3

## 2020-02-21 MED ORDER — PALIPERIDONE PALMITATE ER 234 MG/1.5ML IM SUSY
234.0000 mg | PREFILLED_SYRINGE | Freq: Once | INTRAMUSCULAR | Status: AC
  Administered 2020-02-21: 234 mg via INTRAMUSCULAR
  Filled 2020-02-21: qty 1.5

## 2020-02-21 MED ORDER — TRAZODONE HCL 50 MG PO TABS
50.0000 mg | ORAL_TABLET | Freq: Every evening | ORAL | Status: DC | PRN
  Administered 2020-02-22: 50 mg via ORAL
  Filled 2020-02-21: qty 1

## 2020-02-21 MED ORDER — LEVOTHYROXINE SODIUM 50 MCG PO TABS
50.0000 ug | ORAL_TABLET | Freq: Every day | ORAL | Status: DC
  Administered 2020-02-21 – 2020-02-24 (×4): 50 ug via ORAL
  Filled 2020-02-21 (×4): qty 1

## 2020-02-21 MED ORDER — GLIPIZIDE ER 5 MG PO TB24
10.0000 mg | ORAL_TABLET | Freq: Every day | ORAL | Status: DC
  Administered 2020-02-22 – 2020-02-24 (×3): 10 mg via ORAL
  Filled 2020-02-21 (×3): qty 2

## 2020-02-21 MED ORDER — GLUCERNA SHAKE PO LIQD
237.0000 mL | Freq: Two times a day (BID) | ORAL | Status: DC
  Administered 2020-02-22 – 2020-02-24 (×5): 237 mL via ORAL

## 2020-02-21 MED ORDER — GLIPIZIDE ER 5 MG PO TB24
5.0000 mg | ORAL_TABLET | Freq: Every day | ORAL | Status: DC
  Administered 2020-02-21: 5 mg via ORAL
  Filled 2020-02-21: qty 1

## 2020-02-21 MED ORDER — LINAGLIPTIN 5 MG PO TABS
5.0000 mg | ORAL_TABLET | Freq: Every day | ORAL | Status: DC
  Administered 2020-02-21 – 2020-02-23 (×3): 5 mg via ORAL
  Filled 2020-02-21 (×3): qty 1

## 2020-02-21 MED ORDER — ADULT MULTIVITAMIN W/MINERALS CH
1.0000 | ORAL_TABLET | Freq: Every day | ORAL | Status: DC
  Administered 2020-02-21 – 2020-02-24 (×4): 1 via ORAL
  Filled 2020-02-21 (×4): qty 1

## 2020-02-21 MED ORDER — ACETAMINOPHEN 325 MG PO TABS: 650.0000 mg | ORAL_TABLET | Freq: Four times a day (QID) | ORAL | Status: DC | PRN

## 2020-02-21 MED ORDER — MAGNESIUM HYDROXIDE 400 MG/5ML PO SUSP
30.0000 mL | Freq: Every day | ORAL | Status: DC | PRN
  Administered 2020-02-21: 30 mL via ORAL
  Filled 2020-02-21: qty 30

## 2020-02-21 MED ORDER — CLONAZEPAM 0.5 MG PO TABS
0.5000 mg | ORAL_TABLET | Freq: Two times a day (BID) | ORAL | Status: DC
  Administered 2020-02-21 – 2020-02-24 (×7): 0.5 mg via ORAL
  Filled 2020-02-21 (×7): qty 1

## 2020-02-21 MED ORDER — SIMVASTATIN 40 MG PO TABS
20.0000 mg | ORAL_TABLET | Freq: Every day | ORAL | Status: DC
  Administered 2020-02-22 – 2020-02-23 (×2): 20 mg via ORAL
  Filled 2020-02-21 (×2): qty 1

## 2020-02-21 MED ORDER — METFORMIN HCL ER 500 MG PO TB24
1000.0000 mg | ORAL_TABLET | Freq: Two times a day (BID) | ORAL | Status: DC
  Administered 2020-02-21 – 2020-02-24 (×7): 1000 mg via ORAL
  Filled 2020-02-21 (×8): qty 2

## 2020-02-21 MED ORDER — INSULIN ASPART 100 UNIT/ML ~~LOC~~ SOLN
0.0000 [IU] | Freq: Three times a day (TID) | SUBCUTANEOUS | Status: DC
  Administered 2020-02-21 (×2): 2 [IU] via SUBCUTANEOUS
  Administered 2020-02-22: 3 [IU] via SUBCUTANEOUS
  Administered 2020-02-22 (×2): 2 [IU] via SUBCUTANEOUS
  Administered 2020-02-23: 3 [IU] via SUBCUTANEOUS
  Administered 2020-02-23: 1 [IU] via SUBCUTANEOUS
  Administered 2020-02-23: 5 [IU] via SUBCUTANEOUS
  Administered 2020-02-24 (×2): 2 [IU] via SUBCUTANEOUS
  Filled 2020-02-21 (×10): qty 1

## 2020-02-21 MED ORDER — ALUM & MAG HYDROXIDE-SIMETH 200-200-20 MG/5ML PO SUSP: 30.0000 mL | ORAL | Status: DC | PRN

## 2020-02-21 MED ORDER — DIVALPROEX SODIUM ER 250 MG PO TB24
250.0000 mg | ORAL_TABLET | Freq: Every morning | ORAL | Status: DC
  Administered 2020-02-21 – 2020-02-23 (×3): 250 mg via ORAL
  Filled 2020-02-21 (×3): qty 1

## 2020-02-21 NOTE — Progress Notes (Signed)
NUTRITION ASSESSMENT  Pt identified as at risk on the Malnutrition Screen Tool  INTERVENTION: Glucerna Shake po BID, each supplement provides 220 kcal and 10 grams of protein  MVI with minerals daily  NUTRITION DIAGNOSIS: Unintentional weight loss related to sub-optimal intake as evidenced by pt report.   Goal: Pt to meet >/= 90% of their estimated nutrition needs.  Monitor:  PO intake  Assessment:  RD working remotely.  59 y.o. male with schizophrenia brought to hospital under IVC filed by sister with past medical history of DM, hypothyroidism, HTN, HLD, and medication noncompliance.   Per chart review, sister took out IVC paperwork on pt secondary to pt missing for one week, believed he had not eaten in that time. Patient reports leaving his apartment due to people continuing to come into his yard. He reports being homeless, walking for about a week without much to eat or drink.   Per chat, weights have trended down 20 lbs (13%) in the past 4 months which is significant for time frame.  UBW: 160-170 lb (per history 2016-2021). Given recent wt trends as well as history of illness, highly suspect malnutrition, however unable to identify at this time. Will provide Glucerna Shake to aid with meeting needs.   Height: Ht Readings from Last 1 Encounters:  02/21/20 5\' 8"  (1.727 m)    Weight: Wt Readings from Last 1 Encounters:  02/21/20 60.3 kg    Weight Hx: Wt Readings from Last 10 Encounters:  02/21/20 60.3 kg  02/20/20 69.4 kg  10/17/19 69.4 kg  10/17/19 72.6 kg  01/05/17 76.7 kg  12/01/16 77.6 kg  07/03/15 72.6 kg  07/03/15 77.1 kg    BMI:  Body mass index is 20.22 kg/m. Pt meets criteria for WNL based on current BMI.  Estimated Nutritional Needs: Kcal: 25-30 kcal/kg Protein: > 1 gram protein/kg Fluid: 1 ml/kcal  Diet Order:  Diet Order            Diet Carb Modified Fluid consistency: Thin; Room service appropriate? Yes  Diet effective now                 Pt is also offered choice of unit snacks mid-morning and mid-afternoon.  Pt is eating as desired.   Lab results and medications reviewed.   13/01/16, RD, LDN Clinical Nutrition After Hours/Weekend Pager # in Amion

## 2020-02-21 NOTE — ED Notes (Signed)
Hourly rounding reveals patient in room. No complaints, stable, in no acute distress. Q15 minute rounds and monitoring via Rover and Officer to continue.   

## 2020-02-21 NOTE — Progress Notes (Signed)
Patient admitted under IVC taken out by sister after he disappeared for a week. Patient's mother used to care for the patient but she has passed away and the sister is unable to manage his care. Patient recently wandered away from home and was missing for one week. When I asked patient where he went and why he left he said, "I was looking for my home". Denies SI, HI and AVH but appears to be internally preoccupied. Recognized one of the staff members from previous admissions. Speech is somewhat loud but patient is cooperative and calm. After being shown to his room he came out and appeared to be looking around. When asked if he needed anything he said "I know where I'm going. It's open" and went to the day room, which was open. Skin search revealed no open wounds or abnormalities. Skin of feet is very dry but intact. Skin search with Susy Frizzle, RN revealed no contraband. Patient requested and received juice upon arrival and went to the day room.

## 2020-02-21 NOTE — Progress Notes (Signed)
D- Patient alert and oriented. Affect/mood is irritable. Denies SI, HI, AVH, and pain. Patient has calmed down though out the day.   A- Scheduled medications administered to patient, per MD orders. Support and encouragement provided.  Routine safety checks conducted every 15 minutes.  Patient informed to notify staff with problems or concerns.  R- No adverse drug reactions noted. Patient contracts for safety at this time. Patient compliant with medications and treatment plan. Patient receptive, calm, and cooperative. Patient interacts well with others on the unit.  Patient remains safe at this time.  Torrie Mayers RN

## 2020-02-21 NOTE — Consult Note (Signed)
[facility] Face-to-Face Psychiatry Consult   Reason for Consult: Psychiatric evaluation Referring Physician: Dr. Fanny Bien Patient Identification: Justin Weeks MRN:  [id number] Principal Diagnosis: <principal problem not specified> Diagnosis:  Active Problems:   Diabetes (HCC)   Noncompliance   Hypothyroidism   Schizoaffective disorder, bipolar type (HCC)   HTN (hypertension)   Dyslipidemia   Tobacco use disorder   Suicidal ideation   Schizoaffective disorder (HCC)   Total Time spent with patient: 30 minutes  Subjective: "I went missing." Justin Weeks is a 59 y.o. male patient presented to [facility] ED via law enforcement under involuntary commitment status (IVC).  Per the ED triage nurse note, the patient came from his home with a complaint of needing psychiatric evaluation and being unable to care for himself.  The patient's sister took out involuntary commitment paperwork on him due to him going to missing for one week and believed he has not eaten in that time.  The patient voice he had been off all of his medications for approximately one month due to the medications has not worked for him.  He voiced, "I did not like how it made me feel."  The patient was seen face-to-face by this provider; the chart was reviewed and consulted with Dr.Quale on 02/20/2020 due to the patient's care. As a result, it was discussed with the EDP that the patient does meet the criteria to be admitted to the psychiatric inpatient unit. On evaluation, the patient is alert and oriented x 2-3, calm, cooperative, and mood-congruent with affect. The patient does appear to be responding to internal stimuli the patient was seen conversing with himself.  The patient is presenting with delusional thinking. The patient denies auditory or visual hallucinations. The patient denies any suicidal, homicidal, or self-harm ideations. Instead, the patient is presenting with psychotic and paranoid behaviors. During an encounter with the  patient, he was unable to answer questions appropriately. Plan: The patient is a safety risk to self and requires psychiatric inpatient admission for stabilization and treatment.  HPI: Per Dr. Fanny Bien:Justin Weeks is a 59 y.o. male here for evaluation for being fed up Patient reports that people keep coming into his yard around his apartment.  Because all sorts of problems but "I am not get a little man" Reports he got fed up with this and decided to leave his apartment.  He reports has been homeless walking about for about a week now.  Not had much at all to eat or drink.  Feels a bit fatigued and dry Denies any desire to hurt himself or anyone else.  However he does seem to relate the people keep coming into his yard attempting to get into his apartment which was prompted him to leave it. He is under involuntary commitment initiated by the Police Department No fevers or chills.  Reports he has been vaccinated against Covid.  Has not been taking his medication for about a week.  Reports that he does not think he has schizophrenia  Past Psychiatric History:  Schizophrenia disorder (HCC) Seizures (HCC)  Risk to Self: Suicidal Ideation: No Suicidal Intent: No Is patient at risk for suicide?: No Suicidal Plan?: No Access to Means: No Intentional Self Injurious Behavior: None Risk to Others: Homicidal Ideation: No Thoughts of Harm to Others: No Current Homicidal Intent: No Current Homicidal Plan: No Access to Homicidal Means: No History of harm to others?: No Assessment of Violence: None Noted Criminal Charges Pending?: No Does patient have a court date: No Prior  Inpatient Therapy: Prior Inpatient Therapy: Yes Prior Outpatient Therapy: Prior Outpatient Therapy: Yes Does patient have an ACCT team?: Unknown Does patient have Intensive In-House Services?  : Unknown Does patient have [facility] services? : Unknown Does patient have P4CC services?: Unknown  Past Medical History:  Past  Medical History:  Diagnosis Date  . Arthritis   . Asthma   . Blood transfusion without reported diagnosis   . Diabetes mellitus without complication (HCC)   . Hypertension   . Hypothyroid 07/03/2015  . Personal history of tobacco use, presenting hazards to health 05/31/2015  . Schizoaffective disorder (HCC)   . Seizures (HCC)    History reviewed. No pertinent surgical history. Family History: No family history on file. Family Psychiatric  History:  Social History:  Social History   Substance and Sexual Activity  Alcohol Use No     Social History   Substance and Sexual Activity  Drug Use Yes  . Types: Cocaine    Social History   Socioeconomic History  . Marital status: Divorced    Spouse name: Not on file  . Number of children: Not on file  . Years of education: Not on file  . Highest education level: Not on file  Occupational History  . Not on file  Tobacco Use  . Smoking status: Current Every Day Smoker    Packs/day: 1.00    Years: 40.00    Pack years: 40.00    Types: Cigarettes  . Smokeless tobacco: Never Used  Vaping Use  . Vaping Use: Unknown  Substance and Sexual Activity  . Alcohol use: No  . Drug use: Yes    Types: Cocaine  . Sexual activity: Yes  Other Topics Concern  . Not on file  Social History Narrative  . Not on file   Social Determinants of Health   Financial Resource Strain:   . Difficulty of Paying Living Expenses:   Food Insecurity:   . Worried About Programme researcher, broadcasting/film/videounning Out of Food in the Last Year:   . Baristaan Out of Food in the Last Year:   Transportation Needs:   . Freight forwarderLack of Transportation (Medical):   Marland Kitchen. Lack of Transportation (Non-Medical):   Physical Activity:   . Days of Exercise per Week:   . Minutes of Exercise per Session:   Stress:   . Feeling of Stress :   Social Connections:   . Frequency of Communication with Friends and Family:   . Frequency of Social Gatherings with Friends and Family:   . Attends Religious Services:   . Active  Member of Clubs or Organizations:   . Attends BankerClub or Organization Meetings:   Marland Kitchen. Marital Status:    Additional Social History:    Allergies:   Allergies  Allergen Reactions  . 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      Labs:  Results for orders placed or performed during the hospital encounter of 02/20/20 (from the past 48 hour(s))  Comprehensive metabolic panel     Status: Abnormal   Collection Time: 02/20/20  3:23 PM  Result Value Ref Range   Sodium 131 (L) 135 - 145 mmol/L   Potassium 3.7 3.5 - 5.1 mmol/L   Chloride 94 (L) 98 - 111 mmol/L   CO2 25 22 - 32 mmol/L   Glucose, Bld 245 (H) 70 - 99 mg/dL    Comment: Glucose reference range applies only to samples taken after  fasting for at least 8 hours.   BUN 24 (H) 8 - 23 mg/dL   Creatinine, Ser 3.54 (H) 0.61 - 1.24 mg/dL   Calcium 9.3 8.9 - 56.2 mg/dL   Total Protein 8.0 6.5 - 8.1 g/dL   Albumin 4.1 3.5 - 5.0 g/dL   AST 53 (H) 15 - 41 U/L   ALT 28 0 - 44 U/L   Alkaline Phosphatase 82 38 - 126 U/L   Total Bilirubin 1.0 0.3 - 1.2 mg/dL   GFR calc non Af Amer 53 (L) >60 mL/min   GFR calc Af Amer >60 >60 mL/min   Anion gap 12 5 - 15    Comment: Performed at [facility], [address] [place], [place] [address]  Ethanol     Status: None   Collection Time: 02/20/20  3:23 PM  Result Value Ref Range   Alcohol, Ethyl (B) <10 <10 mg/dL    Comment: (NOTE) Lowest detectable limit for serum alcohol is 10 mg/dL.  For medical purposes only. Performed at [facility], [address] [place], [place] [address]   Salicylate level     Status: Abnormal   Collection Time: 02/20/20  3:23 PM  Result Value Ref Range   Salicylate Lvl <7.0 (L) 7.0 - 30.0 mg/dL    Comment: Performed at [facility], [address] [place], [place] [address]  Acetaminophen level     Status: Abnormal   Collection Time: 02/20/20   3:23 PM  Result Value Ref Range   Acetaminophen (Tylenol), Serum <10 (L) 10 - 30 ug/mL    Comment: (NOTE) Therapeutic concentrations vary significantly. A range of 10-30 ug/mL  may be an effective concentration for many patients. However, some  are best treated at concentrations outside of this range. Acetaminophen concentrations >150 ug/mL at 4 hours after ingestion  and >50 ug/mL at 12 hours after ingestion are often associated with  toxic reactions.  Performed at [facility], [address] [place], [place] [address]   cbc     Status: Abnormal   Collection Time: 02/20/20  3:23 PM  Result Value Ref Range   WBC 5.3 4.0 - 10.5 K/uL   RBC 4.61 4.22 - 5.81 MIL/uL   Hemoglobin 13.6 13.0 - 17.0 g/dL   HCT 20.3 (L) 39 - 52 %   MCV 83.5 80.0 - 100.0 fL   MCH 29.5 26.0 - 34.0 pg   MCHC 35.3 30.0 - 36.0 g/dL   RDW 55.9 74.1 - 63.8 %   Platelets 237 150 - 400 K/uL   nRBC 0.0 0.0 - 0.2 %    Comment: Performed at [facility], [address], [place], [place] [address]  Urine Drug Screen, Qualitative     Status: None   Collection Time: 02/20/20  3:23 PM  Result Value Ref Range   Tricyclic, Ur Screen NONE DETECTED NONE DETECTED   Amphetamines, Ur Screen NONE DETECTED NONE DETECTED   MDMA (Ecstasy)Ur Screen NONE DETECTED NONE DETECTED   Cocaine Metabolite,Ur Lane NONE DETECTED NONE DETECTED   Opiate, Ur Screen NONE DETECTED NONE DETECTED   Phencyclidine (PCP) Ur S NONE DETECTED NONE DETECTED   Cannabinoid 50 Ng, Ur [place] NONE DETECTED NONE DETECTED   Barbiturates, Ur Screen NONE DETECTED NONE DETECTED   Benzodiazepine, Ur Scrn NONE DETECTED NONE DETECTED   Methadone Scn, Ur NONE DETECTED NONE DETECTED    Comment: (NOTE) Tricyclics + metabolites, urine    Cutoff 1000 ng/mL Amphetamines + metabolites, urine  Cutoff 1000 ng/mL MDMA (Ecstasy), urine              Cutoff 500 ng/mL Cocaine Metabolite, urine          Cutoff 300 ng/mL Opiate + metabolites, urine        Cutoff  300 ng/mL Phencyclidine (PCP), urine         Cutoff 25 ng/mL Cannabinoid, urine                 Cutoff 50 ng/mL Barbiturates + metabolites, urine  Cutoff 200 ng/mL Benzodiazepine, urine              Cutoff 200 ng/mL Methadone, urine                   Cutoff 300 ng/mL  The urine drug screen provides only a preliminary, unconfirmed analytical test result and should not be used for non-medical purposes. Clinical consideration and professional judgment should be applied to any positive drug screen result due to possible interfering substances. A more specific alternate chemical method must be used in order to obtain a confirmed analytical result. Gas chromatography / mass spectrometry (GC/MS) is the preferred confirm atory method. Performed at [facility], [address] [place], [place] [address]   Glucose, capillary     Status: Abnormal   Collection Time: 02/20/20  5:52 PM  Result Value Ref Range   Glucose-Capillary 157 (H) 70 - 99 mg/dL    Comment: Glucose reference range applies only to samples taken after fasting for at least 8 hours.  Glucose, capillary     Status: Abnormal   Collection Time: 02/20/20  8:02 PM  Result Value Ref Range   Glucose-Capillary 254 (H) 70 - 99 mg/dL    Comment: Glucose reference range applies only to samples taken after fasting for at least 8 hours.    Current Facility-Administered Medications  Medication Dose Route Frequency Provider Last Rate Last Admin  . divalproex (DEPAKOTE ER) 24 hr tablet 250 mg  250 mg Oral q morning - 10a Delman Kitten, MD      . divalproex (DEPAKOTE ER) 24 hr tablet 750 mg  750 mg Oral QHS Delman Kitten, MD   750 mg at 02/20/20 2141  . donepezil (ARICEPT) tablet 10 mg  10 mg Oral Daily Delman Kitten, MD      . glipiZIDE (GLUCOTROL XL) 24 hr tablet 5 mg  5 mg Oral Q breakfast Delman Kitten, MD      . levothyroxine (SYNTHROID) tablet 50 mcg  50 mcg Oral Daily Delman Kitten, MD      . linagliptin (TRADJENTA) tablet 5 mg  5 mg  Oral Daily Delman Kitten, MD      . metFORMIN (GLUCOPHAGE-XR) 24 hr tablet 1,000 mg  1,000 mg Oral BID WC Delman Kitten, MD      . simvastatin (ZOCOR) tablet 20 mg  20 mg Oral QHS Delman Kitten, MD   20 mg at 02/20/20 2144  . sodium chloride 0.9 % bolus 500 mL  500 mL Intravenous Once Delman Kitten, MD       Current Outpatient Medications  Medication Sig Dispense Refill  . clonazePAM (KLONOPIN) 0.5 MG tablet Take 0.5 mg by mouth 2 (two) times daily.     . divalproex (DEPAKOTE ER) 250 MG 24 hr tablet Take 1 tablet (250 mg total) by mouth every morning. 30 tablet 1  . divalproex (DEPAKOTE ER) 250 MG 24 hr tablet Take 3 tablets (750 mg total) by mouth  at bedtime. 90 tablet 1  . donepezil (ARICEPT) 10 MG tablet Take 10 mg by mouth daily.    Marland Kitchen glipiZIDE (GLUCOTROL XL) 10 MG 24 hr tablet Take 10 mg by mouth 2 (two) times daily.    . INVEGA 9 MG 24 hr tablet Take 1 tablet (9 mg total) by mouth every morning. 30 tablet 1  . levothyroxine (SYNTHROID, LEVOTHROID) 50 MCG tablet Take 50 mcg by mouth daily.     . metFORMIN (FORTAMET) 1000 MG (OSM) 24 hr tablet Take 1 tablet (1,000 mg total) by mouth 2 (two) times daily with a meal. 60 tablet 1  . simvastatin (ZOCOR) 20 MG tablet Take 20 mg by mouth at bedtime.     . sitaGLIPtin (JANUVIA) 100 MG tablet Take 100 mg by mouth daily.    . VENTOLIN HFA 108 (90 Base) MCG/ACT inhaler Inhale 2 puffs into the lungs every 4 (four) hours as needed.      Musculoskeletal: Strength & Muscle Tone: within normal limits Gait & Station: unsteady Patient leans: N/A  Psychiatric Specialty Exam: Physical Exam  Constitutional: He is cooperative.  Psychiatric: His affect is inappropriate. His speech is tangential. Thought content is paranoid and delusional. He expresses impulsivity. He has a flat affect. He is inattentive.    Review of Systems  Psychiatric/Behavioral: Positive for confusion, hallucinations and sleep disturbance. The patient is nervous/anxious.   All other  systems reviewed and are negative.   Blood pressure 121/85, pulse 95, temperature 98.7 F (37.1 C), temperature source Oral, resp. rate 18, height 5\' 8"  (1.727 m), weight 69.4 kg, SpO2 98 %.Body mass index is 23.26 kg/m.  General Appearance: Disheveled  Eye Contact:  Good  Speech:  Garbled and Normal Rate  Volume:  Normal  Mood:  Anxious and Irritable  Affect:  Blunt, Depressed, Flat and Inappropriate  Thought Process:  Goal Directed  Orientation:  Other:  x 2  Thought Content:  Delusions, Paranoid Ideation, Rumination and Tangential  Suicidal Thoughts:  No  Homicidal Thoughts:  No  Memory:  Immediate;   Fair Recent;   Fair Remote;   Fair  Judgement:  Impaired  Insight:  Lacking  Psychomotor Activity:  Increased  Concentration:  Concentration: Fair and Attention Span: Fair  Recall:  Poor  Fund of Knowledge:  Poor  Language:  Poor  Akathisia:  Negative  Handed:  Right  AIMS (if indicated):     Assets:  Communication Skills Desire for Improvement Financial Resources/Insurance Housing Physical Health Resilience Social Support  ADL's:  Intact  Cognition:  Impaired,  Mild  Sleep:    Insomnia     Treatment Plan Summary: Plan Patient meets criteria for psychiatric inpatient admission.  Disposition: Recommend psychiatric Inpatient admission when medically cleared. Supportive therapy provided about ongoing stressors.  , NP 02/21/2020 1:08 AM

## 2020-02-21 NOTE — Progress Notes (Signed)
   02/21/20 1400  Clinical Encounter Type  Visited With Patient  Visit Type Initial;Spiritual support;Social support;Behavioral Health  Referral From Chaplain  Consult/Referral To Chaplain  Ch visited Pt in [place]. Pt attended Ch group. Today we talked about forgiveness. Pt participated in the discussion. Pt left group early. Ch will follow-up with Pt.

## 2020-02-21 NOTE — ED Notes (Addendum)
Patient has been admitted to bed 303 in the BMU.  The following people are aware of patients transfer   Accepting Physician: Clapacs ED RN: Riki Altes RN: Britta Mccreedy ED NP: Annice Pih ED- Registration ED Secretary-Brenda

## 2020-02-21 NOTE — [facility] Group Notes (Signed)
LCSW Group Therapy Note  02/21/2020 2:45 PM  Type of Therapy/Topic:  Group Therapy:  Feelings about Diagnosis  Participation Level:  Did Not Attend   Description of Group:   This group will allow patients to explore their thoughts and feelings about diagnoses they have received. Patients will be guided to explore their level of understanding and acceptance of these diagnoses. Facilitator will encourage patients to process their thoughts and feelings about the reactions of others to their diagnosis and will guide patients in identifying ways to discuss their diagnosis with significant others in their lives. This group will be process-oriented, with patients participating in exploration of their own experiences, giving and receiving support, and processing challenge from other group members.   Therapeutic Goals: 1. Patient will demonstrate understanding of diagnosis as evidenced by identifying two or more symptoms of the disorder 2. Patient will be able to express two feelings regarding the diagnosis 3. Patient will demonstrate their ability to communicate their needs through discussion and/or role play  Summary of Patient Progress: X  Therapeutic Modalities:   Cognitive Behavioral Therapy Brief Therapy Feelings Identification   Penni Homans, MSW, LCSW 02/21/2020 2:45 PM

## 2020-02-21 NOTE — H&P (Signed)
Psychiatric Admission Assessment Adult  Patient Identification: Justin Weeks MRN:  [id number] Date of Evaluation:  02/21/2020 Chief Complaint:  Schizophrenia (HCC) [F20.9] Principal Diagnosis: Schizophrenia (HCC) Diagnosis:  Principal Problem:   Schizophrenia (HCC) Active Problems:   Diabetes (HCC)   Noncompliance   Hypothyroidism   HTN (hypertension)   Dyslipidemia  History of Present Illness: Patient seen and chart reviewed.  59 year old man with schizophrenia brought to the hospital under IVC filed by sister.  Patient had left his apartment and been staying outdoors not taking care of himself for at least a week.  Seems to have been off all of his medicine during that time.  Not clear when he last had any of his psych medicine.  When he got back to his apartment sounds like he was very disorganized and agitated.  Sister reports he has been going downhill of late.  Patient is disorganized and psychotic and not a good historian.  Admits to being off his medicine.  Insight is quite poor.  Denies any current hallucinations denies suicidal or homicidal ideation.  Denies that he has been drinking or using any drugs.  Patient's blood sugar is elevated on admission. Associated Signs/Symptoms: Depression Symptoms:  insomnia, psychomotor agitation, anxiety, (Hypo) Manic Symptoms:  Flight of Ideas, Irritable Mood, Labiality of Mood, Anxiety Symptoms:  Nothing specific reported Psychotic Symptoms:  Paranoia, Patient is paranoid and disorganized in his thinking PTSD Symptoms: Negative Total Time spent with patient: 1 hour  Past Psychiatric History: Long history of chronic psychotic illness going back decades.  Multiple prior hospitalizations.  Has been stabilized for the most part recently on Depakote and Invega long-acting injections.  [address] act team follows up with him.  No clear past history of suicidal behavior.  Does get agitated and paranoid off of his medicine.  Does not appear  to have active substance use issues.  Is the patient at risk to self? Yes.    Has the patient been a risk to self in the past 6 months? Yes.    Has the patient been a risk to self within the distant past? Yes.    Is the patient a risk to others? No.  Has the patient been a risk to others in the past 6 months? No.  Has the patient been a risk to others within the distant past? No.   Prior Inpatient Therapy:   Prior Outpatient Therapy:    Alcohol Screening: 1. How often do you have a drink containing alcohol?: Never 2. How many drinks containing alcohol do you have on a typical day when you are drinking?: 1 or 2 3. How often do you have six or more drinks on one occasion?: Never AUDIT-C Score: 0 4. How often during the last year have you found that you were not able to stop drinking once you had started?: Never 5. How often during the last year have you failed to do what was normally expected from you because of drinking?: Never 6. How often during the last year have you needed a first drink in the morning to get yourself going after a heavy drinking session?: Never 7. How often during the last year have you had a feeling of guilt of remorse after drinking?: Never 8. How often during the last year have you been unable to remember what happened the night before because you had been drinking?: Never 9. Have you or someone else been injured as a result of your drinking?: No 10. Has a relative or  friend or a doctor or another health worker been concerned about your drinking or suggested you cut down?: No Alcohol Use Disorder Identification Test Final Score (AUDIT): 0 Alcohol Brief Interventions/Follow-up: Brief Advice Substance Abuse History in the last 12 months:  No. Consequences of Substance Abuse: Negative Previous Psychotropic Medications: Yes  Psychological Evaluations: Yes  Past Medical History:  Past Medical History:  Diagnosis Date  . Arthritis   . Asthma   . Blood transfusion  without reported diagnosis   . Diabetes mellitus without complication ([place])   . Hypertension   . Hypothyroid 07/03/2015  . Personal history of tobacco use, presenting hazards to health 05/31/2015  . Schizoaffective disorder ([place])   . Seizures ([place])    History reviewed. No pertinent surgical history. Family History: History reviewed. No pertinent family history. Family Psychiatric  History: None reported Tobacco Screening: Have you used any form of tobacco in the last 30 days? (Cigarettes, Smokeless Tobacco, Cigars, and/or Pipes): Yes Tobacco use, Select all that apply: 5 or more cigarettes per day Are you interested in Tobacco Cessation Medications?: Yes, will notify MD for an order Counseled patient on smoking cessation including recognizing danger situations, developing coping skills and basic information about quitting provided: Yes Social History:  Social History   Substance and Sexual Activity  Alcohol Use No     Social History   Substance and Sexual Activity  Drug Use Yes  . Types: Cocaine    Additional Social History:                           Allergies:   Allergies  Allergen Reactions  . 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     Lab Results:  Results for orders placed or performed during the hospital encounter of 02/21/20 (from the past 48 hour(s))  Glucose, capillary     Status: Abnormal   Collection Time: 02/21/20  7:04 AM  Result Value Ref Range   Glucose-Capillary 372 (H) 70 - 99 mg/dL    Comment: Glucose reference range applies only to samples taken after fasting for at least 8 hours.    Blood Alcohol level:  Lab Results  Component Value Date   ETH <10 02/20/2020   ETH <10 20/25/4270    Metabolic Disorder Labs:  Lab Results  Component Value Date   HGBA1C 6.5 (H) 07/04/2015   Lab Results  Component Value Date   PROLACTIN 21.9 (H) 10/18/2019    Lab Results  Component Value Date   CHOL 113 07/04/2015   TRIG 118 07/04/2015   HDL 30 (L) 07/04/2015   CHOLHDL 3.8 07/04/2015   VLDL 24 07/04/2015   LDLCALC 59 07/04/2015   LDLCALC 41 09/21/2014    Current Medications: Current Facility-Administered Medications  Medication Dose Route Frequency Provider Last Rate Last Admin  . acetaminophen (TYLENOL) tablet 650 mg  650 mg Oral Q6H PRN Caroline Sauger, NP      . alum & mag hydroxide-simeth (MAALOX/MYLANTA) 200-200-20 MG/5ML suspension 30 mL  30 mL Oral Q4H PRN Caroline Sauger, NP      . clonazePAM Bobbye Charleston) tablet 0.5 mg  0.5 mg Oral BID Sheran Newstrom T, MD      . divalproex (DEPAKOTE ER) 24 hr tablet 250 mg  250 mg Oral q morning - 10a Caroline Sauger, NP   250 mg at 02/21/20 0835  . divalproex (DEPAKOTE ER)  24 hr tablet 750 mg  750 mg Oral QHS Gillermo Murdoch, NP      . donepezil (ARICEPT) tablet 10 mg  10 mg Oral Daily Gillermo Murdoch, NP   10 mg at 02/21/20 0835  . [START ON 02/22/2020] glipiZIDE (GLUCOTROL XL) 24 hr tablet 10 mg  10 mg Oral Q breakfast Malynda Smolinski T, MD      . insulin aspart (novoLOG) injection 0-9 Units  0-9 Units Subcutaneous TID WC Krisinda Giovanni T, MD      . levothyroxine (SYNTHROID) tablet 50 mcg  50 mcg Oral Q0600 Gillermo Murdoch, NP   50 mcg at 02/21/20 [phone]  . linagliptin (TRADJENTA) tablet 5 mg  5 mg Oral Daily Gillermo Murdoch, NP   5 mg at 02/21/20 0834  . magnesium hydroxide (MILK OF MAGNESIA) suspension 30 mL  30 mL Oral Daily PRN Gillermo Murdoch, NP      . metFORMIN (GLUCOPHAGE-XR) 24 hr tablet 1,000 mg  1,000 mg Oral BID WC Gillermo Murdoch, NP      . paliperidone (INVEGA SUSTENNA) injection 234 mg  234 mg Intramuscular Once Tomica Arseneault T, MD      . paliperidone (INVEGA) 24 hr tablet 9 mg  9 mg Oral Daily Tierra Thoma T, MD      . simvastatin (ZOCOR) tablet 20 mg  20 mg Oral QHS Gillermo Murdoch, NP      . traZODone (DESYREL) tablet 50 mg  50 mg  Oral QHS PRN Gillermo Murdoch, NP       PTA Medications: Medications Prior to Admission  Medication Sig Dispense Refill Last Dose  . clonazePAM (KLONOPIN) 0.5 MG tablet Take 0.5 mg by mouth 2 (two) times daily.      . divalproex (DEPAKOTE ER) 250 MG 24 hr tablet Take 1 tablet (250 mg total) by mouth every morning. 30 tablet 1   . divalproex (DEPAKOTE ER) 250 MG 24 hr tablet Take 3 tablets (750 mg total) by mouth at bedtime. 90 tablet 1   . donepezil (ARICEPT) 10 MG tablet Take 10 mg by mouth daily.     Marland Kitchen glipiZIDE (GLUCOTROL XL) 10 MG 24 hr tablet Take 10 mg by mouth 2 (two) times daily.     . INVEGA 9 MG 24 hr tablet Take 1 tablet (9 mg total) by mouth every morning. 30 tablet 1   . levothyroxine (SYNTHROID, LEVOTHROID) 50 MCG tablet Take 50 mcg by mouth daily.      . metFORMIN (FORTAMET) 1000 MG (OSM) 24 hr tablet Take 1 tablet (1,000 mg total) by mouth 2 (two) times daily with a meal. 60 tablet 1   . simvastatin (ZOCOR) 20 MG tablet Take 20 mg by mouth at bedtime.      . sitaGLIPtin (JANUVIA) 100 MG tablet Take 100 mg by mouth daily.     . VENTOLIN HFA 108 (90 Base) MCG/ACT inhaler Inhale 2 puffs into the lungs every 4 (four) hours as needed.       Musculoskeletal: Strength & Muscle Tone: within normal limits Gait & Station: normal Patient leans: N/A  Psychiatric Specialty Exam: Physical Exam  Nursing note and vitals reviewed. Constitutional: He appears well-developed.  HENT:  Head: Normocephalic and atraumatic.  Eyes: Pupils are equal, round, and reactive to light. Conjunctivae are normal.  Cardiovascular: Normal heart sounds.  Respiratory: Effort normal.  GI: Soft.  Musculoskeletal:        General: Normal range of motion.     Cervical back: Normal range of motion.  Neurological:  He is alert.  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  Psychiatric: His affect is angry, labile and inappropriate. His speech is rapid and/or  pressured and tangential. He is agitated. Thought content is paranoid and delusional. Cognition and memory are impaired. He expresses inappropriate judgment. He expresses no homicidal and no suicidal ideation. He is inattentive.    Review of Systems  Constitutional: Negative.   HENT: Negative.   Eyes: Negative.   Respiratory: Negative.   Cardiovascular: Negative.   Gastrointestinal: Negative.   Musculoskeletal: Negative.   Skin: Negative.   Neurological: Negative.   Psychiatric/Behavioral: Positive for dysphoric mood.    Blood pressure 132/86, pulse 78, temperature 98 F (36.7 C), temperature source Oral, resp. rate 16, height 5\' 8"  (1.727 m), weight 60.3 kg, SpO2 100 %.Body mass index is 20.22 kg/m.  General Appearance: Disheveled  Eye Contact:  Minimal  Speech:  Pressured  Volume:  Increased  Mood:  Angry and Irritable  Affect:  Inappropriate and Labile  Thought Process:  Disorganized  Orientation:  Negative  Thought Content:  Illogical, Paranoid Ideation, Rumination and Tangential  Suicidal Thoughts:  No  Homicidal Thoughts:  No  Memory:  Immediate;   Fair Recent;   Poor Remote;   Poor  Judgement:  Impaired  Insight:  Shallow  Psychomotor Activity:  TD  Concentration:  Concentration: Poor  Recall:  of Knowledge:  Fair  Language:  Fair  Akathisia:  No  Handed:  Right  AIMS (if indicated):     Assets:  Financial Resources/Insurance Housing Resilience Social Support  ADL's:  Impaired  Cognition:  Impaired,  Mild  Sleep:  Number of Hours: 0    Treatment Plan Summary: Daily contact with patient to assess and evaluate symptoms and progress in treatment, Medication management and Plan On the plus side the patient has financial support act team support family involvement.  On the other hand he has significant medical problems that he is not caring for and a history of noncompliance.  He will be restarted on Depakote and Invega with a plan to get him back on  his injection.  We will start him back on his diabetes medicine and have sliding scale until it can be stabilized.  Individual and group therapy and ongoing assessment.  Contact already initiated with act team.  Observation Level/Precautions:  15 minute checks  Laboratory:  HbAIC  Psychotherapy:    Medications:    Consultations:    Discharge Concerns:    Estimated LOS:  Other:     Physician Treatment Plan for Primary Diagnosis: Schizophrenia (HCC) Long Term Goal(s): Improvement in symptoms so as ready for discharge  Short Term Goals: Ability to demonstrate self-control will improve and Ability to identify and develop effective coping behaviors will improve  Physician Treatment Plan for Secondary Diagnosis: Principal Problem:   Schizophrenia (HCC) Active Problems:   Diabetes (HCC)   Noncompliance   Hypothyroidism   HTN (hypertension)   Dyslipidemia  Long Term Goal(s): Improvement in symptoms so as ready for discharge  Short Term Goals: Ability to maintain clinical measurements within normal limits will improve and Compliance with prescribed medications will improve  I certify that inpatient services furnished can reasonably be expected to improve the patient's condition.    [employer], MD 6/22/202110:14 AM

## 2020-02-21 NOTE — ED Notes (Signed)
Pt is been transferred to BMU, he is clam and cooperative with acute distress assessed or reported. Report given to [facility] RN

## 2020-02-21 NOTE — ED Notes (Signed)
Hourly rounding reveals patient in room. No complaints, stable, in no acute distress. Q15 minute rounds and monitoring via Psychologist, counselling to continue.  Pt is been transferred to [facility]

## 2020-02-21 NOTE — Tx Team (Addendum)
Interdisciplinary Treatment and Diagnostic Plan Update  02/21/2020 Time of Session: 9am Justin Weeks MRN: [id number]  Principal Diagnosis: Schizophrenia [facility])  Secondary Diagnoses: Principal Problem:   Schizophrenia ([place]) Active Problems:   Diabetes ([place])   Noncompliance   Hypothyroidism   HTN (hypertension)   Dyslipidemia   Current Medications:  Current Facility-Administered Medications  Medication Dose Route Frequency Provider Last Rate Last Admin  . acetaminophen (TYLENOL) tablet 650 mg  650 mg Oral Q6H PRN Caroline Sauger, NP      . alum & mag hydroxide-simeth (MAALOX/MYLANTA) 200-200-20 MG/5ML suspension 30 mL  30 mL Oral Q4H PRN Caroline Sauger, NP      . clonazePAM Bobbye [place]) tablet 0.5 mg  0.5 mg Oral BID Clapacs, Madie [place], MD   0.5 mg at 02/21/20 1111  . divalproex (DEPAKOTE ER) 24 hr tablet 250 mg  250 mg Oral q morning - 10a Caroline Sauger, NP   250 mg at 02/21/20 0835  . divalproex (DEPAKOTE ER) 24 hr tablet 750 mg  750 mg Oral QHS Caroline Sauger, NP      . donepezil (ARICEPT) tablet 10 mg  10 mg Oral Daily Caroline Sauger, NP   10 mg at 02/21/20 0835  . [START ON 02/22/2020] glipiZIDE (GLUCOTROL XL) 24 hr tablet 10 mg  10 mg Oral Q breakfast Clapacs, John T, MD      . insulin aspart (novoLOG) injection 0-9 Units  0-9 Units Subcutaneous TID WC Clapacs, John T, MD      . levothyroxine (SYNTHROID) tablet 50 mcg  50 mcg Oral Q0600 Caroline Sauger, NP   50 mcg at 02/21/20 [phone]  . linagliptin (TRADJENTA) tablet 5 mg  5 mg Oral Daily Caroline Sauger, NP   5 mg at 02/21/20 0834  . magnesium hydroxide (MILK OF MAGNESIA) suspension 30 mL  30 mL Oral Daily PRN Caroline Sauger, NP      . metFORMIN (GLUCOPHAGE-XR) 24 hr tablet 1,000 mg  1,000 mg Oral BID WC Caroline Sauger, NP      . paliperidone (INVEGA SUSTENNA) injection 234 mg  234 mg Intramuscular Once Clapacs, John T, MD      . paliperidone (INVEGA) 24 hr tablet 9 mg  9 mg Oral  Daily Clapacs, John T, MD      . simvastatin (ZOCOR) tablet 20 mg  20 mg Oral QHS Caroline Sauger, NP      . traZODone (DESYREL) tablet 50 mg  50 mg Oral QHS PRN Caroline Sauger, NP       PTA Medications: Medications Prior to Admission  Medication Sig Dispense Refill Last Dose  . clonazePAM (KLONOPIN) 0.5 MG tablet Take 0.5 mg by mouth 2 (two) times daily.      . divalproex (DEPAKOTE ER) 250 MG 24 hr tablet Take 1 tablet (250 mg total) by mouth every morning. 30 tablet 1   . divalproex (DEPAKOTE ER) 250 MG 24 hr tablet Take 3 tablets (750 mg total) by mouth at bedtime. 90 tablet 1   . donepezil (ARICEPT) 10 MG tablet Take 10 mg by mouth daily.     Marland Kitchen glipiZIDE (GLUCOTROL XL) 10 MG 24 hr tablet Take 10 mg by mouth 2 (two) times daily.     . INVEGA 9 MG 24 hr tablet Take 1 tablet (9 mg total) by mouth every morning. 30 tablet 1   . levothyroxine (SYNTHROID, LEVOTHROID) 50 MCG tablet Take 50 mcg by mouth daily.      . metFORMIN (FORTAMET) 1000 MG (OSM) 24 hr tablet  Take 1 tablet (1,000 mg total) by mouth 2 (two) times daily with a meal. 60 tablet 1   . simvastatin (ZOCOR) 20 MG tablet Take 20 mg by mouth at bedtime.      . sitaGLIPtin (JANUVIA) 100 MG tablet Take 100 mg by mouth daily.     . VENTOLIN HFA 108 (90 Base) MCG/ACT inhaler Inhale 2 puffs into the lungs every 4 (four) hours as needed.       Patient Stressors: Marital or family conflict Medication change or noncompliance  Patient Strengths: Active sense of humor Physical Health Supportive family/friends  Treatment Modalities: Medication Management, Group therapy, Case management,  1 to 1 session with clinician, Psychoeducation, Recreational therapy.   Physician Treatment Plan for Primary Diagnosis: Schizophrenia ([place]) Long Term Goal(s): Improvement in symptoms so as ready for discharge Improvement in symptoms so as ready for discharge   Short Term Goals: Ability to demonstrate self-control will improve Ability to  identify and develop effective coping behaviors will improve Ability to maintain clinical measurements within normal limits will improve Compliance with prescribed medications will improve  Medication Management: Evaluate patient's response, side effects, and tolerance of medication regimen.  Therapeutic Interventions: 1 to 1 sessions, Unit Group sessions and Medication administration.  Evaluation of Outcomes: Not Met  Physician Treatment Plan for Secondary Diagnosis: Principal Problem:   Schizophrenia ([place]) Active Problems:   Diabetes ([place])   Noncompliance   Hypothyroidism   HTN (hypertension)   Dyslipidemia  Long Term Goal(s): Improvement in symptoms so as ready for discharge Improvement in symptoms so as ready for discharge   Short Term Goals: Ability to demonstrate self-control will improve Ability to identify and develop effective coping behaviors will improve Ability to maintain clinical measurements within normal limits will improve Compliance with prescribed medications will improve     Medication Management: Evaluate patient's response, side effects, and tolerance of medication regimen.  Therapeutic Interventions: 1 to 1 sessions, Unit Group sessions and Medication administration.  Evaluation of Outcomes: Not Met   RN Treatment Plan for Primary Diagnosis: Schizophrenia ([place]) Long Term Goal(s): Knowledge of disease and therapeutic regimen to maintain health will improve  Short Term Goals: Ability to demonstrate self-control, Ability to participate in decision making will improve, Ability to verbalize feelings will improve, Ability to identify and develop effective coping behaviors will improve and Compliance with prescribed medications will improve  Medication Management: RN will administer medications as ordered by provider, will assess and evaluate patient's response and provide education to patient for prescribed medication. RN will report any adverse and/or side  effects to prescribing provider.  Therapeutic Interventions: 1 on 1 counseling sessions, Psychoeducation, Medication administration, Evaluate responses to treatment, Monitor vital signs and CBGs as ordered, Perform/monitor CIWA, COWS, AIMS and Fall Risk screenings as ordered, Perform wound care treatments as ordered.  Evaluation of Outcomes: Not Met   LCSW Treatment Plan for Primary Diagnosis: Schizophrenia ([place]) Long Term Goal(s): Safe transition to appropriate next level of care at discharge, Engage patient in therapeutic group addressing interpersonal concerns.  Short Term Goals: Engage patient in aftercare planning with referrals and resources  Therapeutic Interventions: Assess for all discharge needs, 1 to 1 time with Social worker, Explore available resources and support systems, Assess for adequacy in community support network, Educate family and significant other(s) on suicide prevention, Complete Psychosocial Assessment, Interpersonal group therapy.  Evaluation of Outcomes: Not Met   Progress in Treatment: Attending groups: No. Participating in groups: No. Taking medication as prescribed: Yes. Toleration medication: Yes. Family/Significant  other contact made: No, will contact:  when pt gives consent Patient understands diagnosis: No. Discussing patient identified problems/goals with staff: Yes. Medical problems stabilized or resolved: No. Denies suicidal/homicidal ideation: Yes. Issues/concerns per patient self-inventory: No. Other: NA  New problem(s) identified: No, Describe:  None reported  New Short Term/Long Term Goal(s):Attend outpatient treatment, take medication as prescribed, develop and implement healthy coping methods  Patient Goals:  "Be out of here by Thursday"  Discharge Plan or Barriers: Pt reports being followed by [facility] ACT Team. D/C plan TBD.  Reason for Continuation of Hospitalization: Medication stabilization  Estimated Length of Stay:1-7  days    Recreational Therapy: Patient: N/A Patient Goal: Patient will engage in groups without prompting or encouragement from LRT x3 group sessions within 5 recreation therapy group sessions.  Attendees: Patient:Justin Weeks 02/21/2020 11:45 AM  Physician: Alethia Berthold 02/21/2020 11:45 AM  Nursing: Collier Bullock 02/21/2020 11:45 AM  RN Care Manager: 02/21/2020 11:45 AM  Social Worker: Anise Salvo 02/21/2020 11:45 AM  Recreational Therapist: Roanna Epley 02/21/2020 11:45 AM  Other:  02/21/2020 11:45 AM  Other:  02/21/2020 11:45 AM  Other: 02/21/2020 11:45 AM    Scribe for Treatment Team: Yvette Rack, LCSW 02/21/2020 11:45 AM

## 2020-02-21 NOTE — Progress Notes (Signed)
Recreation Therapy Notes  Date: 02/21/2020  Time: 9:30 am  Location: Craft room   Behavioral response: Redirection needed  Intervention Topic: Strengths   Discussion/Intervention:  Group content today was focused on strengths. The group identified some of the strengths they have. Individuals stated reason why they do not use their strengths. Patients expressed what strengths others see in them. The group identified important reason to use their strengths. The group participated in the intervention "Picking strengths", where they had a chance to identify some of their strengths. Clinical Observations/Feedback:  Patient came to group late due to unknown reasons and was disruptive. Participant needed redirection to focus on task and follow directions given. He stated that a strength he wishes he had is money. Individual was social with peers and staff while participating on the intervention.  Justin Weeks LRT/CTRS         Justin Weeks 02/21/2020 12:53 PM

## 2020-02-21 NOTE — Progress Notes (Signed)
MD was texted at 0735 and called at 80. Spoke with Dr. Toni Amend at [phone] concerning blood sugar of 372 with no insulin coverage. No new orders until Dr.Clpacs comes in to assess. He is on his way. Pt Justin Weeks up walking and talking with no complaints. Torrie Mayers RN

## 2020-02-21 NOTE — ED Notes (Signed)
Pt ambulated to bathroom 

## 2020-02-21 NOTE — Plan of Care (Signed)
Pt denies depression, anxiety, SI, HI and AVH. Pt was educated on care plan and verbalizes understanding. Pt was encouraged to attend groups. Torrie Mayers RN Problem: Education: Goal: Knowledge of [facility] Education information/materials will improve Outcome: Progressing Goal: Emotional status will improve Outcome: Progressing Goal: Mental status will improve Outcome: Progressing Goal: Verbalization of understanding the information provided will improve Outcome: Progressing   Problem: Activity: Goal: Interest or engagement in activities will improve Outcome: Progressing Goal: Sleeping patterns will improve Outcome: Progressing   Problem: Coping: Goal: Ability to verbalize frustrations and anger appropriately will improve Outcome: Progressing Goal: Ability to demonstrate self-control will improve Outcome: Progressing   Problem: Health Behavior/Discharge Planning: Goal: Identification of resources available to assist in meeting health care needs will improve Outcome: Progressing Goal: Compliance with treatment plan for underlying cause of condition will improve Outcome: Progressing   Problem: Safety: Goal: Periods of time without injury will increase Outcome: Progressing   Problem: Activity: Goal: Will verbalize the importance of balancing activity with adequate rest periods Outcome: Progressing   Problem: Education: Goal: Will be free of psychotic symptoms Outcome: Progressing Goal: Knowledge of the prescribed therapeutic regimen will improve Outcome: Progressing   Problem: Coping: Goal: Coping ability will improve Outcome: Progressing Goal: Will verbalize feelings Outcome: Progressing   Problem: Health Behavior/Discharge Planning: Goal: Compliance with prescribed medication regimen will improve Outcome: Progressing   Problem: Nutritional: Goal: Ability to achieve adequate nutritional intake will improve Outcome: Progressing   Problem: Role  Relationship: Goal: Ability to communicate needs accurately will improve Outcome: Progressing Goal: Ability to interact with others will improve Outcome: Progressing   Problem: Safety: Goal: Ability to redirect hostility and anger into socially appropriate behaviors will improve Outcome: Progressing Goal: Ability to remain free from injury will improve Outcome: Progressing   Problem: Self-Care: Goal: Ability to participate in self-care as condition permits will improve Outcome: Progressing   Problem: Self-Concept: Goal: Will verbalize positive feelings about self Outcome: Progressing

## 2020-02-21 NOTE — [facility] Suicide Risk Assessment (Signed)
[facility] Admission Suicide Risk Assessment   Nursing information obtained from:  Review of record Demographic factors:  Male, Unemployed, Low socioeconomic status Current Mental Status:  NA Loss Factors:  Loss of significant relationship Historical Factors:  NA Risk Reduction Factors:  Positive social support  Total Time spent with patient: 1 hour Principal Problem: Schizophrenia ([place]) Diagnosis:  Principal Problem:   Schizophrenia ([place]) Active Problems:   Diabetes ([place])   Noncompliance   Hypothyroidism   HTN (hypertension)   Dyslipidemia  Subjective Data: Patient with a history of chronic mental health problems brought to the hospital under IVC initiated by sister.  Allegations of poor self-care psychosis agitated behavior in public.  No report of suicidal threats.  Patient does not report any suicidal ideation currently.  Continued Clinical Symptoms:  Alcohol Use Disorder Identification Test Final Score (AUDIT): 0 The "Alcohol Use Disorders Identification Test", Guidelines for Use in Primary Care, Second Edition.  World Pharmacologist [facility]). Score between 0-7:  no or low risk or alcohol related problems. Score between 8-15:  moderate risk of alcohol related problems. Score between 16-19:  high risk of alcohol related problems. Score 20 or above:  warrants further diagnostic evaluation for alcohol dependence and treatment.   CLINICAL FACTORS:   Schizophrenia:   Paranoid or undifferentiated type   Musculoskeletal: Strength & Muscle Tone: within normal limits Gait & Station: normal Patient leans: N/A  Psychiatric Specialty Exam: Physical Exam  Nursing note and vitals reviewed. Constitutional: He appears well-developed.  HENT:  Head: Normocephalic and atraumatic.  Eyes: Pupils are equal, round, and reactive to light. Conjunctivae are normal.  Cardiovascular: Normal heart sounds.  Respiratory: Effort normal.  GI: Soft.  Musculoskeletal:        General: Normal range of  motion.     Cervical back: Normal range of motion.  Neurological: He is alert.  Mild TD  Skin: Skin is warm and dry.  Psychiatric: His affect is labile and inappropriate. His speech is tangential. He is agitated. He is not aggressive. Thought content is paranoid. He expresses inappropriate judgment. He is inattentive.    Review of Systems  Constitutional: Negative.   HENT: Negative.   Eyes: Negative.   Respiratory: Negative.   Cardiovascular: Negative.   Gastrointestinal: Negative.   Musculoskeletal: Negative.   Skin: Negative.   Neurological: Negative.   Psychiatric/Behavioral: Negative.     Blood pressure 132/86, pulse 78, temperature 98 F (36.7 C), temperature source Oral, resp. rate 16, height 5\' 8"  (1.727 m), weight 60.3 kg, SpO2 100 %.Body mass index is 20.22 kg/m.  General Appearance: Disheveled  Eye Contact:  Fair  Speech:  Pressured  Volume:  Increased  Mood:  Angry and Irritable  Affect:  Inappropriate  Thought Process:  Disorganized  Orientation:  Full (Time, Place, and Person)  Thought Content:  Illogical, Paranoid Ideation, Rumination and Tangential  Suicidal Thoughts:  No  Homicidal Thoughts:  No  Memory:  Immediate;   Fair Recent;   Poor Remote;   Poor  Judgement:  Impaired  Insight:  Shallow  Psychomotor Activity:  Restlessness  Concentration:  Concentration: Fair  Recall:  Poor  Fund of Knowledge:  Poor  Language:  Fair  Akathisia:  No  Handed:  Right  AIMS (if indicated):     Assets:  Desire for Improvement Housing  ADL's:  Impaired  Cognition:  Impaired,  Mild  Sleep:  Number of Hours: 0      COGNITIVE FEATURES THAT CONTRIBUTE TO RISK:  Closed-mindedness  SUICIDE RISK:   Minimal: No identifiable suicidal ideation.  Patients presenting with no risk factors but with morbid ruminations; may be classified as minimal risk based on the severity of the depressive symptoms  PLAN OF CARE: Patient kept on 15-minute checks.  Restart medications  for psychosis agitation and medical problems.  Contact outpatient treatment team.  Arrange for safe discharge planning.  I certify that inpatient services furnished can reasonably be expected to improve the patient's condition.   Mordecai Rasmussen, MD 02/21/2020, 10:08 AM

## 2020-02-21 NOTE — Tx Team (Signed)
Initial Treatment Plan 02/21/2020 4:20 AM Justin Weeks NPV:[id number]    PATIENT STRESSORS: Marital or family conflict Medication change or noncompliance   PATIENT STRENGTHS: Active sense of humor Physical Health Supportive family/friends   PATIENT IDENTIFIED PROBLEMS:     psychosis                 DISCHARGE CRITERIA:  Ability to meet basic life and health needs Adequate post-discharge living arrangements Improved stabilization in mood, thinking, and/or behavior Medical problems require only outpatient monitoring Motivation to continue treatment in a less acute level of care Need for constant or close observation no longer present Reduction of life-threatening or endangering symptoms to within safe limits Safe-care adequate arrangements made Verbal commitment to aftercare and medication compliance  PRELIMINARY DISCHARGE PLAN: Outpatient therapy Return to previous living arrangement  PATIENT/FAMILY INVOLVEMENT: This treatment plan has been presented to and reviewed with the patient, Justin Weeks, and/or family member.  The patient and family have been given the opportunity to ask questions and make suggestions.  Billy Coast, RN 02/21/2020, 4:20 AM

## 2020-02-21 NOTE — Plan of Care (Signed)
  Problem: Education: Goal: Knowledge of [facility] Education information/materials will improve Outcome: Not Progressing Goal: Emotional status will improve Outcome: Not Progressing Goal: Mental status will improve Outcome: Not Progressing Goal: Verbalization of understanding the information provided will improve Outcome: Not Progressing   Problem: Activity: Goal: Interest or engagement in activities will improve Outcome: Not Progressing Goal: Sleeping patterns will improve Outcome: Not Progressing   

## 2020-02-22 LAB — HEMOGLOBIN A1C
Hgb A1c MFr Bld: 9 % — ABNORMAL HIGH (ref 4.8–5.6)
Mean Plasma Glucose: 212 mg/dL

## 2020-02-22 LAB — GLUCOSE, CAPILLARY
Glucose-Capillary: 156 mg/dL — ABNORMAL HIGH (ref 70–99)
Glucose-Capillary: 224 mg/dL — ABNORMAL HIGH (ref 70–99)
Glucose-Capillary: 198 mg/dL — ABNORMAL HIGH (ref 70–99)

## 2020-02-22 NOTE — Plan of Care (Signed)
Pt denies depression, anxiety, SI, HI and AVH. Pt was educated on care plan and verbalizes understanding. Pt was encouraged to attend groups. Aleiah Mohammed RN Problem: Education: Goal: Knowledge of [facility] Education information/materials will improve Outcome: Progressing Goal: Emotional status will improve Outcome: Progressing Goal: Mental status will improve Outcome: Progressing Goal: Verbalization of understanding the information provided will improve Outcome: Progressing   Problem: Activity: Goal: Interest or engagement in activities will improve Outcome: Progressing Goal: Sleeping patterns will improve Outcome: Progressing   Problem: Coping: Goal: Ability to verbalize frustrations and anger appropriately will improve Outcome: Progressing Goal: Ability to demonstrate self-control will improve Outcome: Progressing   Problem: Health Behavior/Discharge Planning: Goal: Identification of resources available to assist in meeting health care needs will improve Outcome: Progressing Goal: Compliance with treatment plan for underlying cause of condition will improve Outcome: Progressing   Problem: Physical Regulation: Goal: Ability to maintain clinical measurements within normal limits will improve Outcome: Progressing   Problem: Safety: Goal: Periods of time without injury will increase Outcome: Progressing   Problem: Activity: Goal: Will verbalize the importance of balancing activity with adequate rest periods Outcome: Progressing   Problem: Education: Goal: Will be free of psychotic symptoms Outcome: Progressing Goal: Knowledge of the prescribed therapeutic regimen will improve Outcome: Progressing   Problem: Coping: Goal: Coping ability will improve Outcome: Progressing Goal: Will verbalize feelings Outcome: Progressing   Problem: Health Behavior/Discharge Planning: Goal: Compliance with prescribed medication regimen will improve Outcome: Progressing    Problem: Nutritional: Goal: Ability to achieve adequate nutritional intake will improve Outcome: Progressing   Problem: Role Relationship: Goal: Ability to communicate needs accurately will improve Outcome: Progressing Goal: Ability to interact with others will improve Outcome: Progressing   Problem: Safety: Goal: Ability to redirect hostility and anger into socially appropriate behaviors will improve Outcome: Progressing Goal: Ability to remain free from injury will improve Outcome: Progressing   Problem: Self-Care: Goal: Ability to participate in self-care as condition permits will improve Outcome: Progressing   Problem: Self-Concept: Goal: Will verbalize positive feelings about self Outcome: Progressing   

## 2020-02-22 NOTE — [facility] Suicide Risk Assessment (Signed)
[facility] INPATIENT:  Family/Significant Other Suicide Prevention Education  Suicide Prevention Education:  Education Completed; Liem Copenhaver, sister, [phone], has been identified by the patient as the family member/significant other with whom the patient will be residing, and identified as the person(s) who will aid the patient in the event of a mental health crisis (suicidal ideations/suicide attempt).  With written consent from the patient, the family member/significant other has been provided the following suicide prevention education, prior to the and/or following the discharge of the patient.  The suicide prevention education provided includes the following:  Suicide risk factors  Suicide prevention and interventions  National Suicide Hotline telephone number  [facility] assessment telephone number  [facility] Emergency Assistance 911  [facility] and/or Residential Mobile Crisis Unit telephone number  Request made of family/significant other to:  Remove weapons (e.g., guns, rifles, knives), all items previously/currently identified as safety concern.    Remove drugs/medications (over-the-counter, prescriptions, illicit drugs), all items previously/currently identified as a safety concern.  The family member/significant other verbalizes understanding of the suicide prevention education information provided.  The family member/significant other agrees to remove the items of safety concern listed above.  Pt's sister reports that she had the patient admitted because "he as looking bad and dehydrated and in need of rest and medications".  She reports that the patient has not been taking his medicine and that is her primary goal.  She reports that pt "has a good heart and lets any ol' person live with them and his home is not a crack house!".  She reports that the patient has a history of crack use and she thinks that he may have been using recently.  She  reports that the patient does not have access to weapons that she is aware of. She stated that the patient has a guardian, however when pressed for additional information she amended that she was not sure if pt had a guardian or not.   Harden Mo 02/22/2020, 11:52 AM

## 2020-02-22 NOTE — [facility] Counselor (Signed)
CSW staff spoke with Liborio Nixon at [facility].   Per Liborio Nixon pt is his own guardian, he has a Paediatric nurse [employer].  She reports that the patient "up and left" his apartment though he has paid his rent through the month.  She reports that the patient can return to his home though it is unclear what the patient can do come July 2021.  Penni Homans, MSW, LCSW 02/22/2020 2:33 PM

## 2020-02-22 NOTE — Progress Notes (Signed)
[facility] MD Progress Note  02/22/2020 1:23 PM CAMBELL STANEK  MRN:  [id number] Subjective: Patient seen chart reviewed.  Patient has been compliant with medicine.  He tells me he is feeling much better today.  Staff noticed that the patient is much more organized and calm in his behavior.  His affect was appropriate talking with me.  He denied any hallucinations.  He did not get irritable or agitated.  He still talks about needing to get home so that he can get a new bed in his house.  His blood sugars are running a little bit high despite his oral medication.  No side effects noted from psychiatric medicine otherwise Principal Problem: Schizophrenia (HCC) Diagnosis: Principal Problem:   Schizophrenia (HCC) Active Problems:   Diabetes (HCC)   Noncompliance   Hypothyroidism   HTN (hypertension)   Dyslipidemia  Total Time spent with patient: 30 minutes  Past Psychiatric History: Past history of chronic schizophrenia  Past Medical History:  Past Medical History:  Diagnosis Date  . Arthritis   . Asthma   . Blood transfusion without reported diagnosis   . Diabetes mellitus without complication (HCC)   . Hypertension   . Hypothyroid 07/03/2015  . Personal history of tobacco use, presenting hazards to health 05/31/2015  . Schizoaffective disorder (HCC)   . Seizures (HCC)    History reviewed. No pertinent surgical history. Family History: History reviewed. No pertinent family history. Family Psychiatric  History: See previous Social History:  Social History   Substance and Sexual Activity  Alcohol Use No     Social History   Substance and Sexual Activity  Drug Use Yes  . Types: Cocaine    Social History   Socioeconomic History  . Marital status: Divorced    Spouse name: Not on file  . Number of children: Not on file  . Years of education: Not on file  . Highest education level: Not on file  Occupational History  . Not on file  Tobacco Use  . Smoking status: Current Every Day  Smoker    Packs/day: 1.00    Years: 40.00    Pack years: 40.00    Types: Cigarettes  . Smokeless tobacco: Never Used  Vaping Use  . Vaping Use: Unknown  Substance and Sexual Activity  . Alcohol use: No  . Drug use: Yes    Types: Cocaine  . Sexual activity: Yes  Other Topics Concern  . Not on file  Social History Narrative  . Not on file   Social Determinants of Health   Financial Resource Strain:   . Difficulty of Paying Living Expenses:   Food Insecurity:   . Worried About Programme researcher, broadcasting/film/video in the Last Year:   . Barista in the Last Year:   Transportation Needs:   . Freight forwarder (Medical):   Marland Kitchen Lack of Transportation (Non-Medical):   Physical Activity:   . Days of Exercise per Week:   . Minutes of Exercise per Session:   Stress:   . Feeling of Stress :   Social Connections:   . Frequency of Communication with Friends and Family:   . Frequency of Social Gatherings with Friends and Family:   . Attends Religious Services:   . Active Member of Clubs or Organizations:   . Attends Banker Meetings:   Marland Kitchen Marital Status:    Additional Social History:  Sleep: Fair  Appetite:  Fair  Current Medications: Current Facility-Administered Medications  Medication Dose Route Frequency Provider Last Rate Last Admin  . acetaminophen (TYLENOL) tablet 650 mg  650 mg Oral Q6H PRN Gillermo Murdoch, NP      . alum & mag hydroxide-simeth (MAALOX/MYLANTA) 200-200-20 MG/5ML suspension 30 mL  30 mL Oral Q4H PRN Gillermo Murdoch, NP      . clonazePAM Scarlette Calico) tablet 0.5 mg  0.5 mg Oral BID Amandeep Hogston, Jackquline Denmark, MD   0.5 mg at 02/22/20 0740  . divalproex (DEPAKOTE ER) 24 hr tablet 250 mg  250 mg Oral q morning - 10a Gillermo Murdoch, NP   250 mg at 02/22/20 1000  . divalproex (DEPAKOTE ER) 24 hr tablet 750 mg  750 mg Oral QHS Gillermo Murdoch, NP      . donepezil (ARICEPT) tablet 10 mg  10 mg Oral Daily Gillermo Murdoch, NP   10 mg at 02/22/20 0742  . feeding supplement (GLUCERNA SHAKE) (GLUCERNA SHAKE) liquid 237 mL  237 mL Oral BID BM Elmond Poehlman T, MD   237 mL at 02/22/20 1229  . glipiZIDE (GLUCOTROL XL) 24 hr tablet 10 mg  10 mg Oral Q breakfast Claud Gowan, Jackquline Denmark, MD   10 mg at 02/22/20 0739  . insulin aspart (novoLOG) injection 0-9 Units  0-9 Units Subcutaneous TID WC Tashaya Ancrum, Jackquline Denmark, MD   3 Units at 02/22/20 1155  . levothyroxine (SYNTHROID) tablet 50 mcg  50 mcg Oral Q0600 Gillermo Murdoch, NP   50 mcg at 02/22/20 0651  . linagliptin (TRADJENTA) tablet 5 mg  5 mg Oral Daily Gillermo Murdoch, NP   5 mg at 02/22/20 0739  . magnesium hydroxide (MILK OF MAGNESIA) suspension 30 mL  30 mL Oral Daily PRN Gillermo Murdoch, NP   30 mL at 02/21/20 1425  . metFORMIN (GLUCOPHAGE-XR) 24 hr tablet 1,000 mg  1,000 mg Oral BID WC Gillermo Murdoch, NP   1,000 mg at 02/22/20 0740  . multivitamin with minerals tablet 1 tablet  1 tablet Oral Daily Marylen Zuk, Jackquline Denmark, MD   1 tablet at 02/22/20 0739  . paliperidone (INVEGA) 24 hr tablet 9 mg  9 mg Oral Daily Annina Piotrowski, Jackquline Denmark, MD   9 mg at 02/22/20 0740  . simvastatin (ZOCOR) tablet 20 mg  20 mg Oral QHS Gillermo Murdoch, NP      . traZODone (DESYREL) tablet 50 mg  50 mg Oral QHS PRN Gillermo Murdoch, NP        Lab Results:  Results for orders placed or performed during the hospital encounter of 02/21/20 (from the past 48 hour(s))  Hemoglobin A1c     Status: Abnormal   Collection Time: 02/20/20  3:23 PM  Result Value Ref Range   Hgb A1c MFr Bld 9.0 (H) 4.8 - 5.6 %    Comment: (NOTE)         Prediabetes: 5.7 - 6.4         Diabetes: >6.4         Glycemic control for adults with diabetes: <7.0    Mean Plasma Glucose 212 mg/dL    Comment: (NOTE) Performed At: [facility] [address] [place], [place] [id number] Jolene Schimke MD QM:[id number]   Glucose, capillary     Status: Abnormal   Collection Time: 02/21/20  7:04 AM  Result  Value Ref Range   Glucose-Capillary 372 (H) 70 - 99 mg/dL    Comment: Glucose reference range applies only to samples taken after fasting  for at least 8 hours.  Glucose, capillary     Status: Abnormal   Collection Time: 02/21/20 11:53 AM  Result Value Ref Range   Glucose-Capillary 184 (H) 70 - 99 mg/dL    Comment: Glucose reference range applies only to samples taken after fasting for at least 8 hours.   Comment 1 Notify RN   Glucose, capillary     Status: Abnormal   Collection Time: 02/21/20  4:31 PM  Result Value Ref Range   Glucose-Capillary 164 (H) 70 - 99 mg/dL    Comment: Glucose reference range applies only to samples taken after fasting for at least 8 hours.  Glucose, capillary     Status: Abnormal   Collection Time: 02/22/20  7:02 AM  Result Value Ref Range   Glucose-Capillary 156 (H) 70 - 99 mg/dL    Comment: Glucose reference range applies only to samples taken after fasting for at least 8 hours.   Comment 1 Notify RN   Glucose, capillary     Status: Abnormal   Collection Time: 02/22/20 11:47 AM  Result Value Ref Range   Glucose-Capillary 224 (H) 70 - 99 mg/dL    Comment: Glucose reference range applies only to samples taken after fasting for at least 8 hours.   Comment 1 Notify RN     Blood Alcohol level:  Lab Results  Component Value Date   ETH <10 02/20/2020   ETH <10 10/17/2019    Metabolic Disorder Labs: Lab Results  Component Value Date   HGBA1C 9.0 (H) 02/20/2020   MPG 212 02/20/2020   Lab Results  Component Value Date   PROLACTIN 21.9 (H) 10/18/2019   Lab Results  Component Value Date   CHOL 113 07/04/2015   TRIG 118 07/04/2015   HDL 30 (L) 07/04/2015   CHOLHDL 3.8 07/04/2015   VLDL 24 07/04/2015   LDLCALC 59 07/04/2015   LDLCALC 41 09/21/2014    Physical Findings: AIMS: Facial and Oral Movements Muscles of Facial Expression: None, normal Lips and Perioral Area: None, normal Jaw: None, normal Tongue: None, normal,Extremity  Movements Upper (arms, wrists, hands, fingers): None, normal Lower (legs, knees, ankles, toes): None, normal, Trunk Movements Neck, shoulders, hips: None, normal, Overall Severity Severity of abnormal movements (highest score from questions above): None, normal Incapacitation due to abnormal movements: None, normal Patient's awareness of abnormal movements (rate only patient's report): No Awareness, Dental Status Current problems with teeth and/or dentures?: No Does patient usually wear dentures?: No  CIWA:    COWS:     Musculoskeletal: Strength & Muscle Tone: within normal limits Gait & Station: normal Patient leans: N/A  Psychiatric Specialty Exam: Physical Exam  Nursing note and vitals reviewed. Constitutional: He appears well-developed.  HENT:  Head: Normocephalic and atraumatic.  Eyes: Pupils are equal, round, and reactive to light. Conjunctivae are normal.  Cardiovascular: Normal heart sounds.  Respiratory: Effort normal.  GI: Soft.  Musculoskeletal:        General: Normal range of motion.     Cervical back: Normal range of motion.  Neurological: He is alert.  Skin: Skin is warm and dry.  Psychiatric: Mood normal.    Review of Systems  Constitutional: Negative.   HENT: Negative.   Eyes: Negative.   Respiratory: Negative.   Cardiovascular: Negative.   Gastrointestinal: Negative.   Musculoskeletal: Negative.   Skin: Negative.   Neurological: Negative.   Psychiatric/Behavioral: Negative.     Blood pressure (!) 119/91, pulse (!) 101, temperature 97.8 F (36.6 C), temperature  source Oral, resp. rate 16, height 5\' 8"  (1.727 m), weight 60.3 kg, SpO2 98 %.Body mass index is 20.22 kg/m.  General Appearance: Casual  Eye Contact:  Good  Speech:  Clear and Coherent  Volume:  Decreased  Mood:  Euthymic  Affect:  Congruent  Thought Process:  Goal Directed  Orientation:  Full (Time, Place, and Person)  Thought Content:  Logical  Suicidal Thoughts:  No  Homicidal  Thoughts:  No  Memory:  Immediate;   Fair Recent;   Fair Remote;   Fair  Judgement:  Impaired  Insight:  Shallow  Psychomotor Activity:  Normal  Concentration:  Concentration: Fair  Recall:  [employer] of Knowledge:  Fair  Language:  Fair  Akathisia:  No  Handed:  Right  AIMS (if indicated):     Assets:  Desire for Improvement Housing  ADL's:  Impaired  Cognition:  Impaired,  Mild  Sleep:  Number of Hours: 8     Treatment Plan Summary: Daily contact with patient to assess and evaluate symptoms and progress in treatment, Medication management and Plan Significant improvement over where he was when he first came into the hospital.  No change to current medicine.  Next Invega shot would be due in several days.  Continue oral medicine.  Check Depakote level tomorrow.  Alethia Berthold, MD 02/22/2020, 1:23 PM

## 2020-02-22 NOTE — BHH Counselor (Signed)
Adult Comprehensive Assessment  Patient ID: Justin Weeks, male   DOB: 10-Jun-1961, 59 y.o.   MRN: [id number]  Current Stressors:  Patient states their primary concerns and needs for treatment are:: "I don't know" Patient states their goals for this hospitilization and ongoing recovery are:: "save enough to get my box springs and mattress"   Living/Environment/Situation:  Living Arrangements: Alone Who else lives in the home?: Pt lives alone How long has patient lived in current situation?: "21 years" What is atmosphere in current home: Comfortable, Quarry manager   Family History:  Marital status: Divorced Divorced, when?: 1983 What types of issues is patient dealing with in the relationship?: She cheated on him.  Are you sexually active?: No What is your sexual orientation?: heterosexual Has your sexual activity been affected by drugs, alcohol, medication, or emotional stress?: pt denies Does patient have children?: Yes How many children?: 1 How is patient's relationship with their children?: Pt has a son and reports "I don't see him that much"   Childhood History:  By whom was/is the patient raised?: Both parents Description of patient's relationship with caregiver when they were a child: "Wonderful! We had a good time."  Patient's description of current relationship with people who raised him/her: Pts mother is deceased, pts reports a "fine" relationship with his father Does patient have siblings?: Yes Number of Siblings: 4 Description of patient's current relationship with siblings: Pt reports he has a good relationship with his siblings Did patient suffer any verbal/emotional/physical/sexual abuse as a child?: No Did patient suffer from severe childhood neglect?: No Has patient ever been sexually abused/assaulted/raped as an adolescent or adult?: No Was the patient ever a victim of a crime or a disaster?: No Witnessed domestic violence?: No Has patient been effected by domestic  violence as an adult?: No   Education:  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  Patient's job has been impacted by current illness: No What is the longest time patient has a held a job?: 4 years Where was the patient employed at that time?: [employer]  Did You Receive Any Psychiatric Treatment/Services While in the [employer]?: No Are There Guns or Other Weapons in [place]?: No Are These Psychologist, educational?: (N/A)   Financial Resources:   Financial resources: [employer], Receives SSI, Medicare, Medicaid, Food stamps Does patient have a representative payee or guardian?: Yes Name of representative payee or guardian: Pt reports having a payee named Chris. Pt unsure of Payee last name or what agency Chirs works for   Alcohol/Substance Abuse:   What has been your use of drugs/alcohol within the last 12 months?: pt denies If attempted suicide, did drugs/alcohol play a role in this?: No Alcohol/Substance Abuse Treatment Hx: Denies past history Has alcohol/substance abuse ever caused legal problems?: No   Social Support System:   Patient's Community Support System: Good Describe Community Support System: "Materials engineer" Type of faith/religion: Tree surgeon:   Leisure and Hobbies: Patient declined to answer.   Strengths/Needs:   What is the patient's perception of their strengths?: Patient declined to answer.Patient states these barriers may affect/interfere with their treatment: none reported Patient states these barriers may affect their return to the community: none reported Other important information patient would like considered in planning for their treatment: Pt wants to continue with his  ACT team   Discharge Plan:   Currently receiving community mental  health services: Yes (From Whom)([facility]) Patient states concerns and preferences for aftercare planning are: Pt will continue with his current ACT services Patient states they will know when they are safe and ready for discharge when: "When the doctor feels like" Does patient have access to transportation?: No Does patient have financial barriers related to discharge medications?: No Will patient be returning to same living situation after discharge?: Yes Summary/Recommendations:   Summary and Recommendations (to be completed by the evaluator): Patient is a 59 year old male in a long-term relationship from [place], [place] [facility] [place]).   He reports that he receives SSI and is currently employed at a [employer].  He presents to the hospital under IVC after his sister became concerned that the patient was unaccounted for, for one week and has been off his medications for one month.  He has a primary diagnosis of Schizophrenia.  Recommendations include: crisis stabilization, therapeutic milieu, encourage group attendance and participation, medication management for detox/mood stabilization and development of comprehensive mental wellness/sobriety plan.  Harden Mo. 02/22/2020

## 2020-02-22 NOTE — Progress Notes (Signed)
Patient in bed resting when care taken over. Pt refused cbg and meds tonight. Patient in no apparent distress. Encouragement and support provided. Safety checks maintained. Medications given as prescribed. Pt remains safe on unit with q15 min checks.

## 2020-02-22 NOTE — BHH Group Notes (Signed)
Emotional Regulation 02/22/2020 1PM  Type of Therapy/Topic:  Group Therapy:  Emotion Regulation  Participation Level:  None   Description of Group:   The purpose of this group is to assist patients in learning to regulate negative emotions and experience positive emotions. Patients will be guided to discuss ways in which they have been vulnerable to their negative emotions. These vulnerabilities will be juxtaposed with experiences of positive emotions or situations, and patients will be challenged to use positive emotions to combat negative ones. Special emphasis will be placed on coping with negative emotions in conflict situations, and patients will process healthy conflict resolution skills.  Therapeutic Goals: 1. Patient will identify two positive emotions or experiences to reflect on in order to balance out negative emotions 2. Patient will label two or more emotions that they find the most difficult to experience 3. Patient will demonstrate positive conflict resolution skills through discussion and/or role plays  Summary of Patient Progress: No participation during group session. Pt sat quietly but eventually left and did not return.      Therapeutic Modalities:   Cognitive Behavioral Therapy Feelings Identification Dialectical Behavioral Therapy   Suzan Slick, LCSW 02/22/2020 2:06 PM

## 2020-02-22 NOTE — Progress Notes (Signed)
Recreation Therapy Notes  INPATIENT RECREATION THERAPY ASSESSMENT  Patient Details Name: ABIJAH ROUSSEL MRN: [id number] DOB: 1961-08-01 Today's Date: 02/22/2020       Information Obtained From: Patient  Able to Participate in Assessment/Interview: Yes  Patient Presentation: Responsive  Reason for Admission (Per Patient): Active Symptoms  Patient Stressors:    Coping Skills:   Exercise  Leisure Interests (2+):  Exercise - Walking, Individual - TV Adriana Simas)  Frequency of Recreation/Participation: Weekly  Awareness of Community Resources:  Yes  Community Resources:  [place], [employer]  Current Use:    If no, Barriers?:    Expressed Interest in [employer] Information:    [place] of Residence:  Film/video editor  Patient Main Form of Transportation: Walk  Patient Strengths:  I have an apartment  Patient Identified Areas of Improvement:  Saving  Patient Goal for Hospitalization:  Getting out  Current SI (including self-harm):  No  Current HI:  No  Current AVH: No  Staff Intervention Plan: Group Attendance, Collaborate with Interdisciplinary Treatment Team  Consent to Intern Participation: N/A  Hitesh Fouche 02/22/2020, 2:43 PM

## 2020-02-22 NOTE — Plan of Care (Signed)
  Problem: Safety: Goal: Periods of time without injury will increase Outcome: Progressing   Problem: Safety: Goal: Ability to redirect hostility and anger into socially appropriate behaviors will improve Outcome: Progressing   

## 2020-02-22 NOTE — Progress Notes (Addendum)
D- Patient alert and oriented. Affect/mood is pleasant. Pt denies SI, HI, AVH, and pain. Pt remained calm and cooperative today. Pt met his goal of taking a shower and going to groups.   A- Scheduled medications administered to patient, per MD orders. Support and encouragement provided.  Routine safety checks conducted every 15 minutes.  Patient informed to notify staff with problems or concerns.  R- No adverse drug reactions noted. Patient contracts for safety at this time. Patient compliant with medications and treatment plan. Patient receptive, calm, and cooperative. Patient interacts well with others on the unit.  Patient remains safe at this time.    RN 

## 2020-02-22 NOTE — Progress Notes (Signed)
Recreation Therapy Notes   Date: 02/22/2020  Time: 9:30 am  Location: Craft room   Behavioral response: Appropriate, redirection needed  Intervention Topic: Self-esteem   Discussion/Intervention:  Group content today was focused on self-esteem. Patient defined self-esteem and where it comes form. The group described reasons self-esteem is important. Individuals stated things that impact self-esteem and positive ways to improve self-esteem. The group participated in the intervention "folding butterfly" where patients were able to create a butterfly of positive things, they can do to improve their self-esteem.  Clinical Observations/Feedback:  Patient came to group and defined self-esteem as taking a bath. He expressed that self-esteem comes from getting stuff off your chest. Participant explained that two people not agreeing on things can impact self-esteem. Individual was social with peers and staff while participating in the intervention during group.  Shamya Macfadden LRT/CTRS         Kaysen Sefcik 02/22/2020 12:03 PM

## 2020-02-23 LAB — GLUCOSE, CAPILLARY
Glucose-Capillary: 292 mg/dL — ABNORMAL HIGH (ref 70–99)
Glucose-Capillary: 247 mg/dL — ABNORMAL HIGH (ref 70–99)
Glucose-Capillary: 121 mg/dL — ABNORMAL HIGH (ref 70–99)
Glucose-Capillary: 341 mg/dL — ABNORMAL HIGH (ref 70–99)

## 2020-02-23 LAB — VALPROIC ACID LEVEL: Valproic Acid Lvl: 56 ug/mL (ref 50.0–100.0)

## 2020-02-23 MED ORDER — LIVING WELL WITH DIABETES BOOK
Freq: Once | Status: DC
  Filled 2020-02-23: qty 1

## 2020-02-23 MED ORDER — LINAGLIPTIN 5 MG PO TABS
5.0000 mg | ORAL_TABLET | Freq: Every day | ORAL | Status: DC
  Administered 2020-02-24: 5 mg via ORAL
  Filled 2020-02-23: qty 1

## 2020-02-23 MED ORDER — TEMAZEPAM 15 MG PO CAPS
15.0000 mg | ORAL_CAPSULE | Freq: Every evening | ORAL | Status: DC | PRN
  Administered 2020-02-23: 15 mg via ORAL
  Filled 2020-02-23: qty 1

## 2020-02-23 MED ORDER — DIVALPROEX SODIUM ER 500 MG PO TB24
500.0000 mg | ORAL_TABLET | Freq: Every morning | ORAL | Status: DC
  Administered 2020-02-24: 500 mg via ORAL
  Filled 2020-02-23: qty 1

## 2020-02-23 MED ORDER — LINAGLIPTIN 5 MG PO TABS: 10.0000 mg | ORAL_TABLET | Freq: Every day | ORAL | Status: DC

## 2020-02-23 NOTE — Progress Notes (Signed)
Pt denies depression, anxiety, SI, HI and AVH. Pt was educated on care plan and verbalizes understanding. Pt was encouraged to attend groups. Torrie Mayers RN

## 2020-02-23 NOTE — Progress Notes (Signed)
[facility] MD Progress Note  02/23/2020 2:02 PM [place]  MRN:  [id number] Subjective: Patient seen follow-up for this gentleman with schizophrenia and diabetes.  Patient reports that he is feeling better.  He denies any suicidal or homicidal thoughts or hallucinations.  He is taking care of his health and hygiene appropriately and has been interacting with staff and patients appropriately.  He still did not sleep well last night and he acknowledges this that he was up much of the night.  Does not sound like he was any trouble but he was pacing around a lot.  His diabetes is still not under very good control with his sugars running often in the range of 200 and up.  Patient was appropriate in discussion today and was understanding about staying in the hospital another day. Principal Problem: Schizophrenia (HCC) Diagnosis: Principal Problem:   Schizophrenia (HCC) Active Problems:   Diabetes (HCC)   Noncompliance   Hypothyroidism   HTN (hypertension)   Dyslipidemia  Total Time spent with patient: 30 minutes  Past Psychiatric History: He has a history of schizophrenia longstanding mostly stable on outpatient medicine with act team support  Past Medical History:  Past Medical History:  Diagnosis Date  . Arthritis   . Asthma   . Blood transfusion without reported diagnosis   . Diabetes mellitus without complication (HCC)   . Hypertension   . Hypothyroid 07/03/2015  . Personal history of tobacco use, presenting hazards to health 05/31/2015  . Schizoaffective disorder (HCC)   . Seizures (HCC)    History reviewed. No pertinent surgical history. Family History: History reviewed. No pertinent family history. Family Psychiatric  History: See previous Social History:  Social History   Substance and Sexual Activity  Alcohol Use No     Social History   Substance and Sexual Activity  Drug Use Yes  . Types: Cocaine    Social History   Socioeconomic History  . Marital status: Divorced     Spouse name: Not on file  . Number of children: Not on file  . Years of education: Not on file  . Highest education level: Not on file  Occupational History  . Not on file  Tobacco Use  . Smoking status: Current Every Day Smoker    Packs/day: 1.00    Years: 40.00    Pack years: 40.00    Types: Cigarettes  . Smokeless tobacco: Never Used  Vaping Use  . Vaping Use: Unknown  Substance and Sexual Activity  . Alcohol use: No  . Drug use: Yes    Types: Cocaine  . Sexual activity: Yes  Other Topics Concern  . Not on file  Social History Narrative  . Not on file   Social Determinants of Health   Financial Resource Strain:   . Difficulty of Paying Living Expenses:   Food Insecurity:   . Worried About Programme researcher, broadcasting/film/video in the Last Year:   . Barista in the Last Year:   Transportation Needs:   . Freight forwarder (Medical):   Marland Kitchen Lack of Transportation (Non-Medical):   Physical Activity:   . Days of Exercise per Week:   . Minutes of Exercise per Session:   Stress:   . Feeling of Stress :   Social Connections:   . Frequency of Communication with Friends and Family:   . Frequency of Social Gatherings with Friends and Family:   . Attends Religious Services:   . Active Member of Clubs or Organizations:   .  Attends Banker Meetings:   Marland Kitchen Marital Status:    Additional Social History:                         Sleep: Fair  Appetite:  Fair  Current Medications: Current Facility-Administered Medications  Medication Dose Route Frequency Provider Last Rate Last Admin  . acetaminophen (TYLENOL) tablet 650 mg  650 mg Oral Q6H PRN Gillermo Murdoch, NP      . alum & mag hydroxide-simeth (MAALOX/MYLANTA) 200-200-20 MG/5ML suspension 30 mL  30 mL Oral Q4H PRN Gillermo Murdoch, NP      . clonazePAM Scarlette Calico) tablet 0.5 mg  0.5 mg Oral BID Stephfon Bovey, Jackquline Denmark, MD   0.5 mg at 02/23/20 0820  . [START ON 02/24/2020] divalproex (DEPAKOTE ER) 24 hr  tablet 500 mg  500 mg Oral q morning - 10a Sareena Odeh T, MD      . divalproex (DEPAKOTE ER) 24 hr tablet 750 mg  750 mg Oral QHS Gillermo Murdoch, NP   750 mg at 02/22/20 2149  . donepezil (ARICEPT) tablet 10 mg  10 mg Oral Daily Gillermo Murdoch, NP   10 mg at 02/23/20 8937  . feeding supplement (GLUCERNA SHAKE) (GLUCERNA SHAKE) liquid 237 mL  237 mL Oral BID BM Undra Trembath T, MD   237 mL at 02/23/20 1000  . glipiZIDE (GLUCOTROL XL) 24 hr tablet 10 mg  10 mg Oral Q breakfast Zackaria Burkey, Jackquline Denmark, MD   10 mg at 02/23/20 3428  . insulin aspart (novoLOG) injection 0-9 Units  0-9 Units Subcutaneous TID WC Greggory Safranek, Jackquline Denmark, MD   3 Units at 02/23/20 1152  . levothyroxine (SYNTHROID) tablet 50 mcg  50 mcg Oral Q0600 Gillermo Murdoch, NP   50 mcg at 02/23/20 0644  . [START ON 02/24/2020] linagliptin (TRADJENTA) tablet 5 mg  5 mg Oral Daily Agbata, Tochukwu, MD      . living well with diabetes book MISC   Does not apply Once Donaldo Teegarden T, MD      . magnesium hydroxide (MILK OF MAGNESIA) suspension 30 mL  30 mL Oral Daily PRN Gillermo Murdoch, NP   30 mL at 02/21/20 1425  . metFORMIN (GLUCOPHAGE-XR) 24 hr tablet 1,000 mg  1,000 mg Oral BID WC Gillermo Murdoch, NP   1,000 mg at 02/23/20 7681  . multivitamin with minerals tablet 1 tablet  1 tablet Oral Daily Donaldson Richter, Jackquline Denmark, MD   1 tablet at 02/23/20 [phone]  . paliperidone (INVEGA) 24 hr tablet 9 mg  9 mg Oral Daily Sharryn Belding, Jackquline Denmark, MD   9 mg at 02/23/20 0820  . simvastatin (ZOCOR) tablet 20 mg  20 mg Oral QHS Gillermo Murdoch, NP   20 mg at 02/22/20 2148  . temazepam (RESTORIL) capsule 15 mg  15 mg Oral QHS PRN Kalum Minner, Jackquline Denmark, MD        Lab Results:  Results for orders placed or performed during the hospital encounter of 02/21/20 (from the past 48 hour(s))  Glucose, capillary     Status: Abnormal   Collection Time: 02/21/20  4:31 PM  Result Value Ref Range   Glucose-Capillary 164 (H) 70 - 99 mg/dL    Comment: Glucose reference  range applies only to samples taken after fasting for at least 8 hours.  Glucose, capillary     Status: Abnormal   Collection Time: 02/22/20  7:02 AM  Result Value Ref Range   Glucose-Capillary 156 (H) 70 - 99  mg/dL    Comment: Glucose reference range applies only to samples taken after fasting for at least 8 hours.   Comment 1 Notify RN   Glucose, capillary     Status: Abnormal   Collection Time: 02/22/20 11:47 AM  Result Value Ref Range   Glucose-Capillary 224 (H) 70 - 99 mg/dL    Comment: Glucose reference range applies only to samples taken after fasting for at least 8 hours.   Comment 1 Notify RN   Glucose, capillary     Status: Abnormal   Collection Time: 02/22/20  4:13 PM  Result Value Ref Range   Glucose-Capillary 198 (H) 70 - 99 mg/dL    Comment: Glucose reference range applies only to samples taken after fasting for at least 8 hours.   Comment 1 Notify RN   Valproic acid level     Status: None   Collection Time: 02/23/20  7:11 AM  Result Value Ref Range   Valproic Acid Lvl 56 50.0 - 100.0 ug/mL    Comment: Performed at [facility], [address] [place], [place] [address]  Glucose, capillary     Status: Abnormal   Collection Time: 02/23/20  7:40 AM  Result Value Ref Range   Glucose-Capillary 292 (H) 70 - 99 mg/dL    Comment: Glucose reference range applies only to samples taken after fasting for at least 8 hours.   Comment 1 Notify RN    Comment 2 Document in Chart   Glucose, capillary     Status: Abnormal   Collection Time: 02/23/20 11:39 AM  Result Value Ref Range   Glucose-Capillary 247 (H) 70 - 99 mg/dL    Comment: Glucose reference range applies only to samples taken after fasting for at least 8 hours.   Comment 1 Notify RN    Comment 2 Document in Chart     Blood Alcohol level:  Lab Results  Component Value Date   ETH <10 02/20/2020   ETH <10 10/17/2019    Metabolic Disorder Labs: Lab Results  Component Value Date   HGBA1C 9.0 (H)  02/20/2020   MPG 212 02/20/2020   Lab Results  Component Value Date   PROLACTIN 21.9 (H) 10/18/2019   Lab Results  Component Value Date   CHOL 113 07/04/2015   TRIG 118 07/04/2015   HDL 30 (L) 07/04/2015   CHOLHDL 3.8 07/04/2015   VLDL 24 07/04/2015   LDLCALC 59 07/04/2015   LDLCALC 41 09/21/2014    Physical Findings: AIMS: Facial and Oral Movements Muscles of Facial Expression: None, normal Lips and Perioral Area: None, normal Jaw: None, normal Tongue: None, normal,Extremity Movements Upper (arms, wrists, hands, fingers): None, normal Lower (legs, knees, ankles, toes): None, normal, Trunk Movements Neck, shoulders, hips: None, normal, Overall Severity Severity of abnormal movements (highest score from questions above): None, normal Incapacitation due to abnormal movements: None, normal Patient's awareness of abnormal movements (rate only patient's report): No Awareness, Dental Status Current problems with teeth and/or dentures?: No Does patient usually wear dentures?: No  CIWA:    COWS:     Musculoskeletal: Strength & Muscle Tone: within normal limits Gait & Station: normal Patient leans: N/A  Psychiatric Specialty Exam: Physical Exam  Nursing note and vitals reviewed. Constitutional: He appears well-developed. He is cooperative.  HENT:  Head: Normocephalic and atraumatic.  Eyes: Pupils are equal, round, and reactive to light. Conjunctivae are normal.  Cardiovascular: Normal heart sounds.  Respiratory: Effort normal.  GI: Soft.  Musculoskeletal:  General: Normal range of motion.     Cervical back: Normal range of motion.  Neurological: He is alert.  Skin: Skin is warm and dry.  Psychiatric: His speech is normal. Judgment and thought content normal. His mood appears anxious. Cognition and memory are impaired.    Review of Systems  Constitutional: Negative.   HENT: Negative.   Eyes: Negative.   Respiratory: Negative.   Cardiovascular: Negative.    Gastrointestinal: Negative.   Musculoskeletal: Negative.   Skin: Negative.   Neurological: Negative.   Psychiatric/Behavioral: Negative.     Blood pressure 114/70, pulse (!) 124, temperature 97.9 F (36.6 C), temperature source Oral, resp. rate 17, height 5\' 8"  (1.727 m), weight 60.3 kg, SpO2 100 %.Body mass index is 20.22 kg/m.  General Appearance: Casual  Eye Contact:  Good  Speech:  Clear and Coherent  Volume:  Normal  Mood:  Euthymic  Affect:  Congruent  Thought Process:  Coherent  Orientation:  Full (Time, Place, and Person)  Thought Content:  Logical  Suicidal Thoughts:  No  Homicidal Thoughts:  No  Memory:  Immediate;   Fair Recent;   Fair Remote;   Fair  Judgement:  Fair  Insight:  Fair  Psychomotor Activity:  Normal  Concentration:  Concentration: Fair  Recall:  [employer] of Knowledge:  Fair  Language:  Fair  Akathisia:  No  Handed:  Right  AIMS (if indicated):     Assets:  Desire for Improvement Housing Physical Health Resilience Social Support  ADL's:  Intact  Cognition:  WNL  Sleep:  Number of Hours: 6.15     Treatment Plan Summary: Daily contact with patient to assess and evaluate symptoms and progress in treatment, Medication management and Plan Patient seen and chart reviewed.  I put in a consult to hospitalist for diabetes.  The patient does not typically use insulin at home and rather than make him start using insulin I thought any improvement in his oral medicine would be helpful.  Very much appreciate their help.  I think they also gave him a talking to about sugar and diet because he came to me this afternoon and promised he would do better on not eating any sugar.  He wanted me to speak to his friend, Levada Dy, who evidently helps him out with things now and then.  I gave her a call and it turns out she was the person he was reaching out to to help him get a new mattress.  I explained the situation to her and she says she will just follow up with  him.  Meanwhile I have changed his nighttime sleep medicine from trazodone to Restoril which I think is probably more appropriate in a patient with bipolar disorder.  In any case I am pretty sure we will be ready for discharge tomorrow.  Alethia Berthold, MD 02/23/2020, 2:02 PM

## 2020-02-23 NOTE — Consult Note (Signed)
[facility] Medical Consultation  Justin Weeks NWG:[id number] DOB: 06/03/1961 DOA: 02/21/2020 PCP: Emogene Morgan, MD   Requesting physician: Dr Toni Amend Date of consultation: 02/23/20 Reason for consultation: Hyperglycemia  Impression/Recommendations Principal Problem:   Schizophrenia (HCC) Active Problems:   Diabetes (HCC)   Noncompliance   Hypothyroidism   HTN (hypertension)   Dyslipidemia    1. Hyperglycemia Patient with a history of type 2 diabetes mellitus on oral hypoglycemic agents admitted to behavioral health for stabilization of his schizophrenia. Medical consult was requested for management of hyperglycemia. Patient is fasting blood sugars have been over 200g/dl persistently. Patient is on 3 oral hypoglycemic agents and is on maximum doses of all 3, he is on Metformin 1 g daily, glipizide XL 10 mg daily as well as linagliptin 5 mg daily. Due to his mental health issues patient is unlikely to be compliant with insulin and does not want to be placed on insulin either. He has a hemoglobin A1c of 9 and with improved dietary compliance this may improve his fasting blood sugars as well as his A1c level Will recommend diabetic education as well as nutrition evaluation He denies having any dizziness, chest pain, shortness of breath, nausea, vomiting, abdominal pain or any changes in his bowel habits   I will followup again tomorrow. Please contact me if I can be of assistance in the meanwhile. Thank you for this consultation.  Chief Complaint: High blood sugars  HPI:  Patient is a 59 year old African-American male with a history of schizophrenia admitted to behavioral health for stabilization. Medical consult requested for hypoglycemia  Review of Systems:    Past Medical History:  Diagnosis Date  . Arthritis   . Asthma   . Blood transfusion without reported diagnosis   . Diabetes mellitus without complication (HCC)   . Hypertension   . Hypothyroid 07/03/2015  .  Personal history of tobacco use, presenting hazards to health 05/31/2015  . Schizoaffective disorder (HCC)   . Seizures (HCC)    History reviewed. No pertinent surgical history. Social History:  reports that he has been smoking cigarettes. He has a 40.00 pack-year smoking history. He has never used smokeless tobacco. He reports current drug use. Drug: Cocaine. He reports that he does not drink alcohol.  Allergies  Allergen Reactions  . 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     History reviewed. No pertinent family history.  Prior to Admission medications   Medication Sig Start Date End Date Taking? Authorizing Provider  clonazePAM (KLONOPIN) 0.5 MG tablet Take 0.5 mg by mouth 2 (two) times daily.     [provider]  divalproex (DEPAKOTE ER) 250 MG 24 hr tablet Take 1 tablet (250 mg total) by mouth every morning. 10/19/19   Money, Gerlene Burdock, FNP  divalproex (DEPAKOTE ER) 250 MG 24 hr tablet Take 3 tablets (750 mg total) by mouth at bedtime. 10/19/19   Money, Gerlene Burdock, FNP  donepezil (ARICEPT) 10 MG tablet Take 10 mg by mouth daily. 09/13/19   [provider]  glipiZIDE (GLUCOTROL XL) 10 MG 24 hr tablet Take 10 mg by mouth 2 (two) times daily.    [provider]  INVEGA 9 MG 24 hr tablet Take 1 tablet (9 mg total) by mouth every morning. 10/19/19   Money, Gerlene Burdock, FNP  levothyroxine (SYNTHROID, LEVOTHROID) 50 MCG tablet Take 50 mcg by mouth daily.     [provider]  metFORMIN (FORTAMET) 1000 MG (OSM) 24 hr tablet Take 1 tablet (1,000 mg total) by mouth 2 (two) times daily with a meal. 10/19/19   Money, Lowry Ram, FNP  simvastatin (ZOCOR) 20 MG tablet Take 20 mg by mouth at bedtime.     [provider]  sitaGLIPtin (JANUVIA) 100 MG tablet Take 100 mg by mouth daily.    [provider]  VENTOLIN HFA 108 (90 Base) MCG/ACT inhaler Inhale 2 puffs into the  lungs every 4 (four) hours as needed. 06/22/19   [provider]   Physical Exam: Blood pressure 114/70, pulse (!) 124, temperature 97.9 F (36.6 C), temperature source Oral, resp. rate 17, height 5\' 8"  (1.727 m), weight 60.3 kg, SpO2 100 %. Vitals:   02/22/20 0908 02/23/20 0616  BP: (!) 119/91 114/70  Pulse: (!) 101 (!) 124  Resp:  17  Temp:  97.9 F (36.6 C)  SpO2:  100%     General: Appears comfortable and in no distress  Eyes: No conjunctival pallor  ENT: Moist mucous membranes  Neck: Supple no JVD  Cardiovascular: RRR, S1,S2  Respiratory: Bilateral air entry  Abdomen: Bowel sounds are present, soft nontender  Skin: Warm and dry  Musculoskeletal: Within normal limits  Psychiatric: Flat affect  Neurologic: Grossly intact  Labs on Admission:  Basic Metabolic Panel: Recent Labs  Lab 02/20/20 1523  NA 131*  K 3.7  CL 94*  CO2 25  GLUCOSE 245*  BUN 24*  CREATININE 1.41*  CALCIUM 9.3   Liver Function Tests: Recent Labs  Lab 02/20/20 1523  AST 53*  ALT 28  ALKPHOS 82  BILITOT 1.0  PROT 8.0  ALBUMIN 4.1   No results for input(s): LIPASE, AMYLASE in the last 168 hours. No results for input(s): AMMONIA in the last 168 hours. CBC: Recent Labs  Lab 02/20/20 1523  WBC 5.3  HGB 13.6  HCT 38.5*  MCV 83.5  PLT 237   Cardiac Enzymes: No results for input(s): CKTOTAL, CKMB, CKMBINDEX, TROPONINI in the last 168 hours. BNP: Invalid input(s): POCBNP CBG: Recent Labs  Lab 02/22/20 0702 02/22/20 1147 02/22/20 1613 02/23/20 0740 02/23/20 1139  GLUCAP 156* 224* 198* 292* 247*    Radiological Exams on Admission: No results found.  EKG: Independently reviewed.   Time spent: 64  Elnor Renovato [facility] Pager [phone]  If 7PM-7AM, please contact night-coverage [email or web address] Password [facility] 02/23/2020, 1:06 PM

## 2020-02-23 NOTE — Progress Notes (Signed)
D- Patient alert and oriented. Affect/mood is calm and cooperative. Patient denies SI, HI, AVH, and pain. Patients is looking forward to discharge.   A- Scheduled medications administered to patient, per MD orders. Support and encouragement provided.  Routine safety checks conducted every 15 minutes.  Patient informed to notify staff with problems or concerns.  R- No adverse drug reactions noted. Patient contracts for safety at this time. Patient compliant with medications and treatment plan. Patient receptive, calm, and cooperative. Patient interacts well with others on the unit.  Patient remains safe at this time.  Torrie Mayers RN

## 2020-02-23 NOTE — B[facility] Group Notes (Signed)
LCSW Group Therapy Note  02/23/2020 2:47 PM  Type of Therapy/Topic:  Group Therapy:  Balance in Life  Participation Level:  Minimal  Description of Group:    This group will address the concept of balance and how it feels and looks when one is unbalanced. Patients will be encouraged to process areas in their lives that are out of balance and identify reasons for remaining unbalanced. Facilitators will guide patients in utilizing problem-solving interventions to address and correct the stressor making their life unbalanced. Understanding and applying boundaries will be explored and addressed for obtaining and maintaining a balanced life. Patients will be encouraged to explore ways to assertively make their unbalanced needs known to significant others in their lives, using other group members and facilitator for support and feedback.  Therapeutic Goals: 1. Patient will identify two or more emotions or situations they have that consume much of in their lives. 2. Patient will identify signs/triggers that life has become out of balance:  3. Patient will identify two ways to set boundaries in order to achieve balance in their lives:  4. Patient will demonstrate ability to communicate their needs through discussion and/or role plays  Summary of Patient Progress: Patient was present in group. Patient was an active participant, however, later put his head down and appeared to leave group angrily throwing away the handout.  No one had said anything to patient prior to trigger the patient that this CSW is aware of.  Patient did share that depression leads to worrying, anxiety and making poor decisions.  He also reports that depression leads to depression "cutting people off and putting family against one another".  Therapeutic Modalities:   Cognitive Behavioral Therapy Solution-Focused Therapy Assertiveness Training  Penni Homans MSW, [place] 02/23/2020 2:47 PM

## 2020-02-23 NOTE — Progress Notes (Signed)
Recreation Therapy Notes  Date: 02/23/2020  Time: 9:30 am   Location: Room 21   Behavioral response: N/A   Intervention Topic: Animal Assisted therapy    Discussion/Intervention: Patient did not attend group.   Clinical Observations/Feedback:  Patient did not attend group.   Authur Cubit LRT/CTRS        Wendel Homeyer 02/23/2020 12:46 PM

## 2020-02-23 NOTE — Plan of Care (Signed)
  Problem: Education: Goal: Knowledge of [facility] Education information/materials will improve Outcome: Progressing Goal: Emotional status will improve Outcome: Progressing Goal: Mental status will improve Outcome: Progressing Goal: Verbalization of understanding the information provided will improve Outcome: Progressing   Problem: Activity: Goal: Interest or engagement in activities will improve Outcome: Progressing Goal: Sleeping patterns will improve Outcome: Progressing   Problem: Coping: Goal: Ability to verbalize frustrations and anger appropriately will improve Outcome: Progressing Goal: Ability to demonstrate self-control will improve Outcome: Progressing   Problem: Health Behavior/Discharge Planning: Goal: Identification of resources available to assist in meeting health care needs will improve Outcome: Progressing Goal: Compliance with treatment plan for underlying cause of condition will improve Outcome: Progressing   Problem: Physical Regulation: Goal: Ability to maintain clinical measurements within normal limits will improve Outcome: Progressing   Problem: Safety: Goal: Periods of time without injury will increase Outcome: Progressing   Problem: Activity: Goal: Will verbalize the importance of balancing activity with adequate rest periods Outcome: Progressing   Problem: Education: Goal: Will be free of psychotic symptoms Outcome: Progressing Goal: Knowledge of the prescribed therapeutic regimen will improve Outcome: Progressing   Problem: Coping: Goal: Coping ability will improve Outcome: Progressing Goal: Will verbalize feelings Outcome: Progressing   Problem: Health Behavior/Discharge Planning: Goal: Compliance with prescribed medication regimen will improve Outcome: Progressing   Problem: Nutritional: Goal: Ability to achieve adequate nutritional intake will improve Outcome: Progressing   Problem: Role Relationship: Goal:  Ability to communicate needs accurately will improve Outcome: Progressing Goal: Ability to interact with others will improve Outcome: Progressing   Problem: Safety: Goal: Ability to redirect hostility and anger into socially appropriate behaviors will improve Outcome: Progressing Goal: Ability to remain free from injury will improve Outcome: Progressing   Problem: Self-Care: Goal: Ability to participate in self-care as condition permits will improve Outcome: Progressing   Problem: Self-Concept: Goal: Will verbalize positive feelings about self Outcome: Progressing   

## 2020-02-23 NOTE — Progress Notes (Signed)
Patient is quiet and reserved. He is active on the unit interacting well with others in the milieu.  He is pleasant and cooperative in his encounter. He denies SI/HI/AVH depression and anxiety at this encounter. He received his prescribed meds and tolerated without incident. He remains safe with 15 minute safety checks. Informed to contact staff with concerns.

## 2020-02-23 NOTE — Progress Notes (Signed)
Inpatient Diabetes Program Recommendations  AACE/ADA: New Consensus Statement on Inpatient Glycemic Control (2015)  Target Ranges:  Prepandial:   less than 140 mg/dL      Peak postprandial:   less than 180 mg/dL (1-2 hours)      Critically ill patients:  140 - 180 mg/dL   Lab Results  Component Value Date   GLUCAP 247 (H) 02/23/2020   HGBA1C 9.0 (H) 02/20/2020    Review of Glycemic Control Results for Justin Weeks, Justin Weeks (MRN [id number]) as of 02/23/2020 13:45  Ref. Range 02/22/2020 07:02 02/22/2020 11:47 02/22/2020 16:13 02/23/2020 07:40 02/23/2020 11:39  Glucose-Capillary Latest Ref Range: 70 - 99 mg/dL 742 (H) 595 (H) 638 (H) 292 (H) 247 (H)   Diabetes history: DM 2 Outpatient Diabetes medications:  Glipizide 10 mg bid, Metformin 1000 mg bid Januvia 100 mg daily  Current orders for Inpatient glycemic control:  Novolog sensitive tid with meals  Glipizide XL 10 mg daily Tradjenta 5 mg daily Metformin 1000 mg bid  Inpatient Diabetes Program Recommendations:    Note referral by hospitalist as well.  Not sure that education is appropriate at this time?  A1C indicates average blood sugars of 180 mg/dL at home.  It does appear that patient was getting Glipizide 2 times a day at home.  May want to add Glpizide with evening meal as well?   Will follow.   Thanks  Beryl Meager, RN, BC-ADM Inpatient Diabetes Coordinator Pager [phone] (8a-5p)

## 2020-02-23 NOTE — BHH Counselor (Signed)
CSW spoke with pt's sister Stanton Kidney.  Per sister she would like for the patient to be discharged to her home due to his apartment needing to be sprayed for bed bugs. She reports that she will pick pt up at discharge.   Penni Homans, MSW, LCSW 02/23/2020 3:47 PM

## 2020-02-24 DIAGNOSIS — E1165 Type 2 diabetes mellitus with hyperglycemia: Secondary | ICD-10-CM

## 2020-02-24 LAB — GLUCOSE, CAPILLARY
Glucose-Capillary: 200 mg/dL — ABNORMAL HIGH (ref 70–99)
Glucose-Capillary: 156 mg/dL — ABNORMAL HIGH (ref 70–99)

## 2020-02-24 MED ORDER — DIVALPROEX SODIUM ER 250 MG PO TB24: 750.0000 mg | ORAL_TABLET | Freq: Every day | ORAL | 1 refills | Status: AC

## 2020-02-24 MED ORDER — SITAGLIPTIN PHOSPHATE 100 MG PO TABS: 100.0000 mg | ORAL_TABLET | Freq: Every day | ORAL | 1 refills | Status: AC

## 2020-02-24 MED ORDER — CLONAZEPAM 0.5 MG PO TABS: 0.5000 mg | ORAL_TABLET | Freq: Two times a day (BID) | ORAL | 1 refills | Status: DC

## 2020-02-24 MED ORDER — PALIPERIDONE ER 9 MG PO TB24: 9.0000 mg | ORAL_TABLET | Freq: Every day | ORAL | 1 refills | Status: DC

## 2020-02-24 MED ORDER — GLIPIZIDE ER 10 MG PO TB24: 10.0000 mg | ORAL_TABLET | Freq: Every day | ORAL | 1 refills | Status: AC

## 2020-02-24 MED ORDER — TEMAZEPAM 15 MG PO CAPS: 15.0000 mg | ORAL_CAPSULE | Freq: Every evening | ORAL | 1 refills | Status: DC | PRN

## 2020-02-24 MED ORDER — PALIPERIDONE PALMITATE ER 156 MG/ML IM SUSY: 156.0000 mg | PREFILLED_SYRINGE | INTRAMUSCULAR | 1 refills | Status: DC

## 2020-02-24 MED ORDER — DIVALPROEX SODIUM ER 250 MG PO TB24: 750.0000 mg | ORAL_TABLET | Freq: Every day | ORAL | 1 refills | Status: DC

## 2020-02-24 MED ORDER — DONEPEZIL HCL 10 MG PO TABS: 10.0000 mg | ORAL_TABLET | Freq: Every day | ORAL | 1 refills | Status: DC

## 2020-02-24 MED ORDER — GLIPIZIDE ER 10 MG PO TB24: 10.0000 mg | ORAL_TABLET | Freq: Every day | ORAL | 1 refills | Status: DC

## 2020-02-24 MED ORDER — SITAGLIPTIN PHOSPHATE 100 MG PO TABS: 100.0000 mg | ORAL_TABLET | Freq: Every day | ORAL | 1 refills | Status: DC

## 2020-02-24 MED ORDER — SIMVASTATIN 20 MG PO TABS: 20.0000 mg | ORAL_TABLET | Freq: Every day | ORAL | 1 refills | Status: AC

## 2020-02-24 MED ORDER — PALIPERIDONE PALMITATE ER 156 MG/ML IM SUSY: 156.0000 mg | PREFILLED_SYRINGE | INTRAMUSCULAR | Status: DC

## 2020-02-24 MED ORDER — SIMVASTATIN 20 MG PO TABS: 20.0000 mg | ORAL_TABLET | Freq: Every day | ORAL | 1 refills | Status: DC

## 2020-02-24 MED ORDER — LEVOTHYROXINE SODIUM 50 MCG PO TABS: 50.0000 ug | ORAL_TABLET | Freq: Every day | ORAL | 1 refills | Status: AC

## 2020-02-24 MED ORDER — DONEPEZIL HCL 10 MG PO TABS: 10.0000 mg | ORAL_TABLET | Freq: Every day | ORAL | 1 refills | Status: AC

## 2020-02-24 MED ORDER — LEVOTHYROXINE SODIUM 50 MCG PO TABS: 50.0000 ug | ORAL_TABLET | Freq: Every day | ORAL | 1 refills | Status: DC

## 2020-02-24 MED ORDER — METFORMIN HCL ER (OSM) 1000 MG PO TB24: 1000.0000 mg | ORAL_TABLET | Freq: Two times a day (BID) | ORAL | 1 refills | Status: DC

## 2020-02-24 MED ORDER — DIVALPROEX SODIUM ER 500 MG PO TB24: 500.0000 mg | ORAL_TABLET | Freq: Every morning | ORAL | 1 refills | Status: AC

## 2020-02-24 MED ORDER — DIVALPROEX SODIUM ER 500 MG PO TB24: 500.0000 mg | ORAL_TABLET | Freq: Every morning | ORAL | 1 refills | Status: DC

## 2020-02-24 MED ORDER — METFORMIN HCL ER (OSM) 1000 MG PO TB24: 1000.0000 mg | ORAL_TABLET | Freq: Two times a day (BID) | ORAL | 1 refills | Status: AC

## 2020-02-24 NOTE — Progress Notes (Signed)
Patient in and out of bed first part of shift. Denies SI, HI, AVH. Reports ready to go home. Easily redirectable. Medication compliant. Appropriate with staff and peers. Encouragement and support provided. Safety checks maintained. Medications given as prescribed. Pt receptive and remains safe on unit with q 15 min checks.

## 2020-02-24 NOTE — Progress Notes (Signed)
D- Patient alert and oriented. Patient presents in a pleasant mood on assessment stating that he slept alright last night and had no complaints to voice to this Clinical research associate. Patient denies SI, HI, AVH, and pain at this time. Patient also denies any signs/symptoms of depression/anxiety, stating that he his feeling "alright" overall. Patient's goal for today is "getting some future".  A- Scheduled medications administered to patient, per MD orders. Support and encouragement provided.  Routine safety checks conducted every 15 minutes.  Patient informed to notify staff with problems or concerns.  R- No adverse drug reactions noted. Patient contracts for safety at this time. Patient compliant with medications and treatment plan. Patient receptive, calm, and cooperative. Patient interacts well with others on the unit.  Patient remains safe at this time.

## 2020-02-24 NOTE — Progress Notes (Signed)
Recreation Therapy Notes  Date: 02/24/2020  Time: 9:30 am   Location: Craft room    Behavioral response: N/A   Intervention Topic: Stress Management   Discussion/Intervention: Patient did not attend group.   Clinical Observations/Feedback:  Patient did not attend group.   Daanya Lanphier LRT/CTRS          Adalyna Godbee 02/24/2020 11:34 AM

## 2020-02-24 NOTE — Discharge Summary (Signed)
Physician Discharge Summary Note  Patient:  Justin Weeks is an 59 y.o., male MRN:  [id number] DOB:  04-23-1961 Patient phone:  [phone] (home)  Patient address:   [address] [place] [address],  Total Time spent with patient: 30 minutes  Date of Admission:  02/21/2020 Date of Discharge: 02/24/2020  Reason for Admission: Admitted because of worsening psychosis agitation wandering away from home poor self-care concerned about people involved in his care for his ability to live independently  Principal Problem: Schizophrenia [facility]) Discharge Diagnoses: Principal Problem:   Schizophrenia (HCC) Active Problems:   Diabetes (HCC)   Noncompliance   Hypothyroidism   HTN (hypertension)   Dyslipidemia   Past Psychiatric History: Longstanding history of schizophrenia largely controlled with outpatient treatment with an act team.  Past Medical History:  Past Medical History:  Diagnosis Date  . Arthritis   . Asthma   . Blood transfusion without reported diagnosis   . Diabetes mellitus without complication (HCC)   . Hypertension   . Hypothyroid 07/03/2015  . Personal history of tobacco use, presenting hazards to health 05/31/2015  . Schizoaffective disorder (HCC)   . Seizures (HCC)    History reviewed. No pertinent surgical history. Family History: History reviewed. No pertinent family history. Family Psychiatric  History: See previous Social History:  Social History   Substance and Sexual Activity  Alcohol Use No     Social History   Substance and Sexual Activity  Drug Use Yes  . Types: Cocaine    Social History   Socioeconomic History  . Marital status: Divorced    Spouse name: Not on file  . Number of children: Not on file  . Years of education: Not on file  . Highest education level: Not on file  Occupational History  . Not on file  Tobacco Use  . Smoking status: Current Every Day Smoker    Packs/day: 1.00    Years: 40.00    Pack years: 40.00     Types: Cigarettes  . Smokeless tobacco: Never Used  Vaping Use  . Vaping Use: Unknown  Substance and Sexual Activity  . Alcohol use: No  . Drug use: Yes    Types: Cocaine  . Sexual activity: Yes  Other Topics Concern  . Not on file  Social History Narrative  . Not on file   Social Determinants of Health   Financial Resource Strain:   . Difficulty of Paying Living Expenses:   Food Insecurity:   . Worried About Programme researcher, broadcasting/film/video in the Last Year:   . Barista in the Last Year:   Transportation Needs:   . Freight forwarder (Medical):   Marland Kitchen Lack of Transportation (Non-Medical):   Physical Activity:   . Days of Exercise per Week:   . Minutes of Exercise per Session:   Stress:   . Feeling of Stress :   Social Connections:   . Frequency of Communication with Friends and Family:   . Frequency of Social Gatherings with Friends and Family:   . Attends Religious Services:   . Active Member of Clubs or Organizations:   . Attends Banker Meetings:   Marland Kitchen Marital Status:     Hospital Course: Patient was admitted to the psychiatric ward.  15-minute checks employed.  He showed no dangerous aggressive or violent behavior on the unit.  He was cooperative and pleasant with treatment.  Patient was treated with Invega and Depakote.  Depakote was slightly  increased because of a low blood level.  He tolerated medicines well.  Mood improved substantially once he was back on his medicine.  He was able to engage appropriately in conversation and treatment with other patients.  Showed better insight.  Patient's blood sugars were running somewhat high during his hospitalization.  He was seen by internal medicine.  Appreciate their input.  He was given education about diet and diabetes management.  Continue outpatient follow-up with long-acting injectable and Depakote.  Physical Findings: AIMS: Facial and Oral Movements Muscles of Facial Expression: None, normal Lips and Perioral  Area: None, normal Jaw: None, normal Tongue: None, normal,Extremity Movements Upper (arms, wrists, hands, fingers): None, normal Lower (legs, knees, ankles, toes): None, normal, Trunk Movements Neck, shoulders, hips: None, normal, Overall Severity Severity of abnormal movements (highest score from questions above): None, normal Incapacitation due to abnormal movements: None, normal Patient's awareness of abnormal movements (rate only patient's report): No Awareness, Dental Status Current problems with teeth and/or dentures?: No Does patient usually wear dentures?: No  CIWA:    COWS:     Musculoskeletal: Strength & Muscle Tone: within normal limits Gait & Station: normal Patient leans: N/A  Psychiatric Specialty Exam: Physical Exam  Nursing note and vitals reviewed. Constitutional: He appears well-developed.  HENT:  Head: Normocephalic and atraumatic.  Eyes: Pupils are equal, round, and reactive to light. Conjunctivae are normal.  Cardiovascular: Normal heart sounds.  Respiratory: Effort normal.  GI: Soft.  Musculoskeletal:        General: Normal range of motion.     Cervical back: Normal range of motion.  Neurological: He is alert.  Skin: Skin is warm and dry.  Psychiatric: Mood normal.    Review of Systems  Constitutional: Negative.   HENT: Negative.   Eyes: Negative.   Respiratory: Negative.   Cardiovascular: Negative.   Gastrointestinal: Negative.   Musculoskeletal: Negative.   Skin: Negative.   Neurological: Negative.   Psychiatric/Behavioral: Negative.     Blood pressure 129/78, pulse 98, temperature 97.9 F (36.6 C), temperature source Oral, resp. rate 17, height 5\' 8"  (1.727 m), weight 60.3 kg, SpO2 95 %.Body mass index is 20.22 kg/m.  General Appearance: Casual  Eye Contact:  Good  Speech:  Clear and Coherent  Volume:  Normal  Mood:  Euthymic  Affect:  Congruent  Thought Process:  Goal Directed  Orientation:  Full (Time, Place, and Person)  Thought  Content:  Logical  Suicidal Thoughts:  No  Homicidal Thoughts:  No  Memory:  Immediate;   Fair Recent;   Fair Remote;   Fair  Judgement:  Fair  Insight:  Fair  Psychomotor Activity:  Normal  Concentration:  Concentration: Fair  Recall:  of Knowledge:  Fair  Language:  Fair  Akathisia:  No  Handed:  Right  AIMS (if indicated):     Assets:  Desire for Improvement Housing Resilience Social Support  ADL's:  Intact  Cognition:  WNL  Sleep:  Number of Hours: 5.75     Have you used any form of tobacco in the last 30 days? (Cigarettes, Smokeless Tobacco, Cigars, and/or Pipes): Yes  Has this patient used any form of tobacco in the last 30 days? (Cigarettes, Smokeless Tobacco, Cigars, and/or Pipes) Yes, No  Blood Alcohol level:  Lab Results  Component Value Date   [facility] <10 02/20/2020   ETH <10 10/17/2019    Metabolic Disorder Labs:  Lab Results  Component Value Date   HGBA1C 9.0 (H) 02/20/2020  MPG 212 02/20/2020   Lab Results  Component Value Date   PROLACTIN 21.9 (H) 10/18/2019   Lab Results  Component Value Date   CHOL 113 07/04/2015   TRIG 118 07/04/2015   HDL 30 (L) 07/04/2015   CHOLHDL 3.8 07/04/2015   VLDL 24 07/04/2015   LDLCALC 59 07/04/2015   LDLCALC 41 09/21/2014    See Psychiatric Specialty Exam and Suicide Risk Assessment completed by Attending Physician prior to discharge.  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        Medication List    STOP taking these medications   Ventolin HFA 108 (90 Base) MCG/ACT inhaler Generic drug: albuterol     TAKE these medications     Indication  clonazePAM 0.5 MG tablet Commonly known as: KLONOPIN Take 1 tablet (0.5 mg total) by mouth 2 (two) times daily.  Indication: Feeling Anxious   divalproex 500 MG 24 hr tablet Commonly known as: DEPAKOTE ER Take 1 tablet (500 mg total) by mouth every morning. What changed:   medication strength  how much to take  Indication: Schizoaffective   divalproex 250 MG 24 hr tablet Commonly known as: DEPAKOTE ER Take 3 tablets (750 mg total) by mouth at bedtime. What changed: Another medication with the same name was changed. Make sure you understand how and when to take each.  Indication: Schizoaffective   donepezil 10 MG tablet Commonly known as: ARICEPT Take 1 tablet (10 mg total) by mouth daily.  Indication: Cognitive impairment with schizophrenia   glipiZIDE 10 MG 24 hr tablet Commonly known as: GLUCOTROL XL Take 1 tablet (10 mg total) by mouth daily with breakfast. Start taking on: February 25, 2020 What changed: when to take this  Indication: Type 2 Diabetes   levothyroxine 50 MCG tablet Commonly known as: SYNTHROID Take 1 tablet (50 mcg total) by mouth daily at 6 (six) AM. Start taking on: February 25, 2020 What changed: when to take this  Indication: Underactive Thyroid   metformin 1000 MG (OSM) 24 hr tablet Commonly known as: FORTAMET Take 1 tablet (1,000 mg total) by mouth 2 (two) times daily with a meal.  Indication: Type 2 Diabetes   paliperidone 9 MG 24 hr tablet Commonly known as: INVEGA Take 1 tablet (9 mg total) by mouth daily. Start taking on: February 25, 2020 What changed: when to take this  Indication: Schizophrenia   simvastatin 20 MG tablet Commonly known as: ZOCOR Take 1 tablet (20 mg total) by mouth at bedtime.  Indication: High Amount of Fats in the Blood   sitaGLIPtin  100 MG tablet Commonly known as: JANUVIA Take 1 tablet (100 mg total) by mouth daily.  Indication: Type 2 Diabetes   temazepam 15 MG capsule Commonly known as: RESTORIL Take 1 capsule (15 mg total) by mouth at bedtime as needed for sleep.  Indication: Trouble Sleeping  Follow-up recommendations:  Activity:  Activity as tolerated Diet:  Diabetic diet Other:  Follow-up with act team  Comments: Prescriptions provided at discharge  Signed: Alethia Berthold, MD 02/24/2020, 9:36 AM

## 2020-02-24 NOTE — Plan of Care (Signed)
  Problem: Coping Skills Goal: STG - Patient will identify 3 positive coping skills strategies to use post d/c within 5 recreation therapy group sessions Description: STG - Patient will identify 3 positive coping skills strategies to use post d/c within 5 recreation therapy group sessions 02/24/2020 1404 by Alveria Apley, LRT Outcome: Adequate for Discharge 02/24/2020 1404 by Alveria Apley, LRT Outcome: Adequate for Discharge

## 2020-02-24 NOTE — B[facility] Suicide Risk Assessment (Signed)
[facility] Discharge Suicide Risk Assessment   Principal Problem: Schizophrenia [facility]) Discharge Diagnoses: Principal Problem:   Schizophrenia (HCC) Active Problems:   Diabetes (HCC)   Noncompliance   Hypothyroidism   HTN (hypertension)   Dyslipidemia   Total Time spent with patient: 30 minutes  Musculoskeletal: Strength & Muscle Tone: within normal limits Gait & Station: normal Patient leans: N/A  Psychiatric Specialty Exam: Review of Systems  Constitutional: Negative.   HENT: Negative.   Eyes: Negative.   Respiratory: Negative.   Cardiovascular: Negative.   Gastrointestinal: Negative.   Musculoskeletal: Negative.   Skin: Negative.   Neurological: Negative.   Psychiatric/Behavioral: Negative.     Blood pressure 129/78, pulse 98, temperature 97.9 F (36.6 C), temperature source Oral, resp. rate 17, height 5\' 8"  (1.727 m), weight 60.3 kg, SpO2 95 %.Body mass index is 20.22 kg/m.  General Appearance: Casual  Eye Contact::  Good  Speech:  Clear and Coherent409  Volume:  Decreased  Mood:  Euthymic  Affect:  Congruent  Thought Process:  Goal Directed  Orientation:  Full (Time, Place, and Person)  Thought Content:  Logical  Suicidal Thoughts:  No  Homicidal Thoughts:  No  Memory:  Immediate;   Fair Recent;   Fair Remote;   Fair  Judgement:  Fair  Insight:  Fair  Psychomotor Activity:  Normal  Concentration:  Fair  Recall:  [phone] of Knowledge:Fair  Language: Fair  Akathisia:  No  Handed:  Right  AIMS (if indicated):     Assets:  Desire for Improvement Housing Resilience Social Support  Sleep:  Number of Hours: 5.75  Cognition: WNL  ADL's:  Intact   Mental Status Per Nursing Assessment::   On Admission:  NA  Demographic Factors:  Male and Living alone  Loss Factors: NA  Historical Factors: NA  Risk Reduction Factors:   Sense of responsibility to family, Living with another person, especially a relative, Positive social support and Positive  therapeutic relationship  Continued Clinical Symptoms:  Schizophrenia:   Paranoid or undifferentiated type  Cognitive Features That Contribute To Risk:  None    Suicide Risk:  Minimal: No identifiable suicidal ideation.  Patients presenting with no risk factors but with morbid ruminations; may be classified as minimal risk based on the severity of the depressive symptoms    Plan Of Care/Follow-up recommendations:  Activity:  Activity as tolerated in Diet:  Diabetic diet Other:  Follow-up with outpatient act team  [phone], MD 02/24/2020, 9:29 AM

## 2020-02-24 NOTE — Plan of Care (Signed)
  Problem: Education: Goal: Mental status will improve Outcome: Progressing Goal: Verbalization of understanding the information provided will improve Outcome: Progressing   Problem: Coping: Goal: Ability to demonstrate self-control will improve Outcome: Progressing

## 2020-02-24 NOTE — Progress Notes (Signed)
Patient ID: Justin Weeks, male   DOB: 03/10/1961, 59 y.o.   MRN: [id number]  Discharge Note:  Patient denies SI/HI/AVH at this time. Discharge instructions, AVS, prescriptions, and transition record gone over with patient. Patient agrees to comply with medication management, follow-up visit, and outpatient therapy. Patient belongings returned to patient. Patient questions and concerns addressed and answered. Patient ambulatory off unit. Patient discharged to home via [employer], transportation services.

## 2020-02-24 NOTE — Progress Notes (Signed)
I reviewed patient's chart , discussed with attending physician Dr Toni Amend. Patient deemed stable to go home. Blood sugar control are sub optimal, however he is already on 3 oral hypoglycemics and not able / willing to start insulin regimen. Aggressive diet modification will be the next step and patient agreeable. Stable from medical stand point to discharge and schedule out patient follow up.  No face to face visit. No charge entry.

## 2020-02-24 NOTE — Progress Notes (Signed)
Recreation Therapy Notes  INPATIENT RECREATION TR PLAN  Patient Details Name: Justin Weeks MRN: [id number] DOB: 1961-03-28 Today's Date: 02/24/2020  Rec Therapy Plan Is patient appropriate for Therapeutic Recreation?: Yes Treatment times per week: at least 3 Estimated Length of Stay: 5-7 days TR Treatment/Interventions: Group participation (Comment)  Discharge Criteria Pt will be discharged from therapy if:: Discharged Treatment plan/goals/alternatives discussed and agreed upon by:: Patient/family  Discharge Summary Short term goals set: Patient will identify 3 positive coping skills strategies to use post d/c within 5 recreation therapy group sessions Short term goals met: Adequate for discharge Progress toward goals comments: Groups attended Which groups?: Self-esteem, Other (Comment) (Strengths) Reason goals not met: N/A Therapeutic equipment acquired: N/A Reason patient discharged from therapy: Discharge from hospital Pt/family agrees with progress & goals achieved: Yes Date patient discharged from therapy: 02/24/20   Keymiah Lyles 02/24/2020, 2:05 PM

## 2020-03-07 ENCOUNTER — Other Ambulatory Visit: Payer: Self-pay

## 2020-03-07 ENCOUNTER — Emergency Department
Admission: EM | Admit: 2020-03-07 | Discharge: 2020-03-09 | Disposition: A | Discharge status: Home / Self Care | Payer: Medicare Other | Attending: Emergency Medicine | Admitting: Emergency Medicine

## 2020-03-07 DIAGNOSIS — Z7984 Long term (current) use of oral hypoglycemic drugs: Secondary | ICD-10-CM | POA: Diagnosis not present

## 2020-03-07 DIAGNOSIS — I10 Essential (primary) hypertension: Secondary | ICD-10-CM | POA: Diagnosis not present

## 2020-03-07 DIAGNOSIS — J45909 Unspecified asthma, uncomplicated: Secondary | ICD-10-CM | POA: Diagnosis not present

## 2020-03-07 DIAGNOSIS — F17210 Nicotine dependence, cigarettes, uncomplicated: Secondary | ICD-10-CM | POA: Insufficient documentation

## 2020-03-07 DIAGNOSIS — E039 Hypothyroidism, unspecified: Secondary | ICD-10-CM | POA: Insufficient documentation

## 2020-03-07 DIAGNOSIS — F250 Schizoaffective disorder, bipolar type: Secondary | ICD-10-CM | POA: Diagnosis not present

## 2020-03-07 DIAGNOSIS — F209 Schizophrenia, unspecified: Secondary | ICD-10-CM

## 2020-03-07 DIAGNOSIS — Z79899 Other long term (current) drug therapy: Secondary | ICD-10-CM | POA: Insufficient documentation

## 2020-03-07 DIAGNOSIS — E119 Type 2 diabetes mellitus without complications: Secondary | ICD-10-CM | POA: Insufficient documentation

## 2020-03-07 DIAGNOSIS — Z20822 Contact with and (suspected) exposure to covid-19: Secondary | ICD-10-CM | POA: Insufficient documentation

## 2020-03-07 LAB — CBC
HCT: 37 % — ABNORMAL LOW (ref 39.0–52.0)
Hemoglobin: 13.2 g/dL (ref 13.0–17.0)
MCH: 29.9 pg (ref 26.0–34.0)
MCHC: 35.7 g/dL (ref 30.0–36.0)
MCV: 83.7 fL (ref 80.0–100.0)
Platelets: 201 10*3/uL (ref 150–400)
RBC: 4.42 MIL/uL (ref 4.22–5.81)
RDW: 12.8 % (ref 11.5–15.5)
WBC: 4.3 10*3/uL (ref 4.0–10.5)
nRBC: 0 % (ref 0.0–0.2)

## 2020-03-07 LAB — COMPREHENSIVE METABOLIC PANEL
ALT: 12 U/L (ref 0–44)
AST: 24 U/L (ref 15–41)
Albumin: 3.4 g/dL — ABNORMAL LOW (ref 3.5–5.0)
Alkaline Phosphatase: 37 U/L — ABNORMAL LOW (ref 38–126)
Anion gap: 18 — ABNORMAL HIGH (ref 5–15)
BUN: 11 mg/dL (ref 8–23)
CO2: 22 mmol/L (ref 22–32)
Calcium: <4 mg/dL — CL (ref 8.9–10.3)
Chloride: 98 mmol/L (ref 98–111)
Creatinine, Ser: 1.19 mg/dL (ref 0.61–1.24)
GFR calc Af Amer: >60 mL/min (ref >60)
GFR calc non Af Amer: >60 mL/min (ref >60)
Glucose, Bld: 100 mg/dL — ABNORMAL HIGH (ref 70–99)
Potassium: 4.9 mmol/L (ref 3.5–5.1)
Sodium: 138 mmol/L (ref 135–145)
Total Bilirubin: 1.1 mg/dL (ref 0.3–1.2)
Total Protein: 7.4 g/dL (ref 6.5–8.1)

## 2020-03-07 LAB — BASIC METABOLIC PANEL
Anion gap: 8 (ref 5–15)
BUN: 9 mg/dL (ref 8–23)
CO2: 29 mmol/L (ref 22–32)
Calcium: 9.3 mg/dL (ref 8.9–10.3)
Chloride: 101 mmol/L (ref 98–111)
Creatinine, Ser: 1.26 mg/dL — ABNORMAL HIGH (ref 0.61–1.24)
GFR calc Af Amer: >60 mL/min (ref >60)
GFR calc non Af Amer: >60 mL/min (ref >60)
Glucose, Bld: 206 mg/dL — ABNORMAL HIGH (ref 70–99)
Potassium: 4.4 mmol/L (ref 3.5–5.1)
Sodium: 138 mmol/L (ref 135–145)

## 2020-03-07 LAB — ACETAMINOPHEN LEVEL: Acetaminophen (Tylenol), Serum: <10 ug/mL — ABNORMAL LOW (ref 10–30)

## 2020-03-07 LAB — MAGNESIUM: Magnesium: 2 mg/dL (ref 1.7–2.4)

## 2020-03-07 LAB — ETHANOL: Alcohol, Ethyl (B): <10 mg/dL (ref <10)

## 2020-03-07 LAB — SALICYLATE LEVEL: Salicylate Lvl: <7 mg/dL — ABNORMAL LOW (ref 7.0–30.0)

## 2020-03-07 MED ORDER — CALCIUM GLUCONATE-NACL 1-0.675 GM/50ML-% IV SOLN
1.0000 g | Freq: Once | INTRAVENOUS | Status: AC
  Administered 2020-03-07: 1000 mg via INTRAVENOUS
  Filled 2020-03-07: qty 50

## 2020-03-07 MED ORDER — SODIUM CHLORIDE 0.9 % IV BOLUS
1000.0000 mL | Freq: Once | INTRAVENOUS | Status: AC
  Administered 2020-03-07: 1000 mL via INTRAVENOUS

## 2020-03-07 NOTE — BH Assessment (Signed)
Assessment Note  Justin Weeks is an 59 y.o. male who presents to [facility] ED involuntarily for treatment. Per triage note, Pt here via POV. Pt's chief complaint is behavioral med eval and pt states he is here to "get help with his appt". Pt unable to tell me exactly what he is in the ER for today but states "they are doing marijuana, drinking beer at my house and I dont want to be around it". Pt reports "there are bed bugs and they sprayed for them". Pt rambling on about someone taking his mattress and is not making sense to this RN. PT states they need to "bear with me because I am not going to stay there". This RN informed pt that he would need blood drawn and to be dressed out and pt states "I am not staying here, I just want to see the doctor".   During TTS assessment pt presents alert and oriented x 3, irritable but cooperative, disorganized thought process, some thought blocking and mood-congruent with affect. The pt does appear to be responding to internal and external stimuli. Neither is the pt presenting with any delusional thinking. Pt denied the information provided to the triage RN angrily stating, "I received a letter in the mail that told me to come here to get checked that's it". Pt was unclear to follow up questions regarding the letter but stated "It's for me to get my nursing aid". Pt reports becoming confused and upset upon his arrival after being unable to remember the contents of the letter and stated "So I started acting a fool". Pt reports current conflict with his living situation that was unclear. Pt reports to have an ACT team through [facility] but was unable to provide further information at this time. Pt reports SA (Cocaine) and a current sobriety period of 4 months. Pt denied any other SA outside of cocaine. Pt reports an INPT hx with [facility] and a MH hx of schizophrenia. Pt denied any family hx of MH or SA.  Pt abruptly yelled "I take medications for my diabetes but not schizophrenia  I don't think". Pt reports receiving a bubble pack from ACTT but was unclear regarding his current prescriptions and compliance. Pt denied any struggles with eating or sleeping and was unable to provide collateral information stating his ACT team number is "[phone]". Pt denies any current SI/HI/AH/VH.   Per Dr. Lucianne Muss pt is recommended to be reassessed in the morning pending collateral from ACT Team    Diagnosis: Per hx schizoaffective   Past Medical History:  Past Medical History:  Diagnosis Date  . Arthritis   . Asthma   . Blood transfusion without reported diagnosis   . Diabetes mellitus without complication (HCC)   . Hypertension   . Hypothyroid 07/03/2015  . Personal history of tobacco use, presenting hazards to health 05/31/2015  . Schizoaffective disorder (HCC)   . Seizures (HCC)     History reviewed. No pertinent surgical history.  Family History: History reviewed. No pertinent family history.  Social History:  reports that he has been smoking cigarettes. He has a 40.00 pack-year smoking history. He has never used smokeless tobacco. He reports current drug use. Drug: Cocaine. He reports that he does not drink alcohol.  Additional Social History:  Alcohol / Drug Use Pain Medications: see mar Prescriptions: see mar Over the Counter: see mar History of alcohol / drug use?: Yes Substance #1 Name of Substance 1: Cocaine  CIWA: CIWA-Ar BP: 125/74 Pulse Rate: 96  COWS:    Allergies:  Allergies  Allergen Reactions  . 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      Home Medications: (Not in a hospital admission)   OB/GYN Status:  No LMP for male patient.  General Assessment Data Location of Assessment: [facility] ED TTS Assessment: In system Is this a Tele or Face-to-Face Assessment?: Face-to-Face Is this an Initial Assessment or a Re-assessment for this encounter?: Initial  Assessment Patient Accompanied by:: N/A Language Other than English: No Living Arrangements: Other (Comment) (Private home ) What gender do you identify as?: Male Marital status: Single Maiden name: n/a Pregnancy Status: No Living Arrangements: Alone Can pt return to current living arrangement?: Yes Admission Status: Involuntary Petitioner: ED Attending Is patient capable of signing voluntary admission?: Yes Referral Source: Self/Family/Friend Insurance type: Science writer Exam [facility] Walk-in ONLY) Medical Exam completed: Yes  Crisis Care Plan Living Arrangements: Alone Legal Guardian:  (self) Name of Psychiatrist: Pt reports [employer] Act Team  Name of Therapist: Pt reports Hydrographic surveyor Status Is patient currently in school?: No Is the patient employed, unemployed or receiving disability?: Receiving disability income  Risk to self with the past 6 months Suicidal Ideation: No Has patient been a risk to self within the past 6 months prior to admission? : No Suicidal Intent: No Has patient had any suicidal intent within the past 6 months prior to admission? : No Is patient at risk for suicide?: No Suicidal Plan?: No Has patient had any suicidal plan within the past 6 months prior to admission? : No Access to Means: No What has been your use of drugs/alcohol within the last 12 months?: Cocaine  Previous Attempts/Gestures: Yes How many times?: 1 Other Self Harm Risks: None reported  Triggers for Past Attempts: Unknown Intentional Self Injurious Behavior: None Family Suicide History: Unable to assess Recent stressful life event(s): Conflict (Comment) (Home ) Persecutory voices/beliefs?: No Depression: No Depression Symptoms: Feeling angry/irritable Substance abuse history and/or treatment for substance abuse?: No Suicide prevention information given to non-admitted patients: Not applicable  Risk to Others within the past 6  months Homicidal Ideation: No Does patient have any lifetime risk of violence toward others beyond the six months prior to admission? : No Thoughts of Harm to Others: No Current Homicidal Intent: No Current Homicidal Plan: No Access to Homicidal Means: No Identified Victim:  (n/a) History of harm to others?: No Assessment of Violence: On admission Violent Behavior Description: Verbal aggression  Does patient have access to weapons?: No Criminal Charges Pending?: No Does patient have a court date: No Is patient on probation?: Unknown  Psychosis Hallucinations: None noted Delusions: None noted  Mental Status Report Appearance/Hygiene: Unremarkable Eye Contact: Fair Motor Activity: Freedom of movement Speech: Rapid, Loud, Tangential Level of Consciousness: Alert Mood: Anxious, Irritable, Pleasant Affect: Anxious, Irritable Anxiety Level: Minimal Thought Processes: Relevant, Tangential, Thought Blocking Judgement: Partial Orientation: Appropriate for developmental age Obsessive Compulsive Thoughts/Behaviors: None  Cognitive Functioning Concentration: Fair Memory: Recent Intact, Remote Intact Is patient IDD: No Insight: Poor Impulse Control: Poor Appetite: Good Have you had any weight changes? : No Change Sleep: No Change Total Hours of Sleep: 8 Vegetative Symptoms: None     Prior Inpatient Therapy Prior Inpatient Therapy: Yes Prior Therapy Dates: 10/17/19 Prior Therapy Facilty/Provider(s): [facility] Reason for Treatment: MH & SI  Prior Outpatient Therapy Prior Outpatient Therapy: Yes  Prior Therapy Dates: current  Prior Therapy Facilty/Provider(s): Pt reports [facility] Reason for Treatment: MH Does patient have an ACCT team?: Unknown (Pt reports but currenty unclear ) Does patient have Intensive In-House Services?  : Yes Does patient have [facility] services? : Unknown Does patient have P4CC services?: Unknown  ADL Screening (condition at time of admission) Is the  patient deaf or have difficulty hearing?: No Does the patient have difficulty seeing, even when wearing glasses/contacts?: No Does the patient have difficulty concentrating, remembering, or making decisions?: No Does the patient have difficulty dressing or bathing?: No Does the patient have difficulty walking or climbing stairs?: No Weakness of Legs: None Weakness of Arms/Hands: None  Home Assistive Devices/Equipment Home Assistive Devices/Equipment: None  Therapy Consults (therapy consults require a physician order) PT Evaluation Needed: No OT Evalulation Needed: No SLP Evaluation Needed: No Abuse/Neglect Assessment (Assessment to be complete while patient is alone) Abuse/Neglect Assessment Can Be Completed: Yes Physical Abuse: Denies Verbal Abuse: Denies Sexual Abuse: Denies Exploitation of patient/patient's resources: Denies Self-Neglect: Denies Values / Beliefs Cultural Requests During Hospitalization: None Spiritual Requests During Hospitalization: None Consults Spiritual Care Consult Needed: No Transition of Care Team Consult Needed: No Advance Directives (For Healthcare) Does Patient Have a Medical Advance Directive?: No          Disposition:  Disposition Initial Assessment Completed for this Encounter: Yes Patient referred to: Other (Comment)  On Site Evaluation by:   Reviewed with Physician:    Opal Sidles 03/07/2020 6:01 PM

## 2020-03-07 NOTE — ED Notes (Addendum)
Pt here via POV. Pt's chief complaint is behavioral med eval and pt states he is here to "get help with his appt". Pt unable to tell me exactly what he is in the ER for today but states "they are doing marijuana, drinking beer at my house and I dont want to be around it". Pt reports "there are bed bugs and they sprayed for them". Pt rambling on about someone taking his mattress and is not making sense to this RN. PT states they need to "bear with me because I am not going to stay there". This RN informed pt that he would need blood drawn and to be dressed out and pt states "I am not staying here, I just want to see the doctor". First nurse Erskine Squibb informed, pt taken to room 22

## 2020-03-07 NOTE — ED Notes (Signed)
Report off to david rn 

## 2020-03-07 NOTE — ED Notes (Signed)
Report to include Situation, Background, Assessment, and Recommendations received from RN. Patient alert and oriented, warm and dry, in no acute distress. Patient denies SI, HI, AVH and pain. Patient made aware of Q15 minute rounds and Rover and Officer presence for their safety. Patient instructed to come to me with needs or concerns.   

## 2020-03-07 NOTE — ED Notes (Signed)
Pt given ginger ale.

## 2020-03-07 NOTE — ED Notes (Signed)
Hourly rounding reveals patient in room. No complaints, stable, in no acute distress. Q15 minute rounds and monitoring via Rover and Officer to continue.   

## 2020-03-07 NOTE — ED Notes (Signed)
IVC 

## 2020-03-07 NOTE — ED Provider Notes (Addendum)
[facility] Emergency Department Provider Note  ____________________________________________   First MD Initiated Contact with Patient 03/07/20 1132     (approximate)  I have reviewed the triage vital signs and the nursing notes.   HISTORY  Chief Complaint Psychiatric Evaluation    HPI Justin Weeks is a 59 y.o. male with schizophrenia who comes in for psychiatric evaluation.  To note patient was recently discharged on 6/25 for psychosis and agitation with poor self-care.  Patient comes back in today.  Patient is not able to tell me exactly why he is here today.  He reports that he has psychiatric illness and that he is on medicine but is unable to tell me what medicine or what psychiatric illness.  He states that he forgot his papers.  Patient is rambling on and I do not completely follow what he is saying.  He reports that somebody in the mail told him to come to the ER to be evaluated.  According to triage a friend dropped him off.  At this time I cannot get full HPI due to psychiatric illness.  Patient is attempting to leave the room but given patient is not completely making sense to me I fear that he may be decompensated from his schizophrenia and will require IVC for psychiatric evaluation.   Past Medical History:  Diagnosis Date  . Arthritis   . Asthma   . Blood transfusion without reported diagnosis   . Diabetes mellitus without complication (HCC)   . Hypertension   . Hypothyroid 07/03/2015  . Personal history of tobacco use, presenting hazards to health 05/31/2015  . Schizoaffective disorder (HCC)   . Seizures [facility])     Patient Active Problem List   Diagnosis Date Noted  . Schizophrenia (HCC) 02/21/2020  . Schizoaffective disorder (HCC) 10/17/2019  . Suicidal ideation   . Schizoaffective disorder, bipolar type (HCC) 07/04/2015  . HTN (hypertension) 07/04/2015  . Dyslipidemia 07/04/2015  . Tobacco use disorder 07/04/2015  . Noncompliance  07/03/2015  . Hypothyroidism 07/03/2015  . Diabetes (HCC) 04/20/2015    History reviewed. No pertinent surgical history.  Prior to Admission medications   Medication Sig Start Date End Date Taking? Authorizing Provider  clonazePAM (KLONOPIN) 0.5 MG tablet Take 1 tablet (0.5 mg total) by mouth 2 (two) times daily. 02/24/20   Clapacs, Jackquline Denmark, MD  divalproex (DEPAKOTE ER) 250 MG 24 hr tablet Take 3 tablets (750 mg total) by mouth at bedtime. 02/24/20   Clapacs, Jackquline Denmark, MD  divalproex (DEPAKOTE ER) 500 MG 24 hr tablet Take 1 tablet (500 mg total) by mouth every morning. 02/24/20   Clapacs, Jackquline Denmark, MD  donepezil (ARICEPT) 10 MG tablet Take 1 tablet (10 mg total) by mouth daily. 02/24/20   Clapacs, Jackquline Denmark, MD  glipiZIDE (GLUCOTROL XL) 10 MG 24 hr tablet Take 1 tablet (10 mg total) by mouth daily with breakfast. 02/25/20   Clapacs, Jackquline Denmark, MD  levothyroxine (SYNTHROID) 50 MCG tablet Take 1 tablet (50 mcg total) by mouth daily at 6 (six) AM. 02/25/20   Clapacs, Jackquline Denmark, MD  metformin (FORTAMET) 1000 MG (OSM) 24 hr tablet Take 1 tablet (1,000 mg total) by mouth 2 (two) times daily with a meal. 02/24/20   Clapacs, Jackquline Denmark, MD  paliperidone (INVEGA SUSTENNA) 156 MG/ML SUSY injection Inject 1 mL (156 mg total) into the muscle every 28 (twenty-eight) days. 02/27/20   Clapacs, Jackquline Denmark, MD  paliperidone (INVEGA) 9 MG 24 hr tablet Take 1  tablet (9 mg total) by mouth daily. 02/25/20   Clapacs, Jackquline Denmark, MD  simvastatin (ZOCOR) 20 MG tablet Take 1 tablet (20 mg total) by mouth at bedtime. 02/24/20   Clapacs, Jackquline Denmark, MD  sitaGLIPtin (JANUVIA) 100 MG tablet Take 1 tablet (100 mg total) by mouth daily. 02/24/20   Clapacs, Jackquline Denmark, MD  temazepam (RESTORIL) 15 MG capsule Take 1 capsule (15 mg total) by mouth at bedtime as needed for sleep. 02/24/20   Clapacs, Jackquline Denmark, MD    Allergies Haldol [haloperidol]  History reviewed. No pertinent family history.  Social History Social History   Tobacco Use  . Smoking status: Current  Every Day Smoker    Packs/day: 1.00    Years: 40.00    Pack years: 40.00    Types: Cigarettes  . Smokeless tobacco: Never Used  Vaping Use  . Vaping Use: Unknown  Substance Use Topics  . Alcohol use: No  . Drug use: Yes    Types: Cocaine      Review of Systems Unable to get full review of systems due to psychiatric illness ____________________________________________   PHYSICAL EXAM:  VITAL SIGNS: ED Triage Vitals  Enc Vitals Group     BP 03/07/20 1122 125/74     Pulse Rate 03/07/20 1122 96     Resp 03/07/20 1122 18     Temp 03/07/20 1122 97.8 F (36.6 C)     Temp Source 03/07/20 1122 Oral     SpO2 03/07/20 1122 97 %     Weight 03/07/20 1124 133 lb (60.3 kg)     Height 03/07/20 1124 5\' 8"  (1.727 m)     Head Circumference --      Peak Flow --      Pain Score 03/07/20 1123 0     Pain Loc --      Pain Edu? --      Excl. in GC? --     Constitutional: Alert. Well appearing and in no acute distress. Eyes: Conjunctivae are normal. EOMI. Head: Atraumatic. Nose: No congestion/rhinnorhea. Mouth/Throat: Mucous membranes are moist.   Neck: No stridor. Trachea Midline. FROM Cardiovascular: Normal rate, regular rhythm. Grossly normal heart sounds.  Good peripheral circulation. Respiratory: Normal respiratory effort.  No retractions. Lungs CTAB. Gastrointestinal: Soft and nontender. No distention. No abdominal bruits.  Musculoskeletal: No lower extremity tenderness nor edema.  No joint effusions. Neurologic:  No gross focal neurologic deficits are appreciated.  Skin:  Skin is warm, dry and intact. No rash noted. Psychiatric: Pt Denies SI/HI but tangential and not following understanding process of thinking  GU: Deferred   ____________________________________________   LABS (all labs ordered are listed, but only abnormal results are displayed)  Labs Reviewed  COMPREHENSIVE METABOLIC PANEL  ETHANOL  SALICYLATE LEVEL  ACETAMINOPHEN LEVEL  CBC  URINE DRUG SCREEN,  QUALITATIVE ([facility] ONLY)   ____________________________________________    INITIAL IMPRESSION / ASSESSMENT AND PLAN / ED COURSE  Beckett A Tidwell was evaluated in Emergency Department on 03/07/2020 for the symptoms described in the history of present illness. He was evaluated in the context of the global COVID-19 pandemic, which necessitated consideration that the patient might be at risk for infection with the SARS-CoV-2 virus that causes COVID-19. Institutional protocols and algorithms that pertain to the evaluation of patients at risk for COVID-19 are in a state of rapid change based on information released by regulatory bodies including the CDC and federal and state organizations. These policies and algorithms were followed during the patient's  care in the ED.    Patient was IVC due to concerns for decompensated schizophrenia due to not really making a lot of sense and being very tangential in thought.  He denies any SI or HI.  Pt is without any acute medical complaints. No exam findings to suggest medical cause of current presentation. Will order psychiatric screening labs and discuss further w/ psychiatric service.  D/d includes but is not limited to psychiatric disease, behavioral/personality disorder, inadequate socioeconomic support, medical.  Based on HPI, exam, unremarkable labs, no concern for acute medical problem at this time. No rigidity, clonus, hyperthermia, focal neurologic deficit, diaphoresis, tachycardia, meningismus, ataxia, gait abnormality or other finding to suggest this visit represents a non-psychiatric problem. Screening labs reviewed.    Given this, pt medically cleared, to be dispositioned per Psych.  The patient has been placed in psychiatric observation due to the need to provide a safe environment for the patient while obtaining psychiatric consultation and evaluation, as well as ongoing medical and medication management to treat the patient's condition.  The patient  has been placed under full IVC at this time.   Ca low and AG elevated. Will give 1L fluid and 1g Ca.  Will get EKG.  Ekg my review was sinus rate of 84, no st elvation, no twi, normal intervals.   Magnesium was normal.  I have suspicion that this calcium level is not accurate.  Plan on ionized calcium.  In the meantime I'm giving 1 g of calcium.  However if the ionized calcium is normal that I do not think it needs further work-up.  If it still low we can repeat a CMP after giving some of the calcium back.  Patient handed off to oncoming team pending repeat labs   ____________________________________________   FINAL CLINICAL IMPRESSION(S) / ED DIAGNOSES   Final diagnoses:  Schizophrenia, unspecified type (HCC)      MEDICATIONS GIVEN DURING THIS VISIT:  Medications  calcium gluconate 1 g/ 50 mL sodium chloride IVPB (has no administration in time range)  sodium chloride 0.9 % bolus 1,000 mL (1,000 mLs Intravenous New Bag/Given 03/07/20 1420)     ED Discharge Orders    None       Note:  This document was prepared using Dragon voice recognition software and may include unintentional dictation errors.   Concha Se, MD 03/07/20 1247    Concha Se, MD 03/07/20 [phone]

## 2020-03-07 NOTE — ED Notes (Signed)
Pt came out of room saying "Where are my scrubs at?", pt then willingly changed into hospital behavorial scrubs with security officer Will as second for safety.

## 2020-03-07 NOTE — ED Notes (Signed)
Resumed care from Justin Weeks t rn.  Pt resting with eyes closed.  No acute distress. Iv fluids infusing.

## 2020-03-07 NOTE — ED Notes (Signed)
Upon entering his room to assess him he is walking oo the BR  - I told him I will be right back with clothing for him to change into and he stated  "Fuck that - I am not changing my gd clothes - fuck that - I am gone"  He then walked away

## 2020-03-07 NOTE — ED Notes (Signed)
meds infusing  Patient calm and cooperative.

## 2020-03-08 DIAGNOSIS — F250 Schizoaffective disorder, bipolar type: Secondary | ICD-10-CM | POA: Diagnosis not present

## 2020-03-08 LAB — CALCIUM, IONIZED: Calcium, Ionized, Serum: 5.3 mg/dL (ref 4.5–5.6)

## 2020-03-08 LAB — SARS CORONAVIRUS 2 BY RT PCR (HOSPITAL ORDER, PERFORMED IN ~~LOC~~ HOSPITAL LAB): SARS Coronavirus 2: NEGATIVE

## 2020-03-08 MED ORDER — CLONAZEPAM 0.5 MG PO TABS
0.5000 mg | ORAL_TABLET | Freq: Two times a day (BID) | ORAL | Status: DC
  Administered 2020-03-08 (×2): 0.5 mg via ORAL
  Filled 2020-03-08 (×2): qty 1

## 2020-03-08 MED ORDER — TEMAZEPAM 7.5 MG PO CAPS
15.0000 mg | ORAL_CAPSULE | Freq: Every evening | ORAL | Status: DC | PRN
  Filled 2020-03-08: qty 1

## 2020-03-08 MED ORDER — DIVALPROEX SODIUM ER 500 MG PO TB24
750.0000 mg | ORAL_TABLET | Freq: Every day | ORAL | Status: DC
  Administered 2020-03-08: 750 mg via ORAL
  Filled 2020-03-08 (×2): qty 1

## 2020-03-08 MED ORDER — METFORMIN HCL 500 MG PO TABS
1000.0000 mg | ORAL_TABLET | Freq: Two times a day (BID) | ORAL | Status: DC
  Administered 2020-03-08 (×2): 1000 mg via ORAL
  Filled 2020-03-08 (×2): qty 2

## 2020-03-08 MED ORDER — GLIPIZIDE ER 10 MG PO TB24
10.0000 mg | ORAL_TABLET | Freq: Every day | ORAL | Status: DC
  Administered 2020-03-08: 10 mg via ORAL
  Filled 2020-03-08 (×2): qty 1

## 2020-03-08 MED ORDER — LEVOTHYROXINE SODIUM 25 MCG PO TABS
50.0000 ug | ORAL_TABLET | Freq: Every day | ORAL | Status: DC
  Administered 2020-03-08: 50 ug via ORAL
  Filled 2020-03-08: qty 1

## 2020-03-08 MED ORDER — SIMVASTATIN 40 MG PO TABS
20.0000 mg | ORAL_TABLET | Freq: Every day | ORAL | Status: DC
  Administered 2020-03-08: 20 mg via ORAL
  Filled 2020-03-08: qty 1

## 2020-03-08 MED ORDER — PALIPERIDONE ER 3 MG PO TB24: 9.0000 mg | ORAL_TABLET | Freq: Every day | ORAL | Status: DC

## 2020-03-08 MED ORDER — DIVALPROEX SODIUM ER 500 MG PO TB24
500.0000 mg | ORAL_TABLET | Freq: Every morning | ORAL | Status: DC
  Administered 2020-03-08: 500 mg via ORAL
  Filled 2020-03-08: qty 2

## 2020-03-08 MED ORDER — LINAGLIPTIN 5 MG PO TABS
5.0000 mg | ORAL_TABLET | Freq: Every day | ORAL | Status: DC
  Administered 2020-03-08: 5 mg via ORAL
  Filled 2020-03-08 (×2): qty 1

## 2020-03-08 MED ORDER — PALIPERIDONE ER 3 MG PO TB24
9.0000 mg | ORAL_TABLET | Freq: Every day | ORAL | Status: DC
  Administered 2020-03-08: 9 mg via ORAL
  Filled 2020-03-08: qty 3

## 2020-03-08 MED ORDER — DONEPEZIL HCL 5 MG PO TABS
10.0000 mg | ORAL_TABLET | Freq: Every day | ORAL | Status: DC
  Administered 2020-03-08: 10 mg via ORAL
  Filled 2020-03-08 (×2): qty 2

## 2020-03-08 NOTE — Discharge Instructions (Addendum)
Cleared by the psychiatric team for discharge home

## 2020-03-08 NOTE — BH Assessment (Addendum)
Patient has been Psyc cleared earlier today and is recommended for discharge, communicated this to EDP Dr. Fuller Plan as EDP was unaware. Unsure if patient has a place to go. Attempted to contact patient's ACT team with [facility] Team Lead Ms Liborio Nixon at [phone] to confirm patient is living independently, Ms Liborio Nixon did not answer, voicemail was left to return phone call.

## 2020-03-08 NOTE — BH Assessment (Signed)
This Clinical research associate received phone call from patient's ACT team Liborio Nixon [phone] who reports that patient lives independently in his own apartment, will communicate this to EDP so that patient can be discharged.

## 2020-03-08 NOTE — BH Assessment (Signed)
TTS reassessment completed. Pt presented calm, slightly irritable but cooperative and oriented x 3. Pt denied non compliance with medications stating " I take all my medications that come in the bubble pack". Pt reports to be currently renting out a room and confirmed active services with his ACT team. Pt denies any current SI/HI/AH/VH and contracted for safety.   Per Dr. Smith Robert pt will be discharged with the recommendation to follow up with ACT Team

## 2020-03-08 NOTE — ED Provider Notes (Signed)
Emergency Medicine Observation Re-evaluation Note  Justin Weeks is a 59 y.o. male, seen on rounds today.  Pt initially presented to the ED for complaints of Psychiatric Evaluation Currently, the patient is calm but intermittently RTIS. Denies complaints.  Physical Exam  BP 125/74 (BP Location: Right Arm)   Pulse 96   Temp 97.8 F (36.6 C) (Oral)   Resp 18   Ht 5\' 8"  (1.727 m)   Wt 60.3 kg   SpO2 97%   BMI 20.22 kg/m  Physical Exam  Gen: Standing walking around room, wearing scrubs, NAD HEENT: NCAT Resp: Normal WOB in no resp distress GI: Non-distended Ext: Warm, well perfused, no deformity Neuro: Alert, no apparent focal deficits Psych: Calm but occasionally RTIS, ?[facility]  ED Course / MDM  EKG:  Clinical Course as of Mar 08 541  Wed Mar 07, 2020  1945 Repeat basic metabolic panel reveals a normal calcium level   [JW]    Clinical Course User Index [JW] 1946, MD   I have reviewed the labs performed to date as well as medications administered while in observation.  Recent changes in the last 24 hours include: repeat BMP reassuring, awaiting placement. Plan  Current plan is for Psych disposition and admission. . Patient is under full IVC at this time.   Emily Filbert, MD 03/08/20 [phone]

## 2020-03-08 NOTE — Final Progress Note (Signed)
Physician Final Progress Note  Patient ID: Justin Weeks MRN: [id number] DOB/AGE: 61-Jun-1962 61 y.o.  Admit date: 03/07/2020 Admitting provider: No admitting provider for patient encounter. Discharge date: 03/08/2020   Admission Diagnoses:  Schizoaffective disorder   Discharge Diagnoses:  Active Problems:   * [name] *  Same   Consults:    TTS  SW MD Psych  MD ER   Significant Findings/ Diagnostic Studies:  None  Procedures: none   Discharge Condition:  Stable  Disposition:  There are no questions and answers to display.        Diet:    Discharge Activity:  Both as tolerated   IVC rescinded contracts for safety  Mood more normal  NO other new SI HI or plans Judgement insight reliability fair Not clouded or fluctuant Concentration and attention okay  Oriented times four   Says he is taking his meds  Awaits disposition from CW      Total time spent taking care of this patient: 30  minutes  Signed: Roselind Messier 03/08/2020, 4:01 PM

## 2020-03-08 NOTE — ED Notes (Signed)
Hourly rounding reveals patient in room. No complaints, stable, in no acute distress. Q15 minute rounds and monitoring via Rover and Officer to continue.   

## 2020-03-08 NOTE — ED Notes (Signed)
Hourly rounding reveals patient in room. No complaints, stable, in no acute distress. Q15 minute rounds and monitoring via Security Cameras to continue. 

## 2020-03-08 NOTE — ED Notes (Signed)
Report to include Situation, Background, Assessment, and Recommendations received from RN. Patient alert and oriented, warm and dry, in no acute distress. Patient denies SI, HI, AVH and pain. Patient made aware of Q15 minute rounds and security cameras for their safety. Patient instructed to come to me with needs or concerns.  

## 2020-03-08 NOTE — BH Assessment (Addendum)
Writer spoke with [employer] [place] [phone] (Janice-Team Lead) for collateral. Liborio Nixon reported that she doesn't believe pt has been med compliant as pt has been displaying erratic behavior. Liborio Nixon reported that the pt has a chaotic living environment as he recently began smoking cocaine and now lives with a prostitute. Per Liborio Nixon, It is for this reason that [facility] have been following the pt closely.

## 2020-03-08 NOTE — ED Provider Notes (Signed)
Pt d/c earlier by the psychiatric team but no discharge orders have been placed.  I confirm with TTS, they confirm with his act team that he lives at home and that he is good for discharge home   Concha Se, MD 03/08/20 2229

## 2020-03-08 NOTE — ED Notes (Signed)
VOL, pending D/C

## 2020-03-08 NOTE — ED Notes (Signed)
Milk,crackers given to patient °

## 2020-03-08 NOTE — Consult Note (Signed)
.  [facility]

## 2020-03-09 NOTE — ED Notes (Signed)
Pt is clam and cooperative, belong was given to patient. Pt is no acute distress at time discharge. Pt was educated on discharge instructions.

## 2020-07-12 ENCOUNTER — Other Ambulatory Visit: Payer: Self-pay

## 2020-07-12 ENCOUNTER — Emergency Department
Admission: EM | Admit: 2020-07-12 | Discharge: 2020-07-12 | Disposition: A | Discharge status: Home / Self Care | Payer: Medicare Other | Attending: Emergency Medicine | Admitting: Emergency Medicine

## 2020-07-12 DIAGNOSIS — I10 Essential (primary) hypertension: Secondary | ICD-10-CM | POA: Insufficient documentation

## 2020-07-12 DIAGNOSIS — Z7984 Long term (current) use of oral hypoglycemic drugs: Secondary | ICD-10-CM | POA: Diagnosis not present

## 2020-07-12 DIAGNOSIS — Z760 Encounter for issue of repeat prescription: Secondary | ICD-10-CM | POA: Diagnosis not present

## 2020-07-12 DIAGNOSIS — Z79899 Other long term (current) drug therapy: Secondary | ICD-10-CM | POA: Diagnosis not present

## 2020-07-12 DIAGNOSIS — J45909 Unspecified asthma, uncomplicated: Secondary | ICD-10-CM | POA: Diagnosis not present

## 2020-07-12 DIAGNOSIS — E119 Type 2 diabetes mellitus without complications: Secondary | ICD-10-CM | POA: Diagnosis not present

## 2020-07-12 DIAGNOSIS — E039 Hypothyroidism, unspecified: Secondary | ICD-10-CM | POA: Diagnosis not present

## 2020-07-12 DIAGNOSIS — J441 Chronic obstructive pulmonary disease with (acute) exacerbation: Secondary | ICD-10-CM

## 2020-07-12 DIAGNOSIS — F17210 Nicotine dependence, cigarettes, uncomplicated: Secondary | ICD-10-CM | POA: Diagnosis not present

## 2020-07-12 DIAGNOSIS — R0602 Shortness of breath: Secondary | ICD-10-CM | POA: Diagnosis present

## 2020-07-12 MED ORDER — IPRATROPIUM-ALBUTEROL 0.5-2.5 (3) MG/3ML IN SOLN
3.0000 mL | Freq: Once | RESPIRATORY_TRACT | Status: AC
  Administered 2020-07-12: 3 mL via RESPIRATORY_TRACT
  Filled 2020-07-12: qty 3

## 2020-07-12 MED ORDER — ALBUTEROL SULFATE HFA 108 (90 BASE) MCG/ACT IN AERS: 2.0000 | INHALATION_SPRAY | Freq: Four times a day (QID) | RESPIRATORY_TRACT | 1 refills | Status: DC | PRN

## 2020-07-12 NOTE — ED Triage Notes (Signed)
Pt comes into the ED via POV c/o SHOB and a need to refill his inhalers.  Pt states he has been out of his inhalers for a while and he now has wheezing.  Pt unable to answer if he has COPD or asthma.  Pt has even and unlabored respirations, is ambulatory to triage, able to speak in full sentences, but he does have audible wheezing.

## 2020-07-12 NOTE — Discharge Instructions (Signed)
Follow-up with your primary care provider for continued treatment and refills of your medication.  Your prescription was sent to medical [facility].  Continue using it as needed for wheezing or shortness of breath.  Return to the emergency department if any severe worsening of your symptoms.

## 2020-07-12 NOTE — ED Provider Notes (Signed)
[facility] Emergency Department Provider Note  ____________________________________________   First MD Initiated Contact with Patient 07/12/20 1310     (approximate)  I have reviewed the triage vital signs and the nursing notes.   HISTORY  Chief Complaint Shortness of Breath   HPI Justin Weeks is a 59 y.o. male presents to the ED with complaint of shortness of breath and requesting a refill on his inhalers.  Patient states he has been out of his inhalers "a long time" but does not give a specific time.  Patient states that he has had inhalers in the past but does not know if he has been diagnosed with COPD or asthma.  He denies any fever, chills.      Past Medical History:  Diagnosis Date  . Arthritis   . Asthma   . Blood transfusion without reported diagnosis   . Diabetes mellitus without complication (HCC)   . Hypertension   . Hypothyroid 07/03/2015  . Personal history of tobacco use, presenting hazards to health 05/31/2015  . Schizoaffective disorder (HCC)   . Seizures [facility])     Patient Active Problem List   Diagnosis Date Noted  . Schizophrenia (HCC) 02/21/2020  . Schizoaffective disorder (HCC) 10/17/2019  . Suicidal ideation   . Schizoaffective disorder, bipolar type (HCC) 07/04/2015  . HTN (hypertension) 07/04/2015  . Dyslipidemia 07/04/2015  . Tobacco use disorder 07/04/2015  . Noncompliance 07/03/2015  . Hypothyroidism 07/03/2015  . Diabetes (HCC) 04/20/2015    No past surgical history on file.  Prior to Admission medications   Medication Sig Start Date End Date Taking? Authorizing Provider  albuterol (VENTOLIN HFA) 108 (90 Base) MCG/ACT inhaler Inhale 2 puffs into the lungs every 6 (six) hours as needed for wheezing or shortness of breath. 07/12/20   Tommi Rumps, PA-C  clonazePAM (KLONOPIN) 0.5 MG tablet Take 1 tablet (0.5 mg total) by mouth 2 (two) times daily. 02/24/20   Clapacs, Jackquline Denmark, MD  divalproex (DEPAKOTE ER) 250  MG 24 hr tablet Take 3 tablets (750 mg total) by mouth at bedtime. 02/24/20   Clapacs, Jackquline Denmark, MD  divalproex (DEPAKOTE ER) 500 MG 24 hr tablet Take 1 tablet (500 mg total) by mouth every morning. 02/24/20   Clapacs, Jackquline Denmark, MD  donepezil (ARICEPT) 10 MG tablet Take 1 tablet (10 mg total) by mouth daily. 02/24/20   Clapacs, Jackquline Denmark, MD  glipiZIDE (GLUCOTROL XL) 10 MG 24 hr tablet Take 1 tablet (10 mg total) by mouth daily with breakfast. 02/25/20   Clapacs, Jackquline Denmark, MD  levothyroxine (SYNTHROID) 50 MCG tablet Take 1 tablet (50 mcg total) by mouth daily at 6 (six) AM. 02/25/20   Clapacs, Jackquline Denmark, MD  metformin (FORTAMET) 1000 MG (OSM) 24 hr tablet Take 1 tablet (1,000 mg total) by mouth 2 (two) times daily with a meal. 02/24/20   Clapacs, Jackquline Denmark, MD  paliperidone (INVEGA SUSTENNA) 156 MG/ML SUSY injection Inject 1 mL (156 mg total) into the muscle every 28 (twenty-eight) days. 02/27/20   Clapacs, Jackquline Denmark, MD  paliperidone (INVEGA) 9 MG 24 hr tablet Take 1 tablet (9 mg total) by mouth daily. 02/25/20   Clapacs, Jackquline Denmark, MD  simvastatin (ZOCOR) 20 MG tablet Take 1 tablet (20 mg total) by mouth at bedtime. 02/24/20   Clapacs, Jackquline Denmark, MD  sitaGLIPtin (JANUVIA) 100 MG tablet Take 1 tablet (100 mg total) by mouth daily. 02/24/20   Clapacs, Jackquline Denmark, MD  temazepam (RESTORIL) 15 MG  capsule Take 1 capsule (15 mg total) by mouth at bedtime as needed for sleep. 02/24/20   Clapacs, Jackquline Denmark, MD    Allergies Haldol [haloperidol]  No family history on file.  Social History Social History   Tobacco Use  . Smoking status: Current Every Day Smoker    Packs/day: 1.00    Years: 40.00    Pack years: 40.00    Types: Cigarettes  . Smokeless tobacco: Never Used  Vaping Use  . Vaping Use: Unknown  Substance Use Topics  . Alcohol use: No  . Drug use: Yes    Types: Cocaine    Review of Systems Constitutional: No fever/chills Eyes: No visual changes. ENT: No sore throat. Cardiovascular: Denies chest pain. Respiratory:  Denies shortness of breath.  Positive for wheezing. Gastrointestinal: No abdominal pain.  No nausea, no vomiting.  No diarrhea.   Genitourinary: Negative for dysuria. Musculoskeletal: Negative for back pain. Skin: Negative for rash. Neurological: Negative for headaches, focal weakness or numbness. Psychiatric:  Positive for schizoaffective disorder, bipolar type.  Schizophrenia. Endocrine:  Hypothyroidism and diabetes.  ____________________________________________   PHYSICAL EXAM:  VITAL SIGNS: ED Triage Vitals [07/12/20 1304]  Enc Vitals Group     BP 119/86     Pulse Rate 88     Resp 20     Temp 98 F (36.7 C)     Temp Source Oral     SpO2 98 %     Weight 147 lb (66.7 kg)     Height 5\' 8"  (1.727 m)     Head Circumference      Peak Flow      Pain Score 0     Pain Loc      Pain Edu?      Excl. in GC?     Constitutional: Alert and oriented. Well appearing and in no acute distress. Eyes: Conjunctivae are normal.  Head: Atraumatic. Nose: No congestion/rhinnorhea. Mouth/Throat: Mucous membranes are moist.  Oropharynx non-erythematous. Neck: No stridor.   Cardiovascular: Normal rate, regular rhythm. Grossly normal heart sounds.  Good peripheral circulation. Respiratory: Normal respiratory effort.  No retractions. Lungs bilateral expiratory wheezes are noted.  Patient is able to talk in complete sentences without any difficulty and is constantly walking up and down the hallway stating that he is going to leave.  No increased shortness of breath on exertion. Gastrointestinal: Soft and nontender. No distention.  Neurologic:  Normal speech and language. No gross focal neurologic deficits are appreciated. No gait instability. Skin:  Skin is warm, dry and intact. No rash noted. Psychiatric: Mood and affect are normal. Speech and behavior are normal.  ____________________________________________   LABS (all labs ordered are listed, but only abnormal results are displayed)  Labs  Reviewed - No data to display ____________________________________________   RADIOLOGY  Deferred.  Official radiology report(s): No results found.  ____________________________________________   PROCEDURES  Procedure(s) performed (including Critical Care):  Procedures   ____________________________________________   INITIAL IMPRESSION / ASSESSMENT AND PLAN / ED COURSE  As part of my medical decision making, I reviewed the following data within the electronic MEDICAL RECORD NUMBER Notes from prior ED visits and [place] Controlled Substance Database  59 year old male presents to the ED for refill of his inhalers.  Patient was extremely difficult to get into an exam room to listen to his lungs.  He Walking up and down the hall briskly shouting that he was going to be leaving in 1 minute if no one gave him his pills.  On physical exam patient did have wheezing which cleared greatly with 1 DuoNeb treatment.  Patient was afebrile and had an O2 sat of 98%.  There was no observed shortness of breath or difficulty breathing while talking with him.  Prescription for albuterol inhaler was sent to his pharmacy which he says will be delivered tomorrow.  He is encouraged to follow-up with his primary care for continued treatment and for reevaluation. ____________________________________________   FINAL CLINICAL IMPRESSION(S) / ED DIAGNOSES  Final diagnoses:  COPD exacerbation (HCC)  Encounter for medication refill     ED Discharge Orders         Ordered    albuterol (VENTOLIN HFA) 108 (90 Base) MCG/ACT inhaler  Every 6 hours PRN        07/12/20 1355          *Please note:  Justin Weeks was evaluated in Emergency Department on 07/12/2020 for the symptoms described in the history of present illness. He was evaluated in the context of the global COVID-19 pandemic, which necessitated consideration that the patient might be at risk for infection with the SARS-CoV-2 virus that causes COVID-19.  Institutional protocols and algorithms that pertain to the evaluation of patients at risk for COVID-19 are in a state of rapid change based on information released by regulatory bodies including the CDC and federal and state organizations. These policies and algorithms were followed during the patient's care in the ED.  Some ED evaluations and interventions may be delayed as a result of limited staffing during and the pandemic.*   Note:  This document was prepared using Dragon voice recognition software and may include unintentional dictation errors.    Tommi Rumps, PA-C 07/12/20 1451    Arnaldo Natal, MD 07/12/20 [phone]

## 2021-05-31 ENCOUNTER — Inpatient Hospital Stay
Admission: RE | Admit: 2021-05-31 | Discharge: 2021-06-06 | DRG: 885 | Disposition: A | Discharge status: Home / Self Care | Payer: Medicare Other | Source: Intra-hospital | Attending: Behavioral Health | Admitting: Behavioral Health

## 2021-05-31 ENCOUNTER — Encounter: Payer: Self-pay | Admitting: Psychiatry

## 2021-05-31 ENCOUNTER — Emergency Department (EMERGENCY_DEPARTMENT_HOSPITAL)
Admission: EM | Admit: 2021-05-31 | Discharge: 2021-05-31 | Disposition: A | Discharge status: Other Acute Inpatient Hospital | Payer: Medicare Other | Source: Home / Self Care | Attending: Emergency Medicine | Admitting: Emergency Medicine

## 2021-05-31 ENCOUNTER — Encounter: Payer: Self-pay | Admitting: Emergency Medicine

## 2021-05-31 ENCOUNTER — Other Ambulatory Visit: Payer: Self-pay

## 2021-05-31 DIAGNOSIS — Z7984 Long term (current) use of oral hypoglycemic drugs: Secondary | ICD-10-CM | POA: Insufficient documentation

## 2021-05-31 DIAGNOSIS — F17200 Nicotine dependence, unspecified, uncomplicated: Secondary | ICD-10-CM | POA: Diagnosis present

## 2021-05-31 DIAGNOSIS — N182 Chronic kidney disease, stage 2 (mild): Secondary | ICD-10-CM | POA: Diagnosis present

## 2021-05-31 DIAGNOSIS — E119 Type 2 diabetes mellitus without complications: Secondary | ICD-10-CM | POA: Insufficient documentation

## 2021-05-31 DIAGNOSIS — F259 Schizoaffective disorder, unspecified: Secondary | ICD-10-CM | POA: Diagnosis present

## 2021-05-31 DIAGNOSIS — F29 Unspecified psychosis not due to a substance or known physiological condition: Secondary | ICD-10-CM | POA: Diagnosis not present

## 2021-05-31 DIAGNOSIS — E039 Hypothyroidism, unspecified: Secondary | ICD-10-CM | POA: Insufficient documentation

## 2021-05-31 DIAGNOSIS — Z20822 Contact with and (suspected) exposure to covid-19: Secondary | ICD-10-CM | POA: Diagnosis present

## 2021-05-31 DIAGNOSIS — Y900 Blood alcohol level of less than 20 mg/100 ml: Secondary | ICD-10-CM | POA: Insufficient documentation

## 2021-05-31 DIAGNOSIS — F17210 Nicotine dependence, cigarettes, uncomplicated: Secondary | ICD-10-CM | POA: Diagnosis present

## 2021-05-31 DIAGNOSIS — Z91199 Patient's noncompliance with other medical treatment and regimen due to unspecified reason: Secondary | ICD-10-CM | POA: Diagnosis not present

## 2021-05-31 DIAGNOSIS — I10 Essential (primary) hypertension: Secondary | ICD-10-CM | POA: Diagnosis present

## 2021-05-31 DIAGNOSIS — F250 Schizoaffective disorder, bipolar type: Secondary | ICD-10-CM

## 2021-05-31 DIAGNOSIS — J45909 Unspecified asthma, uncomplicated: Secondary | ICD-10-CM | POA: Diagnosis present

## 2021-05-31 DIAGNOSIS — I129 Hypertensive chronic kidney disease with stage 1 through stage 4 chronic kidney disease, or unspecified chronic kidney disease: Secondary | ICD-10-CM | POA: Diagnosis present

## 2021-05-31 DIAGNOSIS — Z79899 Other long term (current) drug therapy: Secondary | ICD-10-CM | POA: Insufficient documentation

## 2021-05-31 DIAGNOSIS — R413 Other amnesia: Secondary | ICD-10-CM | POA: Diagnosis present

## 2021-05-31 DIAGNOSIS — E785 Hyperlipidemia, unspecified: Secondary | ICD-10-CM | POA: Diagnosis present

## 2021-05-31 DIAGNOSIS — E1122 Type 2 diabetes mellitus with diabetic chronic kidney disease: Secondary | ICD-10-CM | POA: Diagnosis present

## 2021-05-31 DIAGNOSIS — R45851 Suicidal ideations: Secondary | ICD-10-CM

## 2021-05-31 LAB — CBC
HCT: 40.1 % (ref 39.0–52.0)
Hemoglobin: 13.8 g/dL (ref 13.0–17.0)
MCH: 28.8 pg (ref 26.0–34.0)
MCHC: 34.4 g/dL (ref 30.0–36.0)
MCV: 83.5 fL (ref 80.0–100.0)
Platelets: 232 10*3/uL (ref 150–400)
RBC: 4.8 MIL/uL (ref 4.22–5.81)
RDW: 13.9 % (ref 11.5–15.5)
WBC: 4.8 10*3/uL (ref 4.0–10.5)
nRBC: 0 % (ref 0.0–0.2)

## 2021-05-31 LAB — COMPREHENSIVE METABOLIC PANEL
ALT: 15 U/L (ref 0–44)
AST: 22 U/L (ref 15–41)
Albumin: 3.9 g/dL (ref 3.5–5.0)
Alkaline Phosphatase: 73 U/L (ref 38–126)
Anion gap: 9 (ref 5–15)
BUN: 14 mg/dL (ref 8–23)
CO2: 29 mmol/L (ref 22–32)
Calcium: 9 mg/dL (ref 8.9–10.3)
Chloride: 100 mmol/L (ref 98–111)
Creatinine, Ser: 1.24 mg/dL (ref 0.61–1.24)
GFR, Estimated: >60 mL/min (ref >60)
Glucose, Bld: 144 mg/dL — ABNORMAL HIGH (ref 70–99)
Potassium: 3.8 mmol/L (ref 3.5–5.1)
Sodium: 138 mmol/L (ref 135–145)
Total Bilirubin: 0.5 mg/dL (ref 0.3–1.2)
Total Protein: 7.9 g/dL (ref 6.5–8.1)

## 2021-05-31 LAB — RESP PANEL BY RT-PCR (FLU A&B, COVID) ARPGX2
Influenza A by PCR: NEGATIVE
Influenza B by PCR: NEGATIVE
SARS Coronavirus 2 by RT PCR: NEGATIVE

## 2021-05-31 LAB — GLUCOSE, CAPILLARY: Glucose-Capillary: 251 mg/dL — ABNORMAL HIGH (ref 70–99)

## 2021-05-31 LAB — LIPID PANEL
Cholesterol: 120 mg/dL (ref 0–200)
Cholesterol: 128 mg/dL (ref 0–200)
HDL: 30 mg/dL — ABNORMAL LOW (ref >40)
HDL: 34 mg/dL — ABNORMAL LOW (ref >40)
LDL Cholesterol: 71 mg/dL (ref 0–99)
LDL Cholesterol: 80 mg/dL (ref 0–99)
Total CHOL/HDL Ratio: 4 RATIO
Total CHOL/HDL Ratio: 3.8 RATIO
Triglycerides: 94 mg/dL (ref <150)
Triglycerides: 72 mg/dL (ref <150)
VLDL: 19 mg/dL (ref 0–40)
VLDL: 14 mg/dL (ref 0–40)

## 2021-05-31 LAB — ACETAMINOPHEN LEVEL: Acetaminophen (Tylenol), Serum: <10 ug/mL — ABNORMAL LOW (ref 10–30)

## 2021-05-31 LAB — SALICYLATE LEVEL: Salicylate Lvl: <7 mg/dL — ABNORMAL LOW (ref 7.0–30.0)

## 2021-05-31 LAB — ETHANOL: Alcohol, Ethyl (B): <10 mg/dL (ref <10)

## 2021-05-31 MED ORDER — LEVOTHYROXINE SODIUM 50 MCG PO TABS
50.0000 ug | ORAL_TABLET | Freq: Every day | ORAL | Status: DC
  Administered 2021-06-01 – 2021-06-06 (×6): 50 ug via ORAL
  Filled 2021-05-31 (×6): qty 1

## 2021-05-31 MED ORDER — BUPROPION HCL 100 MG PO TABS
100.0000 mg | ORAL_TABLET | Freq: Every morning | ORAL | Status: DC
  Administered 2021-05-31: 100 mg via ORAL
  Filled 2021-05-31: qty 1

## 2021-05-31 MED ORDER — SIMVASTATIN 40 MG PO TABS
20.0000 mg | ORAL_TABLET | Freq: Every day | ORAL | Status: DC
  Administered 2021-05-31 – 2021-06-05 (×6): 20 mg via ORAL
  Filled 2021-05-31 (×6): qty 1

## 2021-05-31 MED ORDER — DIVALPROEX SODIUM ER 500 MG PO TB24
750.0000 mg | ORAL_TABLET | Freq: Every day | ORAL | Status: DC
  Administered 2021-05-31 – 2021-06-05 (×6): 750 mg via ORAL
  Filled 2021-05-31 (×7): qty 1

## 2021-05-31 MED ORDER — METFORMIN HCL ER 500 MG PO TB24
1000.0000 mg | ORAL_TABLET | Freq: Two times a day (BID) | ORAL | Status: DC
  Administered 2021-05-31 – 2021-06-06 (×12): 1000 mg via ORAL
  Filled 2021-05-31 (×13): qty 2

## 2021-05-31 MED ORDER — ALBUTEROL SULFATE (2.5 MG/3ML) 0.083% IN NEBU
3.0000 mL | INHALATION_SOLUTION | Freq: Four times a day (QID) | RESPIRATORY_TRACT | Status: DC | PRN
  Filled 2021-05-31: qty 3

## 2021-05-31 MED ORDER — LINAGLIPTIN 5 MG PO TABS
5.0000 mg | ORAL_TABLET | Freq: Every day | ORAL | Status: DC
  Administered 2021-05-31 – 2021-06-06 (×7): 5 mg via ORAL
  Filled 2021-05-31 (×9): qty 1

## 2021-05-31 MED ORDER — DONEPEZIL HCL 5 MG PO TABS
10.0000 mg | ORAL_TABLET | Freq: Every day | ORAL | Status: DC
  Administered 2021-05-31: 10 mg via ORAL
  Filled 2021-05-31: qty 2

## 2021-05-31 MED ORDER — GLIPIZIDE ER 5 MG PO TB24: 5.0000 mg | ORAL_TABLET | Freq: Every day | ORAL | Status: DC

## 2021-05-31 MED ORDER — LEVOTHYROXINE SODIUM 50 MCG PO TABS
50.0000 ug | ORAL_TABLET | Freq: Every day | ORAL | Status: DC
  Administered 2021-05-31: 50 ug via ORAL
  Filled 2021-05-31: qty 1

## 2021-05-31 MED ORDER — DIVALPROEX SODIUM ER 500 MG PO TB24
500.0000 mg | ORAL_TABLET | Freq: Every morning | ORAL | Status: DC
  Administered 2021-06-01 – 2021-06-06 (×6): 500 mg via ORAL
  Filled 2021-05-31 (×6): qty 1

## 2021-05-31 MED ORDER — HYDROXYZINE HCL 50 MG PO TABS
50.0000 mg | ORAL_TABLET | Freq: Three times a day (TID) | ORAL | Status: DC | PRN
  Administered 2021-06-01 – 2021-06-02 (×2): 50 mg via ORAL
  Filled 2021-05-31 (×2): qty 1

## 2021-05-31 MED ORDER — SIMVASTATIN 10 MG PO TABS: 20.0000 mg | ORAL_TABLET | Freq: Every day | ORAL | Status: DC

## 2021-05-31 MED ORDER — ACETAMINOPHEN 325 MG PO TABS: 650.0000 mg | ORAL_TABLET | Freq: Four times a day (QID) | ORAL | Status: DC | PRN

## 2021-05-31 MED ORDER — GLIPIZIDE ER 5 MG PO TB24
5.0000 mg | ORAL_TABLET | Freq: Every day | ORAL | Status: DC
Start: 2021-06-01 — End: 2021-06-06
  Administered 2021-06-01 – 2021-06-06 (×6): 5 mg via ORAL
  Filled 2021-05-31 (×6): qty 1

## 2021-05-31 MED ORDER — MAGNESIUM HYDROXIDE 400 MG/5ML PO SUSP: 30.0000 mL | Freq: Every day | ORAL | Status: DC | PRN

## 2021-05-31 MED ORDER — METFORMIN HCL ER 500 MG PO TB24
1000.0000 mg | ORAL_TABLET | Freq: Two times a day (BID) | ORAL | Status: DC
  Filled 2021-05-31: qty 2

## 2021-05-31 MED ORDER — BUPROPION HCL 100 MG PO TABS
100.0000 mg | ORAL_TABLET | Freq: Every morning | ORAL | Status: DC
  Administered 2021-06-01 – 2021-06-06 (×6): 100 mg via ORAL
  Filled 2021-05-31 (×6): qty 1

## 2021-05-31 MED ORDER — ALBUTEROL SULFATE (2.5 MG/3ML) 0.083% IN NEBU: 3.0000 mL | INHALATION_SOLUTION | Freq: Four times a day (QID) | RESPIRATORY_TRACT | Status: DC | PRN

## 2021-05-31 MED ORDER — DONEPEZIL HCL 5 MG PO TABS
10.0000 mg | ORAL_TABLET | Freq: Every day | ORAL | Status: DC
  Administered 2021-06-01 – 2021-06-06 (×6): 10 mg via ORAL
  Filled 2021-05-31 (×6): qty 2

## 2021-05-31 MED ORDER — ALUM & MAG HYDROXIDE-SIMETH 200-200-20 MG/5ML PO SUSP: 30.0000 mL | ORAL | Status: DC | PRN

## 2021-05-31 MED ORDER — DIVALPROEX SODIUM ER 250 MG PO TB24
500.0000 mg | ORAL_TABLET | Freq: Every morning | ORAL | Status: DC
  Administered 2021-05-31: 500 mg via ORAL
  Filled 2021-05-31: qty 2

## 2021-05-31 MED ORDER — DIVALPROEX SODIUM ER 250 MG PO TB24: 750.0000 mg | ORAL_TABLET | Freq: Every day | ORAL | Status: DC

## 2021-05-31 NOTE — Progress Notes (Signed)
Patient calm during assessment denying SI/HI/AVH. Patient observed interacting appropriately with staff and peers on the unit. Patient compliant with medication administration per MD orders. Pt given education, support, and encouragement to be active in his treatment plan. Pt being monitored Q 15 minutes for safety per unit protocol. Pt remains safe on the unit.   

## 2021-05-31 NOTE — ED Notes (Addendum)
Pt dressed out by this RN, [employer], EDT and [employer], Security.

## 2021-05-31 NOTE — ED Notes (Signed)
Pt escorted to room 20 by staff and security. Pt calm, cooperative. Pt states "I have racing thoughts; running here and there." Pt denies pain and SI/HI at this time. Pt denies VH, stating "my mind playing tricks on me but I'm not seeing things"; however he endorses AH, stating voices say "run off, go there, go here. It keeps saying, then stop and keep trying to get my money." Pt states that he has not been eating or sleeping well because "the place I was staying at haunted"; he says that he does not know when the last time that he has slept. Pt states "I haven't been eating well because I ain't got no money." Pt denies anxiety and depression at this time stating "I feel comfortable; I feel safe here, like people going to help me." When asked the reason for his visit to the hospital, pt states "the place is haunted; the apartment complex. A whole lot of people died there and they're haunting me and I don't want to go back there again. Pt states "my goal is to get back on [address] [place] before I leave here." No acute distress noted.

## 2021-05-31 NOTE — ED Triage Notes (Signed)
Pt to ED, pt states came by [employer] however no [employer] officers came to desk with patient. Pt repeatedly stating that he needs to go to the psychiatric ward, "I been up there before, I need to go to the psychiatric ward". Pt becomes agitated when asked triage questions at this time. Pt states "I need help". Pt able to give this RN the [employer] phone number.   Pt denies SI/HI. Pt states he is not going back to the "haunted place", pt states "if y'all turn me back then I'll be back to the streets". Pt states has not been taking his medication, states he has been forgetting to take his medication at home.

## 2021-05-31 NOTE — ED Provider Notes (Signed)
[facility] Emergency Department Provider Note  ____________________________________________   Event Date/Time   First MD Initiated Contact with Patient 05/31/21 [phone]     (approximate)  I have reviewed the triage vital signs and the nursing notes.   HISTORY  Chief Complaint No chief complaint on file.    HPI Justin Weeks is a 60 y.o. male with history of hypertension, hypothyroidism, diabetes, schizoaffective disorder who presents to the emergency department requesting a psychiatric evaluation.  Patient tells me that he is hearing voices.  States he they are telling him "to do things I might regret".  He denies SI, HI.  States he is not taking his medications.  Denies drug or alcohol use.        Past Medical History:  Diagnosis Date   Arthritis    Asthma    Blood transfusion without reported diagnosis    Diabetes mellitus without complication (HCC)    Hypertension    Hypothyroid 07/03/2015   Personal history of tobacco use, presenting hazards to health 05/31/2015   Schizoaffective disorder (HCC)    Seizures (HCC)     Patient Active Problem List   Diagnosis Date Noted   Schizophrenia (HCC) 02/21/2020   Schizoaffective disorder (HCC) 10/17/2019   Suicidal ideation    Schizoaffective disorder, bipolar type (HCC) 07/04/2015   HTN (hypertension) 07/04/2015   Dyslipidemia 07/04/2015   Tobacco use disorder 07/04/2015   Noncompliance 07/03/2015   Hypothyroidism 07/03/2015   Diabetes (HCC) 04/20/2015    History reviewed. No pertinent surgical history.  Prior to Admission medications   Medication Sig Start Date End Date Taking? Authorizing Provider  albuterol (VENTOLIN HFA) 108 (90 Base) MCG/ACT inhaler Inhale 2 puffs into the lungs every 6 (six) hours as needed for wheezing or shortness of breath. 07/12/20   Tommi Rumps, PA-C  clonazePAM (KLONOPIN) 0.5 MG tablet Take 1 tablet (0.5 mg total) by mouth 2 (two) times daily. 02/24/20    Clapacs, Jackquline Denmark, MD  divalproex (DEPAKOTE ER) 250 MG 24 hr tablet Take 3 tablets (750 mg total) by mouth at bedtime. 02/24/20   Clapacs, Jackquline Denmark, MD  divalproex (DEPAKOTE ER) 500 MG 24 hr tablet Take 1 tablet (500 mg total) by mouth every morning. 02/24/20   Clapacs, Jackquline Denmark, MD  donepezil (ARICEPT) 10 MG tablet Take 1 tablet (10 mg total) by mouth daily. 02/24/20   Clapacs, Jackquline Denmark, MD  glipiZIDE (GLUCOTROL XL) 10 MG 24 hr tablet Take 1 tablet (10 mg total) by mouth daily with breakfast. 02/25/20   Clapacs, Jackquline Denmark, MD  levothyroxine (SYNTHROID) 50 MCG tablet Take 1 tablet (50 mcg total) by mouth daily at 6 (six) AM. 02/25/20   Clapacs, Jackquline Denmark, MD  metformin (FORTAMET) 1000 MG (OSM) 24 hr tablet Take 1 tablet (1,000 mg total) by mouth 2 (two) times daily with a meal. 02/24/20   Clapacs, Jackquline Denmark, MD  paliperidone (INVEGA SUSTENNA) 156 MG/ML SUSY injection Inject 1 mL (156 mg total) into the muscle every 28 (twenty-eight) days. 02/27/20   Clapacs, Jackquline Denmark, MD  paliperidone (INVEGA) 9 MG 24 hr tablet Take 1 tablet (9 mg total) by mouth daily. 02/25/20   Clapacs, Jackquline Denmark, MD  simvastatin (ZOCOR) 20 MG tablet Take 1 tablet (20 mg total) by mouth at bedtime. 02/24/20   Clapacs, Jackquline Denmark, MD  sitaGLIPtin (JANUVIA) 100 MG tablet Take 1 tablet (100 mg total) by mouth daily. 02/24/20   Clapacs, Jackquline Denmark, MD  temazepam (RESTORIL) 15  MG capsule Take 1 capsule (15 mg total) by mouth at bedtime as needed for sleep. 02/24/20   Clapacs, Jackquline Denmark, MD    Allergies Haldol [haloperidol]  History reviewed. No pertinent family history.  Social History Social History   Tobacco Use   Smoking status: Every Day    Packs/day: 1.00    Years: 40.00    Pack years: 40.00    Types: Cigarettes   Smokeless tobacco: Never  Vaping Use   Vaping Use: Unknown  Substance Use Topics   Alcohol use: No   Drug use: Yes    Types: Cocaine    Review of Systems Constitutional: No fever. Eyes: No visual changes. ENT: No sore  throat. Cardiovascular: Denies chest pain. Respiratory: Denies shortness of breath. Gastrointestinal: No nausea, vomiting, diarrhea. Genitourinary: Negative for dysuria. Musculoskeletal: Negative for back pain. Skin: Negative for rash. Neurological: Negative for focal weakness or numbness.  ____________________________________________   PHYSICAL EXAM:  VITAL SIGNS: ED Triage Vitals  Enc Vitals Group     BP 05/31/21 0354 136/84     Pulse Rate 05/31/21 0352 100     Resp 05/31/21 0352 19     Temp 05/31/21 0352 98.6 F (37 C)     Temp Source 05/31/21 0352 Oral     SpO2 05/31/21 0352 95 %     Weight 05/31/21 0352 147 lb 0.8 oz (66.7 kg)     Height 05/31/21 0352 5\' 8"  (1.727 m)     Head Circumference --      Peak Flow --      Pain Score 05/31/21 0352 0     Pain Loc --      Pain Edu? --      Excl. in GC? --    CONSTITUTIONAL: Alert and oriented and responds appropriately to questions.  Chronically ill-appearing HEAD: Normocephalic EYES: Conjunctivae clear, pupils appear equal, EOM appear intact ENT: normal nose; moist mucous membranes NECK: Supple, normal ROM CARD: RRR; S1 and S2 appreciated; no murmurs, no clicks, no rubs, no gallops RESP: Normal chest excursion without splinting or tachypnea; breath sounds clear and equal bilaterally; no wheezes, no rhonchi, no rales, no hypoxia or respiratory distress, speaking full sentences ABD/GI: Normal bowel sounds; non-distended; soft, non-tender, no rebound, no guarding, no peritoneal signs, no hepatosplenomegaly BACK: The back appears normal EXT: Normal ROM in all joints; no deformity noted, no edema; no cyanosis SKIN: Normal color for age and race; warm; no rash on exposed skin NEURO: Moves all extremities equally PSYCH: No SI, HI.  Does not appear to be responding to internal stimuli.  ____________________________________________   LABS (all labs ordered are listed, but only abnormal results are displayed)  Labs Reviewed   COMPREHENSIVE METABOLIC PANEL - Abnormal; Notable for the following components:      Result Value   Glucose, Bld 144 (*)    All other components within normal limits  RESP PANEL BY RT-PCR (FLU A&B, COVID) ARPGX2  CBC  ETHANOL  SALICYLATE LEVEL  ACETAMINOPHEN LEVEL  URINE DRUG SCREEN, QUALITATIVE ([facility] ONLY)   ____________________________________________  EKG   ____________________________________________  RADIOLOGY I, Ariabella Brien, personally viewed and evaluated these images (plain radiographs) as part of my medical decision making, as well as reviewing the written report by the radiologist.  ED MD interpretation:    Official radiology report(s): No results found.  ____________________________________________   PROCEDURES  Procedure(s) performed (including Critical Care):  Procedures    ____________________________________________   INITIAL IMPRESSION / ASSESSMENT AND PLAN / ED  COURSE  As part of my medical decision making, I reviewed the following data within the electronic MEDICAL RECORD NUMBER Nursing notes reviewed and incorporated, Labs reviewed , Old chart reviewed, A consult was requested and obtained from this/these consultant(s) Psychiatry, and Notes from prior ED visits         Patient here with hallucinations.  He is very vague about what he is hearing but tells the psychiatry team that they are telling him to "do things I might regret".  Denies SI, HI.  Does not appear to be responding to internal stimuli but has a well-documented history of psychosis.  Screening labs unremarkable.  ED PROGRESS  4:48 AM  Pt seen by Janee Morn NP with psychiatry and TTS.  They recommend inpatient psychiatric treatment.  They have requested that I place patient under involuntary commitment.  I reviewed all nursing notes and pertinent previous records as available.  I have reviewed and interpreted any EKGs, lab and urine results, imaging (as  available).  ____________________________________________   FINAL CLINICAL IMPRESSION(S) / ED DIAGNOSES  Final diagnoses:  Psychosis, unspecified psychosis type [facility])     ED Discharge Orders     None       *Please note:  Justin Weeks was evaluated in Emergency Department on 05/31/2021 for the symptoms described in the history of present illness. He was evaluated in the context of the global COVID-19 pandemic, which necessitated consideration that the patient might be at risk for infection with the SARS-CoV-2 virus that causes COVID-19. Institutional protocols and algorithms that pertain to the evaluation of patients at risk for COVID-19 are in a state of rapid change based on information released by regulatory bodies including the CDC and federal and state organizations. These policies and algorithms were followed during the patient's care in the ED.  Some ED evaluations and interventions may be delayed as a result of limited staffing during and the pandemic.*   Note:  This document was prepared using Dragon voice recognition software and may include unintentional dictation errors.    Perl Folmar, Layla Maw, DO 05/31/21 [phone]

## 2021-05-31 NOTE — Consult Note (Signed)
[facility] Face-to-Face Psychiatry Consult   Reason for Consult: Consult for 60 year old man with history of schizoaffective disorder who came to the emergency room paranoid psychotic demanding admission. Referring Physician: Quale Patient Identification: Justin Weeks MRN:  [id number] Principal Diagnosis: Schizoaffective disorder, bipolar type (HCC) Diagnosis:  Principal Problem:   Schizoaffective disorder, bipolar type (HCC) Active Problems:   Diabetes (HCC)   Noncompliance   HTN (hypertension)   Tobacco use disorder   Suicidal ideation   Schizoaffective disorder (HCC)   Psychosis (HCC)   Total Time spent with patient: 1 hour  Subjective:   Justin Weeks is a 60 y.o. male patient admitted with "I am not going back to that haunted place".  HPI: Patient seen chart reviewed.  60 year old man with chronic mental health problems.  Patient came to the emergency room voluntarily it sounds like he probably called 911 himself.  He says that his apartment is haunted.  There are ghosts in the bedroom and throughout the apartment.  He also hears voices constantly telling him things and making him nervous.  He has been more jittery and agitated and irritable.  Patient was a little ambivalent about whether he had been compliant with all of his medicines.  He does have a [employer] ACT team following up with him.  Patient was not able to describe any other particular stressor going on with him.  He does say that he has been eating and sleeping poorly at home.  Denies suicidal or homicidal ideation.  Denies any alcohol or drug abuse.  Past Psychiatric History: Chronic mental health problems with several prior hospitalizations.  Has ACT team follow-up.  No known history of suicidal behavior.  When psychotic often will get grumpy and raises voice but does not have a history of significant violence.  Does not abuse substances typically  Risk to Self:   Risk to Others:   Prior Inpatient Therapy:   Prior  Outpatient Therapy:    Past Medical History:  Past Medical History:  Diagnosis Date   Arthritis    Asthma    Blood transfusion without reported diagnosis    Diabetes mellitus without complication (HCC)    Hypertension    Hypothyroid 07/03/2015   Personal history of tobacco use, presenting hazards to health 05/31/2015   Schizoaffective disorder (HCC)    Seizures (HCC)    History reviewed. No pertinent surgical history. Family History: History reviewed. No pertinent family history. Family Psychiatric  History: None reported Social History:  Social History   Substance and Sexual Activity  Alcohol Use No     Social History   Substance and Sexual Activity  Drug Use Yes   Types: Cocaine    Social History   Socioeconomic History   Marital status: Divorced    Spouse name: Not on file   Number of children: Not on file   Years of education: Not on file   Highest education level: Not on file  Occupational History   Not on file  Tobacco Use   Smoking status: Every Day    Packs/day: 1.00    Years: 40.00    Pack years: 40.00    Types: Cigarettes   Smokeless tobacco: Never  Vaping Use   Vaping Use: Unknown  Substance and Sexual Activity   Alcohol use: No   Drug use: Yes    Types: Cocaine   Sexual activity: Yes  Other Topics Concern   Not on file  Social History Narrative   Not on file  Social Determinants of Health   Financial Resource Strain: Not on file  Food Insecurity: Not on file  Transportation Needs: Not on file  Physical Activity: Not on file  Stress: Not on file  Social Connections: Not on file   Additional Social History:    Allergies:   Allergies  Allergen Reactions   Haldol [Haloperidol] Other (See Comments)    Reaction:  Agitation  Pt states that he also experiences lockjaw from this medication.      Labs:  Results for orders placed or performed during the hospital encounter of 05/31/21 (from the past 48 hour(s))  Comprehensive metabolic  panel     Status: Abnormal   Collection Time: 05/31/21  3:57 AM  Result Value Ref Range   Sodium 138 135 - 145 mmol/L   Potassium 3.8 3.5 - 5.1 mmol/L   Chloride 100 98 - 111 mmol/L   CO2 29 22 - 32 mmol/L   Glucose, Bld 144 (H) 70 - 99 mg/dL    Comment: Glucose reference range applies only to samples taken after fasting for at least 8 hours.   BUN 14 8 - 23 mg/dL   Creatinine, Ser 6.21 0.61 - 1.24 mg/dL   Calcium 9.0 8.9 - 30.8 mg/dL   Total Protein 7.9 6.5 - 8.1 g/dL   Albumin 3.9 3.5 - 5.0 g/dL   AST 22 15 - 41 U/L   ALT 15 0 - 44 U/L   Alkaline Phosphatase 73 38 - 126 U/L   Total Bilirubin 0.5 0.3 - 1.2 mg/dL   GFR, Estimated >65 >78 mL/min    Comment: (NOTE) Calculated using the CKD-EPI Creatinine Equation (2021)    Anion gap 9 5 - 15    Comment: Performed at [facility], [address] [place], [place] [address]  Ethanol     Status: None   Collection Time: 05/31/21  3:57 AM  Result Value Ref Range   Alcohol, Ethyl (B) <10 <10 mg/dL    Comment: (NOTE) Lowest detectable limit for serum alcohol is 10 mg/dL.  For medical purposes only. Performed at [facility], [address] [place], [place] [address]   Salicylate level     Status: Abnormal   Collection Time: 05/31/21  3:57 AM  Result Value Ref Range   Salicylate Lvl <7.0 (L) 7.0 - 30.0 mg/dL    Comment: Performed at [facility], [address] [place], [place] [address]  Acetaminophen level     Status: Abnormal   Collection Time: 05/31/21  3:57 AM  Result Value Ref Range   Acetaminophen (Tylenol), Serum <10 (L) 10 - 30 ug/mL    Comment: (NOTE) Therapeutic concentrations vary significantly. A range of 10-30 ug/mL  may be an effective concentration for many patients. However, some  are best treated at concentrations outside of this range. Acetaminophen concentrations >150 ug/mL at 4 hours after ingestion  and >50 ug/mL at 12 hours after ingestion are often associated with  toxic  reactions.  Performed at [facility], [address] [place], [place] [address]   cbc     Status: None   Collection Time: 05/31/21  3:57 AM  Result Value Ref Range   WBC 4.8 4.0 - 10.5 K/uL   RBC 4.80 4.22 - 5.81 MIL/uL   Hemoglobin 13.8 13.0 - 17.0 g/dL   HCT 25.3 66.4 - 40.3 %   MCV 83.5 80.0 - 100.0 fL   MCH 28.8 26.0 - 34.0 pg   MCHC 34.4 30.0 -  36.0 g/dL   RDW 57.4 73.4 - 03.7 %   Platelets 232 150 - 400 K/uL   nRBC 0.0 0.0 - 0.2 %    Comment: Performed at [facility], [address] [place], [place] [address]  Resp Panel by RT-PCR (Flu A&B, Covid) Nasopharyngeal Swab     Status: None   Collection Time: 05/31/21  4:40 AM   Specimen: Nasopharyngeal Swab; Nasopharyngeal(NP) swabs in vial transport medium  Result Value Ref Range   SARS Coronavirus 2 by RT PCR NEGATIVE NEGATIVE    Comment: (NOTE) SARS-CoV-2 target nucleic acids are NOT DETECTED.  The SARS-CoV-2 RNA is generally detectable in upper respiratory specimens during the acute phase of infection. The lowest concentration of SARS-CoV-2 viral copies this assay can detect is 138 copies/mL. A negative result does not preclude SARS-Cov-2 infection and should not be used as the sole basis for treatment or other patient management decisions. A negative result may occur with  improper specimen collection/handling, submission of specimen other than nasopharyngeal swab, presence of viral mutation(s) within the areas targeted by this assay, and inadequate number of viral copies(<138 copies/mL). A negative result must be combined with clinical observations, patient history, and epidemiological information. The expected result is Negative.  Fact Sheet for Patients:  BloggerCourse.com  Fact Sheet for Healthcare Providers:  SeriousBroker.it  This test is no t yet approved or cleared by the [place] FDA and  has been authorized for detection and/or  diagnosis of SARS-CoV-2 by FDA under an Emergency Use Authorization (EUA). This EUA will remain  in effect (meaning this test can be used) for the duration of the COVID-19 declaration under Section 564(b)(1) of the Act, 21 U.S.C.section 360bbb-3(b)(1), unless the authorization is terminated  or revoked sooner.       Influenza A by PCR NEGATIVE NEGATIVE   Influenza B by PCR NEGATIVE NEGATIVE    Comment: (NOTE) The Xpert Xpress SARS-CoV-2/FLU/RSV plus assay is intended as an aid in the diagnosis of influenza from Nasopharyngeal swab specimens and should not be used as a sole basis for treatment. Nasal washings and aspirates are unacceptable for Xpert Xpress SARS-CoV-2/FLU/RSV testing.  Fact Sheet for Patients: BloggerCourse.com  Fact Sheet for Healthcare Providers: SeriousBroker.it  This test is not yet approved or cleared by the [place] FDA and has been authorized for detection and/or diagnosis of SARS-CoV-2 by FDA under an Emergency Use Authorization (EUA). This EUA will remain in effect (meaning this test can be used) for the duration of the COVID-19 declaration under Section 564(b)(1) of the Act, 21 U.S.C. section 360bbb-3(b)(1), unless the authorization is terminated or revoked.  Performed at [facility], [address] [place], [place] [address]     Current Facility-Administered Medications  Medication Dose Route Frequency Provider Last Rate Last Admin   albuterol (PROVENTIL) (2.5 MG/3ML) 0.083% nebulizer solution 3 mL  3 mL Nebulization Q6H PRN Sharyn Creamer, MD       buPROPion [facility]) tablet 100 mg  100 mg Oral q AM Sharyn Creamer, MD       divalproex (DEPAKOTE ER) 24 hr tablet 500 mg  500 mg Oral q morning Sharyn Creamer, MD       divalproex (DEPAKOTE ER) 24 hr tablet 750 mg  750 mg Oral QHS Sharyn Creamer, MD       donepezil (ARICEPT) tablet 10 mg  10 mg Oral Daily Sharyn Creamer, MD       [START ON  06/01/2021] glipiZIDE (GLUCOTROL XL) 24 hr tablet 5  mg  5 mg Oral Q breakfast Sharyn Creamer, MD       levothyroxine (SYNTHROID) tablet 50 mcg  50 mcg Oral Q0600 Sharyn Creamer, MD       metFORMIN (GLUCOPHAGE-XR) 24 hr tablet 1,000 mg  1,000 mg Oral BID WC Sharyn Creamer, MD       simvastatin (ZOCOR) tablet 20 mg  20 mg Oral QHS Sharyn Creamer, MD       Current Outpatient Medications  Medication Sig Dispense Refill   buPROPion (WELLBUTRIN) 100 MG tablet Take 100 mg by mouth in the morning.     divalproex (DEPAKOTE ER) 250 MG 24 hr tablet Take 3 tablets (750 mg total) by mouth at bedtime. 90 tablet 1   divalproex (DEPAKOTE ER) 500 MG 24 hr tablet Take 1 tablet (500 mg total) by mouth every morning. 30 tablet 1   donepezil (ARICEPT) 10 MG tablet Take 1 tablet (10 mg total) by mouth daily. 30 tablet 1   glipiZIDE (GLUCOTROL XL) 10 MG 24 hr tablet Take 1 tablet (10 mg total) by mouth daily with breakfast. 30 tablet 1   levothyroxine (SYNTHROID) 50 MCG tablet Take 1 tablet (50 mcg total) by mouth daily at 6 (six) AM. 30 tablet 1   metformin (FORTAMET) 1000 MG (OSM) 24 hr tablet Take 1 tablet (1,000 mg total) by mouth 2 (two) times daily with a meal. 60 tablet 1   paliperidone (INVEGA SUSTENNA) 156 MG/ML SUSY injection Inject 1 mL (156 mg total) into the muscle every 28 (twenty-eight) days. 1.2 mL 1   paliperidone (INVEGA) 9 MG 24 hr tablet Take 1 tablet (9 mg total) by mouth daily. 30 tablet 1   simvastatin (ZOCOR) 20 MG tablet Take 1 tablet (20 mg total) by mouth at bedtime. 30 tablet 1   sitaGLIPtin (JANUVIA) 100 MG tablet Take 1 tablet (100 mg total) by mouth daily. 30 tablet 1   albuterol (VENTOLIN HFA) 108 (90 Base) MCG/ACT inhaler Inhale 2 puffs into the lungs every 6 (six) hours as needed for wheezing or shortness of breath. (Patient not taking: No sig reported) 8 g 1   clonazePAM (KLONOPIN) 0.5 MG tablet Take 1 tablet (0.5 mg total) by mouth 2 (two) times daily. (Patient not taking: No sig reported) 60  tablet 1   temazepam (RESTORIL) 15 MG capsule Take 1 capsule (15 mg total) by mouth at bedtime as needed for sleep. (Patient not taking: No sig reported) 30 capsule 1    Musculoskeletal: Strength & Muscle Tone: within normal limits Gait & Station: normal Patient leans: N/A            Psychiatric Specialty Exam:  Presentation  General Appearance: Bizarre  Eye Contact:Poor  Speech:Pressured  Speech Volume:Increased  Handedness:Right   Mood and Affect  Mood:Angry; Anxious; Irritable  Affect:Inappropriate; Full Range; Congruent   Thought Process  Thought Processes:Disorganized  Descriptions of Associations:Loose  Orientation:Full (Time, Place and Person)  Thought Content:Illogical  History of Schizophrenia/Schizoaffective disorder:Yes  Duration of Psychotic Symptoms:Greater than six months  Hallucinations:Hallucinations: Auditory Description of Auditory Hallucinations: Voiced, telling him not to do things.  Ideas of Reference:Paranoia; Delusions  Suicidal Thoughts:Suicidal Thoughts: No  Homicidal Thoughts:Homicidal Thoughts: No   Sensorium  Memory:Immediate Fair; Recent Fair; Remote Fair  Judgment:Poor  Insight:Poor   Executive Functions  Concentration:Poor  Attention Span:Poor  Recall:Poor  Fund of Knowledge:Poor  Language:Fair   Psychomotor Activity  Psychomotor Activity:Psychomotor Activity: Normal   Assets  Assets:Communication Skills; Desire for Improvement; Housing; Resilience; Social Support  Sleep  Sleep:Sleep: Poor   Physical Exam: Physical Exam Vitals and nursing note reviewed.  Constitutional:      Appearance: Normal appearance.  HENT:     Head: Normocephalic and atraumatic.     Mouth/Throat:     Pharynx: Oropharynx is clear.  Eyes:     Pupils: Pupils are equal, round, and reactive to light.  Cardiovascular:     Rate and Rhythm: Normal rate and regular rhythm.  Pulmonary:     Effort: Pulmonary effort  is normal.     Breath sounds: Normal breath sounds.  Abdominal:     General: Abdomen is flat.     Palpations: Abdomen is soft.  Musculoskeletal:        General: Normal range of motion.  Skin:    General: Skin is warm and dry.  Neurological:     General: No focal deficit present.     Mental Status: He is alert. Mental status is at baseline.  Psychiatric:        Attention and Perception: He is inattentive. He perceives auditory hallucinations.        Mood and Affect: Mood normal. Affect is labile and inappropriate.        Speech: Speech is tangential.        Behavior: Behavior is agitated. Behavior is not aggressive.        Thought Content: Thought content is paranoid.        Cognition and Memory: Memory is impaired.        Judgment: Judgment is impulsive.   Review of Systems  Constitutional: Negative.   HENT: Negative.    Eyes: Negative.   Respiratory: Negative.    Cardiovascular: Negative.   Gastrointestinal: Negative.   Musculoskeletal: Negative.   Skin: Negative.   Neurological: Negative.   Psychiatric/Behavioral:  Positive for hallucinations. The patient is nervous/anxious and has insomnia.   Blood pressure 136/84, pulse (!) 109, temperature 98.6 F (37 C), temperature source Oral, resp. rate 19, height 5\' 8"  (1.727 m), weight 66.7 kg, SpO2 93 %. Body mass index is 22.36 kg/m.  Treatment Plan Summary: Medication management and Plan labs reviewed.  Blood sugars reasonably okay but could be better.  Labs will be checked and we will restart his outpatient medicines as best we can.  I will text the ACT team to let them know he is here.  Case reviewed with inpatient team.  Recommend psychiatric hospitalization.  Patient agreeable to plan.  Disposition: Recommend psychiatric Inpatient admission when medically cleared. Supportive therapy provided about ongoing stressors. Discussed crisis plan, support from social network, calling 911, coming to the Emergency Department, and  calling Suicide Hotline.  , MD 05/31/2021 11:38 AM

## 2021-05-31 NOTE — ED Notes (Signed)
Snack and beverage given. 

## 2021-05-31 NOTE — BH Assessment (Signed)
Patient is to be admitted to [facility] by Dr. Toni Amend.  Attending Physician will be Dr. Neale Burly.   Patient has been assigned to room 319, by [facility] Charge Nurse Megan.   ER staff is aware of the admission: Nitchia, ER Secretary   Dr. Fanny Bien, ER MD  Thurston Hole, Patient's Nurse  Donella Stade., Patient Access.

## 2021-05-31 NOTE — Tx Team (Signed)
Initial Treatment Plan 05/31/2021 6:28 PM Justin Weeks [id number]:[id number]    PATIENT STRESSORS: Legal issue   Medication change or noncompliance     PATIENT STRENGTHS: Communication skills  Supportive family/friends    PATIENT IDENTIFIED PROBLEMS: Stressor with living arrangements  Stressors with finances                   DISCHARGE CRITERIA:  Adequate post-discharge living arrangements Improved stabilization in mood, thinking, and/or behavior Verbal commitment to aftercare and medication compliance  PRELIMINARY DISCHARGE PLAN: Placement in alternative living arrangements  PATIENT/FAMILY INVOLVEMENT: This treatment plan has been presented to and reviewed with the patient Justin Weeks. The patient have been given the opportunity to ask questions and make suggestions.  [address] [place] 05/31/2021, 6:28 PM

## 2021-05-31 NOTE — Consult Note (Signed)
[facility] Face-to-Face Psychiatry Consult   Reason for Consult:No chief complaint on file. Referring Physician: Dr. Elesa Massed Patient Identification: Justin Weeks MRN:  [id number] Principal Diagnosis: <principal problem not specified> Diagnosis:  Active Problems:   Diabetes (HCC)   Noncompliance   Schizoaffective disorder, bipolar type (HCC)   HTN (hypertension)   Tobacco use disorder   Suicidal ideation   Schizoaffective disorder (HCC)   Schizophrenia (HCC)   Total Time spent with patient: 30 minutes  Subjective: "Why your coming in here and asking me questions?" Justin Weeks is a 60 y.o. male patient presented to [facility] ED via law enforcement voluntarily. Per the triage nurse note, "Pt to ED, pt states came by [employer] however no [employer] officers came to the desk with patient. Pt repeatedly stated that he needed to go to the psychiatric ward, "I been up there before, I need to go to the psychiatric ward." Pt becomes agitated when asked triage questions at this time. Pt states, "I need help." Pt able to give this RN the [employer] phone number.    Pt denies SI/HI. Pt states he is not going back to the "haunted place"; pt states, "if y'all turn me back, then I'll be back to the streets." Pt states that he has not been taking his medication and has been forgetting to take his medication at home."  The patient became irate during his assessment when he was asked questions. The patient was able to be redirected. The patient was seen face-to-face by this provider; the chart was reviewed and consulted with Dr. Elesa Massed on 05/31/2021 due to the patient's care. It was discussed with the EDP that the patient does meet the criteria to be admitted to the psychiatric inpatient unit.  On evaluation, the patient is alert and oriented x4, calm and cooperative, and mood-congruent with affect. The patient does not appear to be responding to internal or external stimuli. The patient is presenting with some delusional  thinking. The patient admits to auditory and visual hallucinations. The patient denies any suicidal, homicidal, or self-harm ideations. The patient is presenting with some psychotic and paranoid behaviors. During an encounter with the patient, he refused to answer most questions.  HPI: Per Dr. Elesa Massed, Justin Weeks is a 60 y.o. male with history of hypertension, hypothyroidism, diabetes, schizoaffective disorder who presents to the emergency department requesting a psychiatric evaluation.  Patient tells me that he is hearing voices.  States he they are telling him "to do things I might regret".  He denies SI, HI.  States he is not taking his medications.  Denies drug or alcohol use.  Past Psychiatric History:  Schizoaffective disorder (HCC)   Seizures (HCC)  Risk to Self:   Risk to Others:   Prior Inpatient Therapy:   Prior Outpatient Therapy:    Past Medical History:  Past Medical History:  Diagnosis Date   Arthritis    Asthma    Blood transfusion without reported diagnosis    Diabetes mellitus without complication (HCC)    Hypertension    Hypothyroid 07/03/2015   Personal history of tobacco use, presenting hazards to health 05/31/2015   Schizoaffective disorder (HCC)    Seizures (HCC)    History reviewed. No pertinent surgical history. Family History: History reviewed. No pertinent family history. Family Psychiatric  History:  Social History:  Social History   Substance and Sexual Activity  Alcohol Use No     Social History   Substance and Sexual Activity  Drug Use Yes  Types: Cocaine    Social History   Socioeconomic History   Marital status: Divorced    Spouse name: Not on file   Number of children: Not on file   Years of education: Not on file   Highest education level: Not on file  Occupational History   Not on file  Tobacco Use   Smoking status: Every Day    Packs/day: 1.00    Years: 40.00    Pack years: 40.00    Types: Cigarettes   Smokeless tobacco:  Never  Vaping Use   Vaping Use: Unknown  Substance and Sexual Activity   Alcohol use: No   Drug use: Yes    Types: Cocaine   Sexual activity: Yes  Other Topics Concern   Not on file  Social History Narrative   Not on file   Social Determinants of Health   Financial Resource Strain: Not on file  Food Insecurity: Not on file  Transportation Needs: Not on file  Physical Activity: Not on file  Stress: Not on file  Social Connections: Not on file   Additional Social History:    Allergies:   Allergies  Allergen Reactions   Haldol [Haloperidol] Other (See Comments)    Reaction:  Agitation  Pt states that he also experiences lockjaw from this medication.      Labs:  Results for orders placed or performed during the hospital encounter of 05/31/21 (from the past 48 hour(s))  Comprehensive metabolic panel     Status: Abnormal   Collection Time: 05/31/21  3:57 AM  Result Value Ref Range   Sodium 138 135 - 145 mmol/L   Potassium 3.8 3.5 - 5.1 mmol/L   Chloride 100 98 - 111 mmol/L   CO2 29 22 - 32 mmol/L   Glucose, Bld 144 (H) 70 - 99 mg/dL    Comment: Glucose reference range applies only to samples taken after fasting for at least 8 hours.   BUN 14 8 - 23 mg/dL   Creatinine, Ser 0.86 0.61 - 1.24 mg/dL   Calcium 9.0 8.9 - 76.1 mg/dL   Total Protein 7.9 6.5 - 8.1 g/dL   Albumin 3.9 3.5 - 5.0 g/dL   AST 22 15 - 41 U/L   ALT 15 0 - 44 U/L   Alkaline Phosphatase 73 38 - 126 U/L   Total Bilirubin 0.5 0.3 - 1.2 mg/dL   GFR, Estimated >95 >09 mL/min    Comment: (NOTE) Calculated using the CKD-EPI Creatinine Equation (2021)    Anion gap 9 5 - 15    Comment: Performed at [facility], [address] [place], [place] [address]  Ethanol     Status: None   Collection Time: 05/31/21  3:57 AM  Result Value Ref Range   Alcohol, Ethyl (B) <10 <10 mg/dL    Comment: (NOTE) Lowest detectable limit for serum alcohol is 10 mg/dL.  For medical purposes only. Performed at  [facility], [address] [place], [place] [address]   Salicylate level     Status: Abnormal   Collection Time: 05/31/21  3:57 AM  Result Value Ref Range   Salicylate Lvl <7.0 (L) 7.0 - 30.0 mg/dL    Comment: Performed at [facility], [address] [place], [place] [address]  Acetaminophen level     Status: Abnormal   Collection Time: 05/31/21  3:57 AM  Result Value Ref Range   Acetaminophen (Tylenol), Serum <10 (L) 10 - 30 ug/mL    Comment: (  NOTE) Therapeutic concentrations vary significantly. A range of 10-30 ug/mL  may be an effective concentration for many patients. However, some  are best treated at concentrations outside of this range. Acetaminophen concentrations >150 ug/mL at 4 hours after ingestion  and >50 ug/mL at 12 hours after ingestion are often associated with  toxic reactions.  Performed at [facility], [address] [place], [place] [address]   cbc     Status: None   Collection Time: 05/31/21  3:57 AM  Result Value Ref Range   WBC 4.8 4.0 - 10.5 K/uL   RBC 4.80 4.22 - 5.81 MIL/uL   Hemoglobin 13.8 13.0 - 17.0 g/dL   HCT 40.9 73.5 - 32.9 %   MCV 83.5 80.0 - 100.0 fL   MCH 28.8 26.0 - 34.0 pg   MCHC 34.4 30.0 - 36.0 g/dL   RDW 92.4 26.8 - 34.1 %   Platelets 232 150 - 400 K/uL   nRBC 0.0 0.0 - 0.2 %    Comment: Performed at [facility], [address] [place], [place] [address]    No current facility-administered medications for this encounter.   Current Outpatient Medications  Medication Sig Dispense Refill   albuterol (VENTOLIN HFA) 108 (90 Base) MCG/ACT inhaler Inhale 2 puffs into the lungs every 6 (six) hours as needed for wheezing or shortness of breath. 8 g 1   clonazePAM (KLONOPIN) 0.5 MG tablet Take 1 tablet (0.5 mg total) by mouth 2 (two) times daily. 60 tablet 1   divalproex (DEPAKOTE ER) 250 MG 24 hr tablet Take 3 tablets (750 mg total) by mouth at bedtime. 90 tablet 1   divalproex (DEPAKOTE ER)  500 MG 24 hr tablet Take 1 tablet (500 mg total) by mouth every morning. 30 tablet 1   donepezil (ARICEPT) 10 MG tablet Take 1 tablet (10 mg total) by mouth daily. 30 tablet 1   glipiZIDE (GLUCOTROL XL) 10 MG 24 hr tablet Take 1 tablet (10 mg total) by mouth daily with breakfast. 30 tablet 1   levothyroxine (SYNTHROID) 50 MCG tablet Take 1 tablet (50 mcg total) by mouth daily at 6 (six) AM. 30 tablet 1   metformin (FORTAMET) 1000 MG (OSM) 24 hr tablet Take 1 tablet (1,000 mg total) by mouth 2 (two) times daily with a meal. 60 tablet 1   paliperidone (INVEGA SUSTENNA) 156 MG/ML SUSY injection Inject 1 mL (156 mg total) into the muscle every 28 (twenty-eight) days. 1.2 mL 1   paliperidone (INVEGA) 9 MG 24 hr tablet Take 1 tablet (9 mg total) by mouth daily. 30 tablet 1   simvastatin (ZOCOR) 20 MG tablet Take 1 tablet (20 mg total) by mouth at bedtime. 30 tablet 1   sitaGLIPtin (JANUVIA) 100 MG tablet Take 1 tablet (100 mg total) by mouth daily. 30 tablet 1   temazepam (RESTORIL) 15 MG capsule Take 1 capsule (15 mg total) by mouth at bedtime as needed for sleep. 30 capsule 1    Musculoskeletal: Strength & Muscle Tone: within normal limits Gait & Station: normal Patient leans: N/A  Psychiatric Specialty Exam:  Presentation  General Appearance: Bizarre  Eye Contact:Poor  Speech:Pressured  Speech Volume:Increased  Handedness:Right   Mood and Affect  Mood:Angry; Anxious; Irritable  Affect:Inappropriate; Full Range; Congruent   Thought Process  Thought Processes:Disorganized  Descriptions of Associations:Loose  Orientation:Full (Time, Place and Person)  Thought Content:Illogical  History of Schizophrenia/Schizoaffective disorder:No data recorded Duration of Psychotic Symptoms:No data recorded Hallucinations:Hallucinations: Auditory Description of Auditory  Hallucinations: Voiced, telling him not to do things.  Ideas of Reference:Paranoia; Delusions  Suicidal  Thoughts:Suicidal Thoughts: No  Homicidal Thoughts:Homicidal Thoughts: No   Sensorium  Memory:Immediate Fair; Recent Fair; Remote Fair  Judgment:Poor  Insight:Poor   Executive Functions  Concentration:Poor  Attention Span:Poor  Recall:Poor  Fund of Knowledge:Poor  Language:Fair   Psychomotor Activity  Psychomotor Activity:Psychomotor Activity: Normal   Assets  Assets:Communication Skills; Desire for Improvement; Housing; Resilience; Social Support   Sleep  Sleep:Sleep: Poor   Physical Exam: Physical Exam Vitals and nursing note reviewed.  Constitutional:      Appearance: He is normal weight.  HENT:     Head: Normocephalic and atraumatic.     Nose: Nose normal.     Mouth/Throat:     Mouth: Mucous membranes are dry.  Cardiovascular:     Rate and Rhythm: Tachycardia present.  Pulmonary:     Effort: Pulmonary effort is normal.  Musculoskeletal:        General: Normal range of motion.     Cervical back: Normal range of motion and neck supple.  Neurological:     Mental Status: He is alert and oriented to person, place, and time. Mental status is at baseline.  Psychiatric:        Attention and Perception: He perceives auditory and visual hallucinations.        Mood and Affect: Mood is anxious. Affect is angry and inappropriate.        Speech: Speech is rapid and pressured and tangential.        Behavior: Behavior is uncooperative and agitated.        Thought Content: Thought content is paranoid and delusional.        Cognition and Memory: Cognition is impaired.        Judgment: Judgment is impulsive and inappropriate.   Review of Systems  Psychiatric/Behavioral:  Positive for hallucinations. The patient is nervous/anxious.   All other systems reviewed and are negative. Blood pressure 136/84, pulse (!) 109, temperature 98.6 F (37 C), temperature source Oral, resp. rate 19, height 5\' 8"  (1.727 m), weight 66.7 kg, SpO2 93 %. Body mass index is 22.36  kg/m.  Treatment Plan Summary: Plan The patient is a safety risk to himself and does not require psychiatric inpatient admission for stabilization and treatment.  Disposition: Recommend psychiatric Inpatient admission when medically cleared. Supportive therapy provided about ongoing stressors.  , NP 05/31/2021 5:23 AM

## 2021-05-31 NOTE — Group Note (Signed)
[facility] LCSW Group Therapy Note   Group Date: 05/31/2021 Start Time: 1314 End Time: 1420  Type of Therapy and Topic:  Group Therapy:  Feelings around Relapse and Recovery  Participation Level:  Did Not Attend   Description of Group:    Patients in this group will discuss emotions they experience before and after a relapse. They will process how experiencing these feelings, or avoidance of experiencing them, relates to having a relapse. Facilitator will guide patients to explore emotions they have related to recovery. Patients will be encouraged to process which emotions are more powerful. They will be guided to discuss the emotional reaction significant others in their lives may have to patients' relapse or recovery. Patients will be assisted in exploring ways to respond to the emotions of others without this contributing to a relapse.  Therapeutic Goals: Patient will identify two or more emotions that lead to relapse for them:  Patient will identify two emotions that result when they relapse:  Patient will identify two emotions related to recovery:  Patient will demonstrate ability to communicate their needs through discussion and/or role plays.   Summary of Patient Progress: X   Therapeutic Modalities:   Cognitive Behavioral Therapy Solution-Focused Therapy Assertiveness Training Relapse Prevention Therapy   Shatima Zalar R Deziyah Arvin, LCSW 

## 2021-05-31 NOTE — ED Provider Notes (Signed)
Pharm tech obtained med rec from pharmacy.  Home meds reordered.   Sharyn Creamer, MD 05/31/21 [phone]
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
## 2021-05-31 NOTE — Plan of Care (Signed)
  Problem: Education: Goal: Knowledge of [place] [facility] Education information/materials will improve Outcome: Not Met (add Reason) Goal: Emotional status will improve Outcome: Not Met (add Reason) Goal: Mental status will improve Outcome: Not Met (add Reason) Goal: Verbalization of understanding the information provided will improve Outcome: Not Met (add Reason)   Problem: Activity: Goal: Will verbalize the importance of balancing activity with adequate rest periods Outcome: Not Met (add Reason)   Problem: Education: Goal: Knowledge of the prescribed therapeutic regimen will improve Outcome: Not Met (add Reason)   Problem: Coping: Goal: Coping ability will improve Outcome: Not Met (add Reason) Goal: Will verbalize feelings Outcome: Not Met (add Reason)   Problem: Coping: Goal: Coping ability will improve Outcome: Not Met (add Reason) Goal: Will verbalize feelings Outcome: Not Met (add Reason)   Problem: Health Behavior/Discharge Planning: Goal: Compliance with prescribed medication regimen will improve Outcome: Not Met (add Reason)   Problem: Health Behavior/Discharge Planning: Goal: Compliance with prescribed medication regimen will improve Outcome: Not Met (add Reason)   Problem: Nutritional: Goal: Ability to achieve adequate nutritional intake will improve Outcome: Not Met (add Reason)

## 2021-05-31 NOTE — Progress Notes (Signed)
Admission Progress Note    Patient admitted to BMU with Dx of psychosis. Report received states patient endorses VH of ghost in his home. Patient is extremely agitated on admission and refused to cooperate with nurse. Yelling and screaming and needed to be redirected. Allowed RNs x2 to complete skin assessment. Took po medicine. Denies SI/HI/AH/VH, pain, depression and anxiety. Refused to allow RN to orient him to unit. States, "I can find my room." Denies medical history, alcohol and drug use. Informs RN he is only here to find placement and he is not going back to home. Gives RN contact numbers for [employer] and Payee. RN instructed patient to give payee number and code to communicate during admission. Patient verbalize agreement. Cont Q 15 minute check for safety.   [address] Contact : [phone] Payee: Cristal Deer: [phone]/ c: [phone]

## 2021-06-01 LAB — HEMOGLOBIN A1C
Hgb A1c MFr Bld: 8 % — ABNORMAL HIGH (ref 4.8–5.6)
Mean Plasma Glucose: 183 mg/dL

## 2021-06-01 MED ORDER — TRAZODONE HCL 50 MG PO TABS
50.0000 mg | ORAL_TABLET | Freq: Every evening | ORAL | Status: DC | PRN
  Administered 2021-06-01 – 2021-06-05 (×5): 50 mg via ORAL
  Filled 2021-06-01 (×5): qty 1

## 2021-06-01 NOTE — [facility] Suicide Risk Assessment (Signed)
[facility] Admission Suicide Risk Assessment   Nursing information obtained from:  Patient Demographic factors:  Male Current Mental Status:  NA Loss Factors:  Financial problems / change in socioeconomic status Historical Factors:  NA Risk Reduction Factors:  Positive social support  Total Time spent with patient: 1 hour Principal Problem: <principal problem not specified> Diagnosis:  Active Problems:   Schizoaffective disorder, bipolar type (HCC)  Subjective Data:  60 year old man with chronic mental health problems.  Patient came to the emergency room voluntarily it sounds like he probably called 911 himself.  He says that his apartment is haunted.  There are ghosts in the bedroom and throughout the apartment.  He also hears voices constantly telling him things and making him nervous.  He has been more jittery and agitated and irritable.  Patient was a little ambivalent about whether he had been compliant with all of his medicines.  He does have a [employer] ACT team following up with him.  Patient was not able to describe any other particular stressor going on with him.  He does say that he has been eating and sleeping poorly at home.  Denies suicidal or homicidal ideation.  Denies any alcohol or drug abuse.  Patient was somewhat agitated upon admission to the unit and was yelling/screaming. He did take his oral meds and got calmer afterwards. This morning he presents in somewhat labile mood, states that he wants to go back to his old apartment, that is in the same apartment complex. He acknowledges not taking his meds for a few days before being admitted. He claims to have received his [place] shot a day before he arrived here. He is reporting anxiety and irritable mood, is denying current SI/HI or   current AH. He reports hearing voices of people and some people coming in a out of his apartment. He is denying current AVH, is still somewhat paranoid and grandiose. He did not sleep too well  last night and is willing to take Trazodone for it.   Continued Clinical Symptoms:  Alcohol Use Disorder Identification Test Final Score (AUDIT): 0 The "Alcohol Use Disorders Identification Test", Guidelines for Use in Primary Care, Second Edition.  World Science writer [facility]). Score between 0-7:  no or low risk or alcohol related problems. Score between 8-15:  moderate risk of alcohol related problems. Score between 16-19:  high risk of alcohol related problems. Score 20 or above:  warrants further diagnostic evaluation for alcohol dependence and treatment.   CLINICAL FACTORS:   Schizophrenia:   Paranoid or undifferentiated type   Musculoskeletal: Strength & Muscle Tone: within normal limits Gait & Station: normal Patient leans: N/A  Psychiatric Specialty Exam:  Presentation  General Appearance: Appropriate for Environment; Casual; Disheveled  Eye Contact:Fair  Speech:Garbled  Speech Volume:Increased  Handedness:Right   Mood and Affect  Mood:Anxious; Irritable  Affect:Appropriate; Labile   Thought Process  Thought Processes:Disorganized  Descriptions of Associations:Loose  Orientation:Full (Time, Place and Person)  Thought Content:Paranoid Ideation  History of Schizophrenia/Schizoaffective disorder:Yes  Duration of Psychotic Symptoms:Greater than six months  Hallucinations:Hallucinations: Auditory Description of Auditory Hallucinations: Voiced, telling him not to do things.  Ideas of Reference:Other (comment)  Suicidal Thoughts:Suicidal Thoughts: No  Homicidal Thoughts:Homicidal Thoughts: No   Sensorium  Memory:Immediate Fair  Judgment:Poor  Insight:Poor   Executive Functions  Concentration:Fair  Attention Span:Fair  Recall:Fair  Fund of Knowledge:Fair  Language:Fair   Psychomotor Activity  Psychomotor Activity:Psychomotor Activity: Restlessness   Assets  Assets:Desire for Improvement; Housing; Social Support  Sleep   Sleep:Sleep: Poor    Physical Exam: Physical Exam ROS Blood pressure 110/85, pulse 91, temperature 98.3 F (36.8 C), temperature source Oral, resp. rate 17, height 5\' 8"  (1.727 m), weight 64.9 kg, SpO2 97 %. Body mass index is 21.74 kg/m.   COGNITIVE FEATURES THAT CONTRIBUTE TO RISK:  Polarized thinking    SUICIDE RISK:   Mild:  Suicidal ideation of limited frequency, intensity, duration, and specificity.  There are no identifiable plans, no associated intent, mild dysphoria and related symptoms, good self-control (both objective and subjective assessment), few other risk factors, and identifiable protective factors, including available and accessible social support.  PLAN OF CARE: Continue current meds.   I certify that inpatient services furnished can reasonably be expected to improve the patient's condition.   Justin Weeks 06/01/2021, 9:24 AM

## 2021-06-01 NOTE — H&P (Signed)
Psychiatric Admission Assessment Adult  Patient Identification: Justin Weeks MRN:  [id number] Date of Evaluation:  06/01/2021 Chief Complaint:  Schizoaffective disorder, bipolar type (HCC) [F25.0] Principal Diagnosis: <principal problem not specified> Diagnosis:  Active Problems:   Schizoaffective disorder, bipolar type [facility]) Chief Complaint : " I was going through a lot of stuff in my apartment".   History of Present Illness:  60 year old man with chronic mental health problems.  Patient came to the emergency room voluntarily it sounds like he probably called 911 himself.  He says that his apartment is haunted.  There are ghosts in the bedroom and throughout the apartment.  He also hears voices constantly telling him things and making him nervous.  He has been more jittery and agitated and irritable.  Patient was a little ambivalent about whether he had been compliant with all of his medicines.  He does have a [employer] ACT team following up with him.  Patient was not able to describe any other particular stressor going on with him.  He does say that he has been eating and sleeping poorly at home.  Denies suicidal or homicidal ideation.  Denies any alcohol or drug abuse.  Patient was somewhat agitated upon admission to the unit and was yelling/screaming. He did take his oral meds and got calmer afterwards. This morning he presents in somewhat labile mood, states that he wants to go back to his old apartment, that is in the same apartment complex. He acknowledges not taking his meds for a few days before being admitted. He claims to have received his [place] shot a day before he arrived here. He is reporting anxiety and irritable mood, is denying current SI/HI or   current AH. He reports hearing voices of people and some people coming in a out of his apartment. He is denying current AVH, is still somewhat paranoid and grandiose. He did not sleep too well last night and is willing to take  Trazodone for it.   Associated Signs/Symptoms: Depression Symptoms:  psychomotor agitation, anxiety, Duration of Depression Symptoms: No data recorded (Hypo) Manic Symptoms:  Elevated Mood, Flight of Ideas, Grandiosity, Hallucinations, Irritable Mood, Labiality of Mood, Anxiety Symptoms:  Excessive Worry, Psychotic Symptoms:  Hallucinations: Auditory PTSD Symptoms: Negative Total Time spent with patient: 1 hour  Past Psychiatric History: Chronic mental health problems with several prior hospitalizations.  Has ACT team follow-up.  No known history of suicidal behavior.  When psychotic often will get grumpy and raises voice but does not have a history of significant violence.  Does not abuse substances typically  Is the patient at risk to self? No.  Has the patient been a risk to self in the past 6 months? No.  Has the patient been a risk to self within the distant past? No.  Is the patient a risk to others? Yes.    Has the patient been a risk to others in the past 6 months? No.  Has the patient been a risk to others within the distant past? No.   Prior Inpatient Therapy:   Prior Outpatient Therapy:    Alcohol Screening: Patient refused Alcohol Screening Tool: Yes 1. How often do you have a drink containing alcohol?: Never 2. How many drinks containing alcohol do you have on a typical day when you are drinking?: 1 or 2 3. How often do you have six or more drinks on one occasion?: Never AUDIT-C Score: 0 4. How often during the last year have you found that  you were not able to stop drinking once you had started?: Never 5. How often during the last year have you failed to do what was normally expected from you because of drinking?: Never 6. How often during the last year have you needed a first drink in the morning to get yourself going after a heavy drinking session?: Never 7. How often during the last year have you had a feeling of guilt of remorse after drinking?: Never 8. How  often during the last year have you been unable to remember what happened the night before because you had been drinking?: Never 9. Have you or someone else been injured as a result of your drinking?: No 10. Has a relative or friend or a doctor or another health worker been concerned about your drinking or suggested you cut down?: No Alcohol Use Disorder Identification Test Final Score (AUDIT): 0 Substance Abuse History in the last 12 months:  No. Consequences of Substance Abuse: NA Previous Psychotropic Medications: Yes  Psychological Evaluations: No  Past Medical History:  Past Medical History:  Diagnosis Date   Arthritis    Asthma    Blood transfusion without reported diagnosis    Diabetes mellitus without complication (HCC)    Hypertension    Hypothyroid 07/03/2015   Personal history of tobacco use, presenting hazards to health 05/31/2015   Schizoaffective disorder (HCC)    Seizures (HCC)    History reviewed. No pertinent surgical history. Family History: History reviewed. No pertinent family history. Family Psychiatric  History: [place] Tobacco Screening:  1 PPD Social History:  Social History   Substance and Sexual Activity  Alcohol Use No     Social History   Substance and Sexual Activity  Drug Use Yes   Types: Cocaine    Additional Social History:     Lives alone With ACT team Disabled Has one child     Allergies:   Allergies  Allergen Reactions   Haldol [Haloperidol] Other (See Comments)    Reaction:  Agitation  Pt states that he also experiences lockjaw from this medication.     Lab Results:  Results for orders placed or performed during the hospital encounter of 05/31/21 (from the past 48 hour(s))  Lipid panel     Status: Abnormal   Collection Time: 05/31/21  3:44 PM  Result Value Ref Range   Cholesterol 128 0 - 200 mg/dL   Triglycerides 72 <213 mg/dL   HDL 34 (L) >08 mg/dL   Total CHOL/HDL Ratio 3.8 RATIO   VLDL 14 0 - 40 mg/dL   LDL Cholesterol  80 0 - 99 mg/dL    Comment:        Total Cholesterol/HDL:CHD Risk Coronary Heart Disease Risk Table                     Men   Women  1/2 Average Risk   3.4   3.3  Average Risk       5.0   4.4  2 X Average Risk   9.6   7.1  3 X Average Risk  23.4   11.0        Use the calculated Patient Ratio above and the CHD Risk Table to determine the patient's CHD Risk.        ATP III CLASSIFICATION (LDL):  <100     mg/dL   Optimal  657-846  mg/dL   Near or Above  Optimal  130-159  mg/dL   Borderline  295-188  mg/dL   High  >416     mg/dL   Very High Performed at [facility], [address] [place], [place] [address]   Glucose, capillary     Status: Abnormal   Collection Time: 05/31/21  4:55 PM  Result Value Ref Range   Glucose-Capillary 251 (H) 70 - 99 mg/dL    Comment: Glucose reference range applies only to samples taken after fasting for at least 8 hours.    Blood Alcohol level:  Lab Results  Component Value Date   ETH <10 05/31/2021   ETH <10 03/07/2020    Metabolic Disorder Labs:  Lab Results  Component Value Date   HGBA1C 9.0 (H) 02/20/2020   MPG 212 02/20/2020   Lab Results  Component Value Date   PROLACTIN 21.9 (H) 10/18/2019   Lab Results  Component Value Date   CHOL 128 05/31/2021   TRIG 72 05/31/2021   HDL 34 (L) 05/31/2021   CHOLHDL 3.8 05/31/2021   VLDL 14 05/31/2021   LDLCALC 80 05/31/2021   LDLCALC 71 05/31/2021    Current Medications: Current Facility-Administered Medications  Medication Dose Route Frequency Provider Last Rate Last Admin   acetaminophen (TYLENOL) tablet 650 mg  650 mg Oral Q6H PRN Clapacs, John T, MD       albuterol (PROVENTIL) (2.5 MG/3ML) 0.083% nebulizer solution 3 mL  3 mL Nebulization Q6H PRN Clapacs, John T, MD       alum & mag hydroxide-simeth (MAALOX/MYLANTA) 200-200-20 MG/5ML suspension 30 mL  30 mL Oral Q4H PRN Clapacs, Jackquline Denmark, MD       buPROPion [facility]) tablet 100 mg  100 mg Oral q AM  Clapacs, John T, MD   100 mg at 06/01/21 1601   divalproex (DEPAKOTE ER) 24 hr tablet 500 mg  500 mg Oral q morning Clapacs, John T, MD       divalproex (DEPAKOTE ER) 24 hr tablet 750 mg  750 mg Oral QHS Clapacs, John T, MD   750 mg at 05/31/21 2119   donepezil (ARICEPT) tablet 10 mg  10 mg Oral Daily Clapacs, Jackquline Denmark, MD   10 mg at 06/01/21 0932   glipiZIDE (GLUCOTROL XL) 24 hr tablet 5 mg  5 mg Oral Q breakfast Clapacs, Jackquline Denmark, MD   5 mg at 06/01/21 3557   hydrOXYzine (ATARAX/VISTARIL) tablet 50 mg  50 mg Oral TID PRN Clapacs, Jackquline Denmark, MD       levothyroxine (SYNTHROID) tablet 50 mcg  50 mcg Oral Q0600 Clapacs, Jackquline Denmark, MD   50 mcg at 06/01/21 0648   linagliptin (TRADJENTA) tablet 5 mg  5 mg Oral Daily Clapacs, Jackquline Denmark, MD   5 mg at 06/01/21 3220   magnesium hydroxide (MILK OF MAGNESIA) suspension 30 mL  30 mL Oral Daily PRN Clapacs, Jackquline Denmark, MD       metFORMIN (GLUCOPHAGE-XR) 24 hr tablet 1,000 mg  1,000 mg Oral BID WC Clapacs, John T, MD   1,000 mg at 06/01/21 2542   simvastatin (ZOCOR) tablet 20 mg  20 mg Oral QHS Clapacs, John T, MD   20 mg at 05/31/21 2118   PTA Medications: Medications Prior to Admission  Medication Sig Dispense Refill Last Dose   albuterol (VENTOLIN HFA) 108 (90 Base) MCG/ACT inhaler Inhale 2 puffs into the lungs every 6 (six) hours as needed for wheezing or shortness of breath. (Patient not taking: No sig reported) 8 g  1    buPROPion (WELLBUTRIN) 100 MG tablet Take 100 mg by mouth in the morning.      clonazePAM (KLONOPIN) 0.5 MG tablet Take 1 tablet (0.5 mg total) by mouth 2 (two) times daily. (Patient not taking: No sig reported) 60 tablet 1    divalproex (DEPAKOTE ER) 250 MG 24 hr tablet Take 3 tablets (750 mg total) by mouth at bedtime. 90 tablet 1    divalproex (DEPAKOTE ER) 500 MG 24 hr tablet Take 1 tablet (500 mg total) by mouth every morning. 30 tablet 1    donepezil (ARICEPT) 10 MG tablet Take 1 tablet (10 mg total) by mouth daily. 30 tablet 1    glipiZIDE  (GLUCOTROL XL) 10 MG 24 hr tablet Take 1 tablet (10 mg total) by mouth daily with breakfast. 30 tablet 1    levothyroxine (SYNTHROID) 50 MCG tablet Take 1 tablet (50 mcg total) by mouth daily at 6 (six) AM. 30 tablet 1    metformin (FORTAMET) 1000 MG (OSM) 24 hr tablet Take 1 tablet (1,000 mg total) by mouth 2 (two) times daily with a meal. 60 tablet 1    paliperidone (INVEGA SUSTENNA) 156 MG/ML SUSY injection Inject 1 mL (156 mg total) into the muscle every 28 (twenty-eight) days. 1.2 mL 1    paliperidone (INVEGA) 9 MG 24 hr tablet Take 1 tablet (9 mg total) by mouth daily. 30 tablet 1    simvastatin (ZOCOR) 20 MG tablet Take 1 tablet (20 mg total) by mouth at bedtime. 30 tablet 1    sitaGLIPtin (JANUVIA) 100 MG tablet Take 1 tablet (100 mg total) by mouth daily. 30 tablet 1    temazepam (RESTORIL) 15 MG capsule Take 1 capsule (15 mg total) by mouth at bedtime as needed for sleep. (Patient not taking: No sig reported) 30 capsule 1     Musculoskeletal: Strength & Muscle Tone: within normal limits Gait & Station: normal Patient leans: N/A    Psychiatric Specialty Exam:  Presentation  General Appearance: Appropriate for Environment; Casual; Disheveled  Eye Contact:Fair  Speech:Garbled  Speech Volume:Increased  Handedness:Right   Mood and Affect  Mood:Anxious; Irritable  Affect:Appropriate; Labile   Thought Process  Thought Processes:Disorganized  Duration of Psychotic Symptoms: Greater than six months  Past Diagnosis of Schizophrenia or Psychoactive disorder: Yes  Descriptions of Associations:Loose  Orientation:Full (Time, Place and Person)  Thought Content:Paranoid Ideation  Hallucinations:Hallucinations: Auditory Description of Auditory Hallucinations: Voiced, telling him not to do things.  Ideas of Reference:Other (comment)  Suicidal Thoughts:Suicidal Thoughts: No  Homicidal Thoughts:Homicidal Thoughts: No   Sensorium  Memory:Immediate  Fair  Judgment:Poor  Insight:Poor   Executive Functions  Concentration:Fair  Attention Span:Fair  Recall:Fair  Fund of Knowledge:Fair  Language:Fair   Psychomotor Activity  Psychomotor Activity:Psychomotor Activity: Restlessness   Assets  Assets:Desire for Improvement; Housing; Social Support   Sleep  Sleep:Sleep: Poor    Physical Exam: Physical Exam Constitutional:      Appearance: Normal appearance. He is normal weight.  HENT:     Head: Normocephalic and atraumatic.     Nose: Nose normal.     Mouth/Throat:     Mouth: Mucous membranes are moist.     Pharynx: Oropharynx is clear.  Eyes:     Extraocular Movements: Extraocular movements intact.     Conjunctiva/sclera: Conjunctivae normal.     Pupils: Pupils are equal, round, and reactive to light.  Cardiovascular:     Rate and Rhythm: Normal rate and regular rhythm.  Pulmonary:  Effort: Pulmonary effort is normal.  Abdominal:     General: Abdomen is flat. Bowel sounds are normal.     Palpations: Abdomen is soft.  Musculoskeletal:        General: Normal range of motion.     Cervical back: Normal range of motion.  Skin:    General: Skin is warm and dry.  Neurological:     General: No focal deficit present.     Mental Status: He is alert and oriented to person, place, and time.   Review of Systems  Constitutional: Negative.   HENT: Negative.    Eyes: Negative.   Respiratory: Negative.    Cardiovascular: Negative.   Gastrointestinal: Negative.   Genitourinary: Negative.   Musculoskeletal: Negative.   Skin: Negative.   Neurological: Negative.   Endo/Heme/Allergies: Negative.   Psychiatric/Behavioral:  Positive for hallucinations. The patient is nervous/anxious and has insomnia.   Blood pressure 110/85, pulse 91, temperature 98.3 F (36.8 C), temperature source Oral, resp. rate 17, height 5\' 8"  (1.727 m), weight 64.9 kg, SpO2 97 %. Body mass index is 21.74 kg/m.  Treatment Plan Summary: Daily  contact with patient to assess and evaluate symptoms and progress in treatment, Medication management, and Plan : Continue Depakote and Confirm administration of shot.   Observation Level/Precautions:  15 minute checks  Laboratory:   None currently  Psychotherapy:    Medications:    Consultations:    Discharge Concerns:    Estimated LOS:  Other:     Physician Treatment Plan for Primary Diagnosis: <principal problem not specified> Long Term Goal(s): Improvement in symptoms so as ready for discharge  Short Term Goals: Ability to verbalize feelings will improve, Ability to demonstrate self-control will improve, and Compliance with prescribed medications will improve  Physician Treatment Plan for Secondary Diagnosis: Active Problems:   Schizoaffective disorder, bipolar type (HCC)  Long Term Goal(s): Improvement in symptoms so as ready for discharge  Short Term Goals: Ability to identify changes in lifestyle to reduce recurrence of condition will improve, Ability to verbalize feelings will improve, Ability to identify and develop effective coping behaviors will improve, and Ability to maintain clinical measurements within normal limits will improve  I certify that inpatient services furnished can reasonably be expected to improve the patient's condition.    Corinda Ammon 10/1/20228:45 AM

## 2021-06-01 NOTE — Progress Notes (Signed)
D: Patient presented as restless with a flat affect. Patient stated "I don't know what to do" when asked if he was hearing voices. Patient presents as mildly confused. Pt denies SI and HI at this time. Pt denies AVH at this time.   A: Administered medications as per MAR orders. Provided pt with emotional support. Encouraged pt to come to staff with any concerns.   R: Pt is calm and cooperative. Pt remains safe on the unit at this time.    06/01/21 2005  Psych Admission Type (Psych Patients Only)  Admission Status Voluntary  Psychosocial Assessment  Patient Complaints None  Eye Contact Fair  Facial Expression Sullen  Affect Irritable  Speech Argumentative  Interaction Forwards little  Motor Activity Slow  Appearance/Hygiene Unremarkable  Behavior Characteristics Combative  Mood Sullen  Thought Process  Coherency Disorganized  Content WDL  Delusions None reported or observed  Perception WDL  Judgment Impaired  Confusion Mild  Danger to Self  Current suicidal ideation? Denies  Danger to Others  Danger to Others None reported or observed
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
## 2021-06-01 NOTE — Group Note (Signed)
LCSW Group Therapy Note  Group Date: 06/01/2021 Start Time: 1300 End Time: 1400   Type of Therapy and Topic:  Group Therapy - Healthy vs Unhealthy Coping Skills  Participation Level:  Minimal   Description of Group The focus of this group was to determine what unhealthy coping techniques typically are used by group members and what healthy coping techniques would be helpful in coping with various problems. Patients were guided in becoming aware of the differences between healthy and unhealthy coping techniques. Patients were asked to identify 2-3 healthy coping skills they would like to learn to use more effectively.  Therapeutic Goals Patients learned that coping is what human beings do all day long to deal with various situations in their lives Patients defined and discussed healthy vs unhealthy coping techniques Patients identified their preferred coping techniques and identified whether these were healthy or unhealthy Patients determined 2-3 healthy coping skills they would like to become more familiar with and use more often. Patients provided support and ideas to each other   Summary of Patient Progress: Patient presented for the last 15 minutes of group. Patient was not able to identify any triggering events that cause him emotional distress. However, patient did identify several coping skills that he felt would help him in the future from a list of coping skills that was provided to group members, including making a schedule, counting to 100, and meditating. Patient was receptive to discussion about how reflective thinking about past behaviors can be beneficial to help make healthier choices in the future.    Therapeutic Modalities Cognitive Behavioral Therapy Motivational Interviewing  Norberto Sorenson, Theresia Majors 06/01/2021  3:03 PM

## 2021-06-01 NOTE — Plan of Care (Signed)
Data: Patient is appropriate and cooperative to assessment, mildly irritated. Patient denied SI/HI and AVH. Patient has completed daily self inventory worksheet. Patient has a pain rating of 0/10. Patient reports fair sleep quality, appetite is fair. Patient rates depression "0/10" , feelings of hopelessness "0/10" and anxiety "0/10" Patients goal for today is "getting a home and getting in touch with my people who work at [employer]  Action:  Q x 15 minute observation checks were completed for safety. Patient was provided with education on medications. Patient was offered support and encouragement. Patient was given scheduled medications. Patient  was encourage to attend groups, participate in unit activities and continue with plan of care.    Response: Patient is adherent with scheduled medications. Patient has no complaints at this time. Patient is receptive to treatment and safety maintained on unit.    Problem: Education: Goal: Knowledge of [facility] Education information/materials will improve Outcome: Not Progressing Goal: Emotional status will improve Outcome: Not Progressing Goal: Mental status will improve Outcome: Not Progressing Goal: Verbalization of understanding the information provided will improve Outcome: Not Progressing

## 2021-06-02 MED ORDER — DONEPEZIL HCL 5 MG PO TABS: 5.0000 mg | ORAL_TABLET | Freq: Every day | ORAL | Status: DC

## 2021-06-02 MED ORDER — PALIPERIDONE ER 3 MG PO TB24
3.0000 mg | ORAL_TABLET | Freq: Every day | ORAL | Status: DC
  Administered 2021-06-02 – 2021-06-03 (×2): 3 mg via ORAL
  Filled 2021-06-02 (×2): qty 1

## 2021-06-02 NOTE — Progress Notes (Signed)
Patient alert and oriented. Patient isolative to room. Patient compliant with medication administration. Patient currently denies SI/HI/AVH.  Q15 minute safety checks maintained.

## 2021-06-02 NOTE — Progress Notes (Signed)
[facility] MD Progress Note  06/02/2021 8:27 AM Justin Weeks  MRN:  [id number] Subjective:   Patient has generally been calm and co-operative on the floor and is compliant with his medicines. He had some confusion yesterday and was provided support by staff for it. Today, he reports feeling alright, has slept well last night and is presenting in a calm manner. He claims that there are town strangers living in his apartment, that he believes are real and he did not let them. He gets quite upset when talking about them . He is denying seeing them here and is denying current SI?HI or any AVH. He was on Invega tablet and Aricept also at home and is willing to restart these meds here.  Principal Problem: <principal problem not specified> Diagnosis: Active Problems:   Schizoaffective disorder, bipolar type (HCC)  Total Time spent with patient: 20 minutes  Past Psychiatric History:  Chronic mental health problems with several prior hospitalizations.  Has ACT team follow-up.  No known history of suicidal behavior.  When psychotic often will get grumpy and raises voice but does not have a history of significant violence.  Does not abuse substances typically  Past Medical History:  Past Medical History:  Diagnosis Date   Arthritis    Asthma    Blood transfusion without reported diagnosis    Diabetes mellitus without complication (HCC)    Hypertension    Hypothyroid 07/03/2015   Personal history of tobacco use, presenting hazards to health 05/31/2015   Schizoaffective disorder (HCC)    Seizures (HCC)    History reviewed. No pertinent surgical history. Family History: History reviewed. No pertinent family history. Family Psychiatric  History:  Denies Social History:  Social History   Substance and Sexual Activity  Alcohol Use No     Social History   Substance and Sexual Activity  Drug Use Yes   Types: Cocaine    Social History   Socioeconomic History   Marital status: Divorced    Spouse  name: Not on file   Number of children: Not on file   Years of education: Not on file   Highest education level: Not on file  Occupational History   Not on file  Tobacco Use   Smoking status: Every Day    Packs/day: 1.00    Years: 40.00    Pack years: 40.00    Types: Cigarettes   Smokeless tobacco: Never  Vaping Use   Vaping Use: Unknown  Substance and Sexual Activity   Alcohol use: No   Drug use: Yes    Types: Cocaine   Sexual activity: Yes  Other Topics Concern   Not on file  Social History Narrative   Not on file   Social Determinants of Health   Financial Resource Strain: Not on file  Food Insecurity: Not on file  Transportation Needs: Not on file  Physical Activity: Not on file  Stress: Not on file  Social Connections: Not on file   Additional Social History:                         Sleep: Good  Appetite:  Fair  Current Medications: Current Facility-Administered Medications  Medication Dose Route Frequency Provider Last Rate Last Admin   acetaminophen (TYLENOL) tablet 650 mg  650 mg Oral Q6H PRN Clapacs, John T, MD       albuterol (PROVENTIL) (2.5 MG/3ML) 0.083% nebulizer solution 3 mL  3 mL Nebulization Q6H PRN Clapacs, John  T, MD       alum & mag hydroxide-simeth (MAALOX/MYLANTA) 200-200-20 MG/5ML suspension 30 mL  30 mL Oral Q4H PRN Clapacs, Jackquline Denmark, MD       buPROPion [facility]) tablet 100 mg  100 mg Oral q AM Clapacs, Jackquline Denmark, MD   100 mg at 06/02/21 0641   divalproex (DEPAKOTE ER) 24 hr tablet 500 mg  500 mg Oral q morning Clapacs, Jackquline Denmark, MD   500 mg at 06/01/21 2993   divalproex (DEPAKOTE ER) 24 hr tablet 750 mg  750 mg Oral QHS Clapacs, John T, MD   750 mg at 06/01/21 2005   donepezil (ARICEPT) tablet 10 mg  10 mg Oral Daily Clapacs, Jackquline Denmark, MD   10 mg at 06/01/21 7169   glipiZIDE (GLUCOTROL XL) 24 hr tablet 5 mg  5 mg Oral Q breakfast Clapacs, Jackquline Denmark, MD   5 mg at 06/01/21 6789   hydrOXYzine (ATARAX/VISTARIL) tablet 50 mg  50 mg Oral TID  PRN Clapacs, Jackquline Denmark, MD   50 mg at 06/01/21 2006   levothyroxine (SYNTHROID) tablet 50 mcg  50 mcg Oral Q0600 Clapacs, Jackquline Denmark, MD   50 mcg at 06/02/21 0622   linagliptin (TRADJENTA) tablet 5 mg  5 mg Oral Daily Clapacs, Jackquline Denmark, MD   5 mg at 06/01/21 3810   magnesium hydroxide (MILK OF MAGNESIA) suspension 30 mL  30 mL Oral Daily PRN Clapacs, Jackquline Denmark, MD       metFORMIN (GLUCOPHAGE-XR) 24 hr tablet 1,000 mg  1,000 mg Oral BID WC Clapacs, John T, MD   1,000 mg at 06/01/21 1623   simvastatin (ZOCOR) tablet 20 mg  20 mg Oral QHS Clapacs, John T, MD   20 mg at 06/01/21 2006   traZODone (DESYREL) tablet 50 mg  50 mg Oral QHS PRN Danelle Earthly, Evonte Prestage   50 mg at 06/01/21 2006    Lab Results:  Results for orders placed or performed during the hospital encounter of 05/31/21 (from the past 48 hour(s))  Lipid panel     Status: Abnormal   Collection Time: 05/31/21  3:44 PM  Result Value Ref Range   Cholesterol 128 0 - 200 mg/dL   Triglycerides 72 <175 mg/dL   HDL 34 (L) >10 mg/dL   Total CHOL/HDL Ratio 3.8 RATIO   VLDL 14 0 - 40 mg/dL   LDL Cholesterol 80 0 - 99 mg/dL    Comment:        Total Cholesterol/HDL:CHD Risk Coronary Heart Disease Risk Table                     Men   Women  1/2 Average Risk   3.4   3.3  Average Risk       5.0   4.4  2 X Average Risk   9.6   7.1  3 X Average Risk  23.4   11.0        Use the calculated Patient Ratio above and the CHD Risk Table to determine the patient's CHD Risk.        ATP III CLASSIFICATION (LDL):  <100     mg/dL   Optimal  258-527  mg/dL   Near or Above                    Optimal  130-159  mg/dL   Borderline  782-423  mg/dL   High  >536     mg/dL   Very  High Performed at [facility], [address] [place], [place] [address]   Glucose, capillary     Status: Abnormal   Collection Time: 05/31/21  4:55 PM  Result Value Ref Range   Glucose-Capillary 251 (H) 70 - 99 mg/dL    Comment: Glucose reference range applies only to samples taken  after fasting for at least 8 hours.    Blood Alcohol level:  Lab Results  Component Value Date   ETH <10 05/31/2021   ETH <10 03/07/2020    Metabolic Disorder Labs: Lab Results  Component Value Date   HGBA1C 8.0 (H) 05/31/2021   MPG 183 05/31/2021   MPG 212 02/20/2020   Lab Results  Component Value Date   PROLACTIN 21.9 (H) 10/18/2019   Lab Results  Component Value Date   CHOL 128 05/31/2021   TRIG 72 05/31/2021   HDL 34 (L) 05/31/2021   CHOLHDL 3.8 05/31/2021   VLDL 14 05/31/2021   LDLCALC 80 05/31/2021   LDLCALC 71 05/31/2021    Physical Findings: AIMS:  , ,  ,  ,    CIWA:    COWS:     Musculoskeletal: Strength & Muscle Tone: within normal limits Gait & Station: normal Patient leans: N/A  Psychiatric Specialty Exam:  Presentation  General Appearance: Appropriate for Environment; Casual; Disheveled  Eye Contact:Fair  Speech:Garbled  Speech Volume:Increased  Handedness:Right   Mood and Affect  Mood:Anxious; Irritable  Affect:Appropriate; Labile   Thought Process  Thought Processes:Disorganized  Descriptions of Associations:Loose  Orientation:Full (Time, Place and Person)  Thought Content:Paranoid Ideation  History of Schizophrenia/Schizoaffective disorder:Yes  Duration of Psychotic Symptoms:Greater than six months  Hallucinations:Hallucinations: Auditory  Ideas of Reference:Other (comment)  Suicidal Thoughts:Suicidal Thoughts: No  Homicidal Thoughts:Homicidal Thoughts: No   Sensorium  Memory:Immediate Fair  Judgment:Poor  Insight:Poor   Executive Functions  Concentration:Fair  Attention Span:Fair  Recall:Fair  Fund of Knowledge:Fair  Language:Fair   Psychomotor Activity  Psychomotor Activity:Psychomotor Activity: Restlessness   Assets  Assets:Desire for Improvement; Housing; Social Support   Sleep  Sleep:Sleep: Poor    Physical Exam: Physical Exam Constitutional:      Appearance: Normal appearance.  He is normal weight.  HENT:     Head: Normocephalic and atraumatic.     Right Ear: Tympanic membrane normal.     Left Ear: Tympanic membrane normal.     Nose: Nose normal.     Mouth/Throat:     Mouth: Mucous membranes are moist.     Pharynx: Oropharynx is clear.  Eyes:     Extraocular Movements: Extraocular movements intact.     Conjunctiva/sclera: Conjunctivae normal.     Pupils: Pupils are equal, round, and reactive to light.  Cardiovascular:     Rate and Rhythm: Normal rate and regular rhythm.     Pulses: Normal pulses.     Heart sounds: Normal heart sounds.  Pulmonary:     Effort: Pulmonary effort is normal.  Abdominal:     General: Abdomen is flat. Bowel sounds are normal.     Palpations: Abdomen is soft.  Musculoskeletal:        General: Normal range of motion.     Cervical back: Normal range of motion and neck supple.  Skin:    General: Skin is warm.  Neurological:     General: No focal deficit present.     Mental Status: He is alert.   Review of Systems  Constitutional: Negative.   HENT: Negative.    Eyes: Negative.  Respiratory: Negative.    Cardiovascular: Negative.   Gastrointestinal: Negative.   Genitourinary: Negative.   Musculoskeletal: Negative.   Skin: Negative.   Neurological: Negative.   Endo/Heme/Allergies: Negative.   Psychiatric/Behavioral:  Positive for hallucinations. The patient is nervous/anxious.   Blood pressure 125/73, pulse 79, temperature 98.4 F (36.9 C), temperature source Oral, resp. rate 19, height 5\' 8"  (1.727 m), weight 64.9 kg, SpO2 98 %. Body mass index is 21.74 kg/m.   Treatment Plan Summary: Daily contact with patient to assess and evaluate symptoms and progress in treatment, Medication management, and Plan : Restart Aricept and Invega at low doses.   06/02/2021, 8:27 AM

## 2021-06-02 NOTE — Group Note (Signed)
LCSW Group Therapy Note  Group Date: 06/02/2021 Start Time: 1305 End Time: 1405   Type of Therapy and Topic:  Group Therapy: Anger Cues and Responses  Participation Level:  Did Not Attend   Description of Group:   In this group, patients learned how to recognize the physical, cognitive, emotional, and behavioral responses they have to anger-provoking situations.  They identified a recent time they became angry and how they reacted.  They analyzed how their reaction was possibly beneficial and how it was possibly unhelpful.  The group discussed a variety of healthier coping skills that could help with such a situation in the future.  Focus was placed on how helpful it is to recognize the underlying emotions to our anger, because working on those can lead to a more permanent solution as well as our ability to focus on the important rather than the urgent.  Therapeutic Goals: Patients will remember their last incident of anger and how they felt emotionally and physically, what their thoughts were at the time, and how they behaved. Patients will identify how their behavior at that time worked for them, as well as how it worked against them. Patients will explore possible new behaviors to use in future anger situations. Patients will learn that anger itself is normal and cannot be eliminated, and that healthier reactions can assist with resolving conflict rather than worsening situations.  Summary of Patient Progress: Patient did not attend group despite encouraged participation.   Therapeutic Modalities:   Cognitive Behavioral Therapy    Justin Weeks, Justin Weeks 06/02/2021  3:16 PM

## 2021-06-02 NOTE — Progress Notes (Signed)
D: Pt appears as restless with a flat affect. Pt is difficult to understand due to missing teeth. Pt denies SI, HI, and AVH at this time.   A: Administered medications as per MAR orders. Encouraged pt to come to staff with any concerns.  R: Pt is calm and cooperative. Pt is medication compliant. Pt remains safe on the unit at this time.    06/02/21 2108  Psych Admission Type (Psych Patients Only)  Admission Status Involuntary  Psychosocial Assessment  Patient Complaints None  Eye Contact Fair  Facial Expression Flat  Affect Irritable  Speech Logical/coherent  Interaction Forwards little  Motor Activity Slow  Appearance/Hygiene Unremarkable;In scrubs  Behavior Characteristics Anxious  Mood Anxious  Thought Process  Coherency WDL  Content WDL  Delusions None reported or observed  Perception WDL  Hallucination None reported or observed  Judgment Limited  Confusion None  Danger to Self  Current suicidal ideation? Denies  Danger to Others  Danger to Others None reported or observed

## 2021-06-03 ENCOUNTER — Encounter: Payer: Self-pay | Admitting: Psychiatry

## 2021-06-03 DIAGNOSIS — F250 Schizoaffective disorder, bipolar type: Principal | ICD-10-CM

## 2021-06-03 LAB — HEMOGLOBIN A1C
Hgb A1c MFr Bld: 8.1 % — ABNORMAL HIGH (ref 4.8–5.6)
Mean Plasma Glucose: 186 mg/dL

## 2021-06-03 LAB — VALPROIC ACID LEVEL: Valproic Acid Lvl: 96 ug/mL (ref 50.0–100.0)

## 2021-06-03 MED ORDER — ADULT MULTIVITAMIN W/MINERALS CH
1.0000 | ORAL_TABLET | Freq: Every day | ORAL | Status: DC
  Administered 2021-06-03 – 2021-06-06 (×4): 1 via ORAL
  Filled 2021-06-03 (×4): qty 1

## 2021-06-03 MED ORDER — OLANZAPINE 10 MG PO TABS: 10.0000 mg | ORAL_TABLET | Freq: Four times a day (QID) | ORAL | Status: DC | PRN

## 2021-06-03 MED ORDER — PALIPERIDONE ER 3 MG PO TB24
12.0000 mg | ORAL_TABLET | Freq: Every day | ORAL | Status: DC
  Administered 2021-06-04 – 2021-06-06 (×3): 12 mg via ORAL
  Filled 2021-06-03 (×3): qty 4

## 2021-06-03 MED ORDER — ZIPRASIDONE MESYLATE 20 MG IM SOLR: 20.0000 mg | Freq: Four times a day (QID) | INTRAMUSCULAR | Status: DC | PRN

## 2021-06-03 MED ORDER — GLUCERNA SHAKE PO LIQD
237.0000 mL | Freq: Two times a day (BID) | ORAL | Status: DC
  Administered 2021-06-03 – 2021-06-06 (×6): 237 mL via ORAL

## 2021-06-03 NOTE — Progress Notes (Signed)
Patient alert and oriented. Patient currently denies SI/HI/AVH. Patient also denies pain. Patient isolative to room. Patient compliant during medication administration.  Q15 minute checks maintained. Patient remains safe on the unit.

## 2021-06-03 NOTE — Progress Notes (Signed)
Recreation Therapy Notes  INPATIENT RECREATION THERAPY ASSESSMENT  Patient Details Name: Justin Weeks MRN: [id number] DOB: June 20, 1961 Today's Date: 06/03/2021       Information Obtained From: Patient  Able to Participate in Assessment/Interview: Yes  Patient Presentation: Responsive, Resistant, Withdrawn  Reason for Admission (Per Patient): Active Symptoms  Patient Stressors:    Coping Skills:   Sports, Film/video editor, Prayer  Leisure Interests (2+):  Sports - Swimming, Sports - Art gallery manager, Sports - Basketball  Frequency of Recreation/Participation:    Biochemist, clinical Resources:  No  Community Resources:     Current Use:    If no, Barriers?:    Expressed Interest in [employer] Information: No  [employer] of Residence:  Film/video editor  Patient Main Form of Transportation: Therapist, music  Patient Strengths:  Alot  Patient Identified Areas of Improvement:  Moving  Patient Goal for Hospitalization:  Moving into the section 8 apartment above me  Current SI (including self-harm):  No  Current HI:  No  Current AVH: No  Staff Intervention Plan: Group Attendance, Collaborate with Interdisciplinary Treatment Team  Consent to Intern Participation: N/A  Florance Paolillo 06/03/2021, 1:53 PM

## 2021-06-03 NOTE — BH IP Treatment Plan (Signed)
Interdisciplinary Treatment and Diagnostic Plan Update  06/03/2021 Time of Session: 0900 Justin Weeks MRN: [id number]  Principal Diagnosis: <principal problem not specified>  Secondary Diagnoses: Active Problems:   Schizoaffective disorder, bipolar type (HCC)   Current Medications:  Current Facility-Administered Medications  Medication Dose Route Frequency Provider Last Rate Last Admin   acetaminophen (TYLENOL) tablet 650 mg  650 mg Oral Q6H PRN Clapacs, John T, MD       albuterol (PROVENTIL) (2.5 MG/3ML) 0.083% nebulizer solution 3 mL  3 mL Nebulization Q6H PRN Clapacs, John T, MD       alum & mag hydroxide-simeth (MAALOX/MYLANTA) 200-200-20 MG/5ML suspension 30 mL  30 mL Oral Q4H PRN Clapacs, Madie Reno, MD       buPROPion [facility]) tablet 100 mg  100 mg Oral q AM Clapacs, John T, MD   100 mg at 06/03/21 0615   divalproex (DEPAKOTE ER) 24 hr tablet 500 mg  500 mg Oral q morning Clapacs, John T, MD   500 mg at 06/02/21 1018   divalproex (DEPAKOTE ER) 24 hr tablet 750 mg  750 mg Oral QHS Clapacs, John T, MD   750 mg at 06/02/21 2108   donepezil (ARICEPT) tablet 10 mg  10 mg Oral Daily Clapacs, Madie Reno, MD   10 mg at 06/03/21 0748   glipiZIDE (GLUCOTROL XL) 24 hr tablet 5 mg  5 mg Oral Q breakfast Clapacs, John T, MD   5 mg at 06/02/21 0840   hydrOXYzine (ATARAX/VISTARIL) tablet 50 mg  50 mg Oral TID PRN Clapacs, Madie Reno, MD   50 mg at 06/02/21 2108   levothyroxine (SYNTHROID) tablet 50 mcg  50 mcg Oral Q0600 Clapacs, Madie Reno, MD   50 mcg at 06/03/21 0615   linagliptin (TRADJENTA) tablet 5 mg  5 mg Oral Daily Clapacs, Madie Reno, MD   5 mg at 06/03/21 0748   magnesium hydroxide (MILK OF MAGNESIA) suspension 30 mL  30 mL Oral Daily PRN Clapacs, Madie Reno, MD       metFORMIN (GLUCOPHAGE-XR) 24 hr tablet 1,000 mg  1,000 mg Oral BID WC Clapacs, John T, MD   1,000 mg at 06/03/21 0748   paliperidone (INVEGA) 24 hr tablet 3 mg  3 mg Oral Daily Malik, Kashif   3 mg at 06/03/21 0748   simvastatin (ZOCOR)  tablet 20 mg  20 mg Oral QHS Clapacs, John T, MD   20 mg at 06/02/21 2110   traZODone (DESYREL) tablet 50 mg  50 mg Oral QHS PRN Wynell Balloon, Kashif   50 mg at 06/02/21 2108   PTA Medications: Medications Prior to Admission  Medication Sig Dispense Refill Last Dose   albuterol (VENTOLIN HFA) 108 (90 Base) MCG/ACT inhaler Inhale 2 puffs into the lungs every 6 (six) hours as needed for wheezing or shortness of breath. (Patient not taking: No sig reported) 8 g 1    buPROPion (WELLBUTRIN) 100 MG tablet Take 100 mg by mouth in the morning.      clonazePAM (KLONOPIN) 0.5 MG tablet Take 1 tablet (0.5 mg total) by mouth 2 (two) times daily. (Patient not taking: No sig reported) 60 tablet 1    divalproex (DEPAKOTE ER) 250 MG 24 hr tablet Take 3 tablets (750 mg total) by mouth at bedtime. 90 tablet 1    divalproex (DEPAKOTE ER) 500 MG 24 hr tablet Take 1 tablet (500 mg total) by mouth every morning. 30 tablet 1    donepezil (ARICEPT) 10 MG tablet Take 1 tablet (  10 mg total) by mouth daily. 30 tablet 1    glipiZIDE (GLUCOTROL XL) 10 MG 24 hr tablet Take 1 tablet (10 mg total) by mouth daily with breakfast. 30 tablet 1    levothyroxine (SYNTHROID) 50 MCG tablet Take 1 tablet (50 mcg total) by mouth daily at 6 (six) AM. 30 tablet 1    metformin (FORTAMET) 1000 MG (OSM) 24 hr tablet Take 1 tablet (1,000 mg total) by mouth 2 (two) times daily with a meal. 60 tablet 1    paliperidone (INVEGA SUSTENNA) 156 MG/ML SUSY injection Inject 1 mL (156 mg total) into the muscle every 28 (twenty-eight) days. 1.2 mL 1    paliperidone (INVEGA) 9 MG 24 hr tablet Take 1 tablet (9 mg total) by mouth daily. 30 tablet 1    simvastatin (ZOCOR) 20 MG tablet Take 1 tablet (20 mg total) by mouth at bedtime. 30 tablet 1    sitaGLIPtin (JANUVIA) 100 MG tablet Take 1 tablet (100 mg total) by mouth daily. 30 tablet 1    temazepam (RESTORIL) 15 MG capsule Take 1 capsule (15 mg total) by mouth at bedtime as needed for sleep. (Patient not taking:  No sig reported) 30 capsule 1     Patient Stressors: Legal issue   Medication change or noncompliance    Patient Strengths: Armed forces logistics/support/administrative officer  Supportive family/friends   Treatment Modalities: Medication Management, Group therapy, Case management,  1 to 1 session with clinician, Psychoeducation, Recreational therapy.   Physician Treatment Plan for Primary Diagnosis: <principal problem not specified> Long Term Goal(s): Improvement in symptoms so as ready for discharge   Short Term Goals: Ability to identify changes in lifestyle to reduce recurrence of condition will improve Ability to verbalize feelings will improve Ability to identify and develop effective coping behaviors will improve Ability to maintain clinical measurements within normal limits will improve Ability to demonstrate self-control will improve Compliance with prescribed medications will improve  Medication Management: Evaluate patient's response, side effects, and tolerance of medication regimen.  Therapeutic Interventions: 1 to 1 sessions, Unit Group sessions and Medication administration.  Evaluation of Outcomes: Not Met  Physician Treatment Plan for Secondary Diagnosis: Active Problems:   Schizoaffective disorder, bipolar type ([place])  Long Term Goal(s): Improvement in symptoms so as ready for discharge   Short Term Goals: Ability to identify changes in lifestyle to reduce recurrence of condition will improve Ability to verbalize feelings will improve Ability to identify and develop effective coping behaviors will improve Ability to maintain clinical measurements within normal limits will improve Ability to demonstrate self-control will improve Compliance with prescribed medications will improve     Medication Management: Evaluate patient's response, side effects, and tolerance of medication regimen.  Therapeutic Interventions: 1 to 1 sessions, Unit Group sessions and Medication  administration.  Evaluation of Outcomes: Not Met   RN Treatment Plan for Primary Diagnosis: <principal problem not specified> Long Term Goal(s): Knowledge of disease and therapeutic regimen to maintain health will improve  Short Term Goals: Ability to remain free from injury will improve, Ability to verbalize frustration and anger appropriately will improve, Ability to demonstrate self-control, Ability to participate in decision making will improve, Ability to verbalize feelings will improve, Ability to disclose and discuss suicidal ideas, Ability to identify and develop effective coping behaviors will improve, and Compliance with prescribed medications will improve  Medication Management: RN will administer medications as ordered by provider, will assess and evaluate patient's response and provide education to patient for prescribed medication. RN will report  any adverse and/or side effects to prescribing provider.  Therapeutic Interventions: 1 on 1 counseling sessions, Psychoeducation, Medication administration, Evaluate responses to treatment, Monitor vital signs and CBGs as ordered, Perform/monitor CIWA, COWS, AIMS and Fall Risk screenings as ordered, Perform wound care treatments as ordered.  Evaluation of Outcomes: Not Met   LCSW Treatment Plan for Primary Diagnosis: <principal problem not specified> Long Term Goal(s): Safe transition to appropriate next level of care at discharge, Engage patient in therapeutic group addressing interpersonal concerns.  Short Term Goals: Engage patient in aftercare planning with referrals and resources, Increase social support, Increase ability to appropriately verbalize feelings, Increase emotional regulation, Facilitate acceptance of mental health diagnosis and concerns, Facilitate patient progression through stages of change regarding substance use diagnoses and concerns, Identify triggers associated with mental health/substance abuse issues, and Increase  skills for wellness and recovery  Therapeutic Interventions: Assess for all discharge needs, 1 to 1 time with Social worker, Explore available resources and support systems, Assess for adequacy in community support network, Educate family and significant other(s) on suicide prevention, Complete Psychosocial Assessment, Interpersonal group therapy.  Evaluation of Outcomes: Not Met   Progress in Treatment: Attending groups: No. Participating in groups: No. Taking medication as prescribed: Yes. Toleration medication: Yes. Family/Significant other contact made: Yes, individual(s) contacted:  SPE completed with patient, declined consent to reach collateral.  Patient understands diagnosis: No. Discussing patient identified problems/goals with staff: Yes. Medical problems stabilized or resolved: Yes. Denies suicidal/homicidal ideation: Yes. Issues/concerns per patient self-inventory: Yes. Other: none   New problem(s) identified: Yes, Describe:  Patient reports he needs a new section 8 apartment, would like to move upstairs in the apartment complex where he believes there is vacancy. States the apartment he lives in has bed bugs.    New Short Term/Long Term Goal(s): Patient to work toward elimination of symptoms of psychosis, medication management for mood stabilization; elimination of SI thoughts; development of comprehensive mental wellness plan.  Patient Goals:  "want to get a new section 8 apartment"   Discharge Plan or Barriers: None identified at this time.   Reason for Continuation of Hospitalization: Delusions  Other; describe Psychotic Features, Paranoia   Estimated Length of Stay: 1-7 days   Scribe for Treatment Team: Larose Kells 06/03/2021 9:52 AM

## 2021-06-03 NOTE — Progress Notes (Addendum)
[facility] MD Progress Note  06/03/2021 12:23 PM Justin Weeks  MRN:  [id number]  CC "Want to move up to a different apartment room."  Subjective:  60 year old man with chronic mental health problems who called 911 for voluntary inpatient admission. No acute events overnight, medication compliant, attending to ADLs. Patient seen during treatment team and again one-on-one. Today he states his goals are to move into a new apartment within the same complex because his has bed bugs. No discussion of ghosts or frankly paranoid content from admission. He is irritable and quickly angers without any provocation when trying to clarify his apartment name.  Appears somewhat guarded, but does give [place] permission to speak to his ACT Team.   Principal Problem: Schizoaffective disorder, bipolar type (HCC) Diagnosis: Principal Problem:   Schizoaffective disorder, bipolar type (HCC) Active Problems:   Noncompliance   Hypothyroidism   HTN (hypertension)   Dyslipidemia   Tobacco use disorder   Chronic kidney disease, stage II (mild)   Memory difficulty   Type 2 diabetes mellitus without complications (HCC)  Total Time spent with patient: 30 minutes  Past Psychiatric History: See H&P  Past Medical History:  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   Diabetes mellitus without complication (HCC)    Hypertension    Hypothyroid 07/03/2015   Personal history of tobacco use, presenting hazards to health 05/31/2015   Schizoaffective disorder (HCC)    Seizures (HCC)    History reviewed. No pertinent surgical history. Family History: History reviewed. No pertinent family history. Family Psychiatric  History: See H&P Social History:  Social History   Substance and Sexual Activity  Alcohol Use No     Social History   Substance and Sexual Activity  Drug Use Yes   Types: Cocaine     Social History   Socioeconomic History   Marital status: Divorced    Spouse name: Not on file   Number of children: Not on file   Years of education: Not on file   Highest education level: Not on file  Occupational History   Not on file  Tobacco Use   Smoking status: Every Day    Packs/day: 1.00    Years: 40.00    Pack years: 40.00    Types: Cigarettes   Smokeless tobacco: Never  Vaping Use   Vaping Use: Unknown  Substance and Sexual Activity   Alcohol use: No   Drug use: Yes    Types: Cocaine   Sexual activity: Yes  Other Topics Concern   Not on file  Social History Narrative   Not on file   Social Determinants of Health   Financial Resource Strain: Not on file  Food Insecurity: Not on file  Transportation Needs: Not on file  Physical Activity: Not on file  Stress: Not on file  Social Connections: Not on file   Additional Social History:                         Sleep: Fair  Appetite:  Fair  Current Medications: Current Facility-Administered Medications  Medication Dose Route Frequency Provider Last Rate Last Admin   acetaminophen (TYLENOL) tablet 650 mg  650 mg Oral Q6H PRN Clapacs, John T, MD       albuterol (PROVENTIL) (2.5 MG/3ML) 0.083% nebulizer solution 3 mL  3 mL Nebulization Q6H  PRN Clapacs, Jackquline Denmark, MD       alum & mag hydroxide-simeth (MAALOX/MYLANTA) 200-200-20 MG/5ML suspension 30 mL  30 mL Oral Q4H PRN Clapacs, Jackquline Denmark, MD       buPROPion [facility]) tablet 100 mg  100 mg Oral q AM Clapacs, John T, MD   100 mg at 06/03/21 0615   divalproex (DEPAKOTE ER) 24 hr tablet 500 mg  500 mg Oral q morning Clapacs, John T, MD   500 mg at 06/03/21 1029   divalproex (DEPAKOTE ER) 24 hr tablet 750 mg  750 mg Oral QHS Clapacs, John T, MD   750 mg at 06/02/21 2108   donepezil (ARICEPT) tablet 10 mg  10 mg Oral Daily Clapacs, John T, MD   10 mg at 06/03/21 0748   feeding supplement (GLUCERNA SHAKE) (GLUCERNA SHAKE) liquid 237 mL  237 mL Oral BID BM  Clapacs, John T, MD       glipiZIDE (GLUCOTROL XL) 24 hr tablet 5 mg  5 mg Oral Q breakfast Clapacs, John T, MD   5 mg at 06/03/21 1029   hydrOXYzine (ATARAX/VISTARIL) tablet 50 mg  50 mg Oral TID PRN Clapacs, Jackquline Denmark, MD   50 mg at 06/02/21 2108   levothyroxine (SYNTHROID) tablet 50 mcg  50 mcg Oral Q0600 Clapacs, John T, MD   50 mcg at 06/03/21 0615   linagliptin (TRADJENTA) tablet 5 mg  5 mg Oral Daily Clapacs, John T, MD   5 mg at 06/03/21 0748   magnesium hydroxide (MILK OF MAGNESIA) suspension 30 mL  30 mL Oral Daily PRN Clapacs, John T, MD       metFORMIN (GLUCOPHAGE-XR) 24 hr tablet 1,000 mg  1,000 mg Oral BID WC Clapacs, John T, MD   1,000 mg at 06/03/21 7902   multivitamin with minerals tablet 1 tablet  1 tablet Oral Daily Clapacs, John T, MD   1 tablet at 06/03/21 1146   paliperidone (INVEGA) 24 hr tablet 3 mg  3 mg Oral Daily Malik, Kashif   3 mg at 06/03/21 0748   simvastatin (ZOCOR) tablet 20 mg  20 mg Oral QHS Clapacs, John T, MD   20 mg at 06/02/21 2110   traZODone (DESYREL) tablet 50 mg  50 mg Oral QHS PRN Danelle Earthly, Kashif   50 mg at 06/02/21 2108    Lab Results: No results found for this or any previous visit (from the past 48 hour(s)).  Blood Alcohol level:  Lab Results  Component Value Date   ETH <10 05/31/2021   ETH <10 03/07/2020    Metabolic Disorder Labs: Lab Results  Component Value Date   HGBA1C 8.1 (H) 05/31/2021   MPG 186 05/31/2021   MPG 183 05/31/2021   Lab Results  Component Value Date   PROLACTIN 21.9 (H) 10/18/2019   Lab Results  Component Value Date   CHOL 128 05/31/2021   TRIG 72 05/31/2021   HDL 34 (L) 05/31/2021   CHOLHDL 3.8 05/31/2021   VLDL 14 05/31/2021   LDLCALC 80 05/31/2021   LDLCALC 71 05/31/2021    Physical Findings: AIMS:  , ,  ,  ,    CIWA:    COWS:     Musculoskeletal: Strength & Muscle Tone: within normal limits Gait & Station: normal Patient leans: N/A  Psychiatric Specialty Exam:  Presentation  General  Appearance: Appropriate for Environment; Casual; Disheveled  Eye Contact:Fair  Speech:Garbled  Speech Volume:Increased  Handedness:Right   Mood and Affect  Mood:Anxious; Irritable  Affect:Appropriate; Labile   Thought Process  Thought Processes:Disorganized  Descriptions of Associations:Loose  Orientation:Full (Time, Place and Person)  Thought Content:Paranoid Ideation  History of Schizophrenia/Schizoaffective disorder:Yes  Duration of Psychotic Symptoms:Greater than six months  Hallucinations:No data recorded Ideas of Reference:Other (comment)  Suicidal Thoughts:No data recorded Homicidal Thoughts:No data recorded  Sensorium  Memory:Immediate Fair  Judgment:Poor  Insight:Poor   Executive Functions  Concentration:Fair  Attention Span:Fair  Recall:Fair  Fund of Knowledge:Fair  Language:Fair   Psychomotor Activity  Psychomotor Activity: No data recorded  Assets  Assets:Desire for Improvement; Housing; Social Support   Sleep  Sleep: No data recorded   Physical Exam: Physical Exam ROS Blood pressure 114/89, pulse (!) 103, temperature 98 F (36.7 C), temperature source Oral, resp. rate 17, height 5\' 8"  (1.727 m), weight 64.9 kg, SpO2 99 %. Body mass index is 21.74 kg/m.   Treatment Plan Summary: Daily contact with patient to assess and evaluate symptoms and progress in treatment and Medication management  Schizoaffective disorder, bipolar type - Continue Depakote 500 mg in the morning, 750 mg in the evening, will check VPA tonight  - Resume home dose of  Invega 12 mg QHS, Invega Sustenna 234 mg IM injection given 05/28/21 (confirmed via ACT Team)  - Wellbutrin 100 mg QAM  Diabetes Mellitus Type 2 - Metformin 1000 mg BID, glipizide 5 mg daily, tradjent 5 mg daily, carb modified diet  HLD - Simvastin 20 mg QHS  Hypothyroidism - Synthroid 50 mcg daily   Memory Deficits - Continue Aricept 10 mg daily    05/30/21,  MD 06/03/2021, 12:23 PM

## 2021-06-03 NOTE — [facility] Counselor (Signed)
CSW contacted [place] w/ [facility] by written consent of patient for continuity of care. CSW was provided medication list which was given to physician for review. Liborio Nixon intends to meet w/ pt on Wednesday 5 Oct @ 1100 to discuss housing options. Patient had made prior statements saying that there are bed bugs in his apartment which Liborio Nixon confirms. She states the patient's behaviors are likely due to issues with housing. Patient had been stable on medications for sustained period of time until housing became an issue.   Signed:  Corky Crafts, MSW, LCSWA, LCASA 06/03/2021 1:10 PM

## 2021-06-03 NOTE — Group Note (Signed)
[facility] LCSW Group Therapy Note    Group Date: 06/03/2021 Start Time: 1315 End Time: 1400  Type of Therapy and Topic:  Group Therapy:  Overcoming Obstacles  Participation Level:  [facility] PARTICIPATION LEVEL: Did Not Attend  Description of Group:   In this group patients will be encouraged to explore what they see as obstacles to their own wellness and recovery. They will be guided to discuss their thoughts, feelings, and behaviors related to these obstacles. The group will process together ways to cope with barriers, with attention given to specific choices patients can make. Each patient will be challenged to identify changes they are motivated to make in order to overcome their obstacles. This group will be process-oriented, with patients participating in exploration of their own experiences as well as giving and receiving support and challenge from other group members.  Therapeutic Goals: 1. Patient will identify personal and current obstacles as they relate to admission. 2. Patient will identify barriers that currently interfere with their wellness or overcoming obstacles.  3. Patient will identify feelings, thought process and behaviors related to these barriers. 4. Patient will identify two changes they are willing to make to overcome these obstacles:    Summary of Patient Progress  X   Therapeutic Modalities:   Cognitive Behavioral Therapy Solution Focused Therapy Motivational Interviewing Relapse Prevention Therapy   Loring Liskey A Corbin Falck, LCSWA 

## 2021-06-03 NOTE — Progress Notes (Signed)
Initial Nutrition Assessment  DOCUMENTATION CODES:  Not applicable  INTERVENTION:  Continue carb modified diet, encourage PO intake Request soft foods be sent in place of hard/crunch for poor dentition MVI with minerals daily Glucerna Shake po BID, each supplement provides 220 kcal and 10 grams of protein  NUTRITION DIAGNOSIS:  Inadequate oral intake related to social / environmental circumstances (lack of access to food) as evidenced by per patient/family report.  GOAL:  Patient will meet greater than or equal to 90% of their needs  MONITOR:  PO intake, Supplement acceptance, Weight trends  REASON FOR ASSESSMENT:  Malnutrition Screening Tool    ASSESSMENT:  61 y.o. male with PMH of schizoaffective disorder, DM type 2 and HTN presented to ED with delusional thoughts and hearing voices. States that he has not been sleeping well because his apartment is haunted and poor intake due to having no money. Reports he has not been taking his medications.   Pt remains in [facility] unit at this time for stabilization. Pt reports weight loss on admission screen but no recent weight loss is noted in hx. Appears to be relatively stable over the last year with some weight loss seen in the past. (~3 years ago). Pt reported poor oral intake due to lack of acces PTA. Weight WNL for age. Will encourage oral intake, add MVI as pt is likely deficient, and nutrition supplements. Poor dentition noted by RN, will also request dining services send soft foods to aid in consumption.   Nutritionally Relevant Medications: Scheduled Meds:  glipiZIDE  5 mg Oral Q breakfast   levothyroxine  50 mcg Oral Q0600   linagliptin  5 mg Oral Daily   metformin  1,000 mg Oral BID WC   simvastatin  20 mg Oral QHS   PRN Meds: alum & mag hydroxide-simeth, magnesium hydroxide  Labs Reviewed: HgbA1c 8.1%  Diet Order:   Diet Order             Diet Carb Modified Fluid consistency: Thin; Room service appropriate? Yes  Diet  effective now                   EDUCATION NEEDS:  Not appropriate for education at this time  Skin:  Skin Assessment: Reviewed RN Assessment  Last BM:  9/30  Height:  Ht Readings from Last 1 Encounters:  05/31/21 5\' 8"  (1.727 m)   Weight:  Wt Readings from Last 1 Encounters:  05/31/21 64.9 kg    Ideal Body Weight:  70 kg  BMI:  Body mass index is 21.74 kg/m.  Estimated Nutritional Needs: Kcal:  1800-2000 kcal/d Protein:  90-100 g/d Fluid:  1.8-2 L/d   06/02/21, RD, LDN Clinical Dietitian Pager on Amion

## 2021-06-03 NOTE — Plan of Care (Signed)
  Problem: Education: Goal: Knowledge of [facility] Education information/materials will improve Outcome: Progressing   Problem: Health Behavior/Discharge Planning: Goal: Compliance with prescribed medication regimen will improve Outcome: Progressing

## 2021-06-03 NOTE — Progress Notes (Signed)
Patient calm during assessment denying SI/HI/AVH. Patient observed interacting appropriately with staff and peers on the unit. Patient compliant with medication administration per MD orders. Pt given education, support, and encouragement to be active in his treatment plan. Pt being monitored Q 15 minutes for safety per unit protocol. Pt remains safe on the unit.   

## 2021-06-03 NOTE — Progress Notes (Signed)
Recreation Therapy Notes  Date: 06/03/2021  Time: 10:30 am   Location: Craft room   Behavioral response: N/A   Intervention Topic: Decision Making     Discussion/Intervention: Patient did not attend group.   Clinical Observations/Feedback:  Patient did not attend group.   Leopoldo Mazzie LRT/CTRS        Thaine Garriga 06/03/2021 12:38 PM

## 2021-06-03 NOTE — [place] FL2 (Addendum)
[place] MEDICAID FL2 LEVEL OF CARE SCREENING TOOL     IDENTIFICATION  Patient Name: Justin Weeks Birthdate: 05-05-1961 Sex: male Admission Date (Current Location): 05/31/2021  [place] and [place] Number:  [address] [id number] [facility] Facility and Address:  [facility], [address] [place], [place] [address]      Provider Number: [id number]  Attending Physician Name and Address:  Jesse Sans, MD  Relative Name and Phone Number:  Drey, Shaff (Father/EC) [phone]    Current Level of Care: Hospital Recommended Level of Care: Other (Comment) (Group Home) Prior Approval Number:    Date Approved/Denied:   PASRR Number:    Discharge Plan: Other (Comment) (Group Home)    Current Diagnoses: Patient Active Problem List   Diagnosis Date Noted   Psychosis (HCC) 02/21/2020   Schizoaffective disorder (HCC) 10/17/2019   Suicidal ideation    Memory difficulty 01/03/2019   Schizoaffective disorder, bipolar type (HCC) 07/04/2015   HTN (hypertension) 07/04/2015   Dyslipidemia 07/04/2015   Tobacco use disorder 07/04/2015   Tobacco dependence syndrome 07/04/2015   Noncompliance 07/03/2015   Hypothyroidism 07/03/2015   Diabetes (HCC) 04/20/2015   Chronic kidney disease, stage II (mild) 09/12/2013   Hemorrhoids 12/03/2007   Hyperlipidemia, unspecified 11/16/2007   Type 2 diabetes mellitus without complications (HCC) 11/16/2007   Essential (primary) hypertension 11/16/2007    Orientation RESPIRATION BLADDER Height & Weight     Self, Time, Situation, Place  Normal Continent Weight: 64.9 kg Height:  5\' 8"  (172.7 cm)  BEHAVIORAL SYMPTOMS/MOOD NEUROLOGICAL BOWEL NUTRITION STATUS   (Nothing significant to report)   Continent Diet (Carb modified diet.)  AMBULATORY STATUS COMMUNICATION OF NEEDS Skin   Independent Verbally Normal                       Personal Care Assistance Level of Assistance   (Patient independent w/ ADLs.)            Functional Limitations Info  Speech     Speech Info: Adequate (Speech impaired due to loss of teeth. Pt understandable w/ effort.)    SPECIAL CARE FACTORS FREQUENCY   (Nothing to report.)                    Contractures Contractures Info: Not present    Additional Factors Info  Allergies   Allergies Info: Haldol           Current Medications (06/03/2021):  This is the current hospital active medication list Current Facility-Administered Medications  Medication Dose Route Frequency Provider Last Rate Last Admin   acetaminophen (TYLENOL) tablet 650 mg  650 mg Oral Q6H PRN Clapacs, John T, MD       albuterol (PROVENTIL) (2.5 MG/3ML) 0.083% nebulizer solution 3 mL  3 mL Nebulization Q6H PRN Clapacs, John T, MD       alum & mag hydroxide-simeth (MAALOX/MYLANTA) 200-200-20 MG/5ML suspension 30 mL  30 mL Oral Q4H PRN Clapacs, 03-10-2003, MD       buPROPion [facility]) tablet 100 mg  100 mg Oral q AM Clapacs, John T, MD   100 mg at 06/03/21 0615   divalproex (DEPAKOTE ER) 24 hr tablet 500 mg  500 mg Oral q morning Clapacs, John T, MD   500 mg at 06/03/21 1029   divalproex (DEPAKOTE ER) 24 hr tablet 750 mg  750 mg Oral QHS Clapacs, John T, MD   750 mg at 06/02/21 2108   donepezil (ARICEPT) tablet 10 mg  10 mg Oral Daily Clapacs, Jackquline Denmark, MD   10 mg at 06/03/21 0748   feeding supplement (GLUCERNA SHAKE) (GLUCERNA SHAKE) liquid 237 mL  237 mL Oral BID BM Clapacs, John T, MD       glipiZIDE (GLUCOTROL XL) 24 hr tablet 5 mg  5 mg Oral Q breakfast Clapacs, John T, MD   5 mg at 06/03/21 1029   hydrOXYzine (ATARAX/VISTARIL) tablet 50 mg  50 mg Oral TID PRN Clapacs, Jackquline Denmark, MD   50 mg at 06/02/21 2108   levothyroxine (SYNTHROID) tablet 50 mcg  50 mcg Oral Q0600 Clapacs, John T, MD   50 mcg at 06/03/21 0615   linagliptin (TRADJENTA) tablet 5 mg  5 mg Oral Daily Clapacs, John T, MD   5 mg at 06/03/21 0748   magnesium hydroxide (MILK OF MAGNESIA) suspension 30 mL  30 mL Oral Daily PRN Clapacs,  Jackquline Denmark, MD       metFORMIN (GLUCOPHAGE-XR) 24 hr tablet 1,000 mg  1,000 mg Oral BID WC Clapacs, John T, MD   1,000 mg at 06/03/21 0748   multivitamin with minerals tablet 1 tablet  1 tablet Oral Daily Clapacs, John T, MD   1 tablet at 06/03/21 1146   OLANZapine (ZYPREXA) tablet 10 mg  10 mg Oral Q6H PRN Jesse Sans, MD       [START ON 06/04/2021] paliperidone (INVEGA) 24 hr tablet 12 mg  12 mg Oral Daily Les Pou M, MD       simvastatin (ZOCOR) tablet 20 mg  20 mg Oral QHS Clapacs, John T, MD   20 mg at 06/02/21 2110   traZODone (DESYREL) tablet 50 mg  50 mg Oral QHS PRN Danelle Earthly, Kashif   50 mg at 06/02/21 2108   ziprasidone (GEODON) injection 20 mg  20 mg Intramuscular Q6H PRN Jesse Sans, MD         Discharge Medications: Please see discharge summary for a list of discharge medications.  Relevant Imaging Results:  Relevant Lab Results:    Additional Information    Corky Crafts, LCSWA

## 2021-06-04 NOTE — Progress Notes (Signed)
Pt denies depression, anxiety, SI, HI and AVH. Pt was educated on care plan and verbalizes understanding. [place] RN 

## 2021-06-04 NOTE — Progress Notes (Signed)
Patient calm during assessment denying SI/HI/AVH. Patient observed interacting appropriately with staff and peers on the unit. Patient compliant with medication administration per MD orders. Pt given education, support, and encouragement to be active in his treatment plan. Pt being monitored Q 15 minutes for safety per unit protocol. Pt remains safe on the unit.

## 2021-06-04 NOTE — Progress Notes (Signed)
[facility] MD Progress Note  06/04/2021 11:02 AM Justin Weeks  MRN:  [id number]  CC "How long will I be here?"  Subjective:  60 year old man with chronic mental health problems who called 911 for voluntary inpatient admission. No acute events overnight, medication compliant, attending to ADLs. Patient seen one-on-one today. He is much more pleasant and cooperative. He denies SI/HI/AH/VH. He denies medication side effects. He continues to desire to switch apartment rooms. CSW was able to get in touch with his ACT Team yesterday, and they will be here tomorrow at 11AM to discuss housing options.   Principal Problem: Schizoaffective disorder, bipolar type (HCC) Diagnosis: Principal Problem:   Schizoaffective disorder, bipolar type (HCC) Active Problems:   Noncompliance   Hypothyroidism   HTN (hypertension)   Dyslipidemia   Tobacco use disorder   Chronic kidney disease, stage II (mild)   Memory difficulty   Type 2 diabetes mellitus without complications (HCC)  Total Time spent with patient: 30 minutes  Past Psychiatric History: See H&P  Past Medical History:  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   Diabetes mellitus without complication (HCC)    Hypertension    Hypothyroid 07/03/2015   Personal history of tobacco use, presenting hazards to health 05/31/2015   Schizoaffective disorder (HCC)    Seizures (HCC)    History reviewed. No pertinent surgical history. Family History: History reviewed. No pertinent family history. Family Psychiatric  History: See H&P Social History:  Social History   Substance and Sexual Activity  Alcohol Use No     Social History   Substance and Sexual Activity  Drug Use Yes   Types: Cocaine    Social History   Socioeconomic History   Marital status: Divorced    Spouse name: Not on file   Number of children: Not on file    Years of education: Not on file   Highest education level: Not on file  Occupational History   Not on file  Tobacco Use   Smoking status: Every Day    Packs/day: 1.00    Years: 40.00    Pack years: 40.00    Types: Cigarettes   Smokeless tobacco: Never  Vaping Use   Vaping Use: Unknown  Substance and Sexual Activity   Alcohol use: No   Drug use: Yes    Types: Cocaine   Sexual activity: Yes  Other Topics Concern   Not on file  Social History Narrative   Not on file   Social Determinants of Health   Financial Resource Strain: Not on file  Food Insecurity: Not on file  Transportation Needs: Not on file  Physical Activity: Not on file  Stress: Not on file  Social Connections: Not on file   Additional Social History:                         Sleep: Fair  Appetite:  Fair  Current Medications: Current Facility-Administered Medications  Medication Dose Route Frequency Provider Last Rate Last Admin   acetaminophen (TYLENOL) tablet 650 mg  650 mg Oral Q6H PRN Clapacs, John T, MD       albuterol (PROVENTIL) (2.5 MG/3ML) 0.083% nebulizer solution 3 mL  3 mL Nebulization Q6H PRN Clapacs, John T, MD       alum & mag hydroxide-simeth (MAALOX/MYLANTA) 200-200-20 MG/5ML suspension 30 mL  30 mL Oral Q4H PRN Clapacs, Jackquline Denmark, MD       buPROPion [facility]) tablet 100 mg  100 mg Oral q AM Clapacs, John T, MD   100 mg at 06/04/21 0615   divalproex (DEPAKOTE ER) 24 hr tablet 500 mg  500 mg Oral q morning Clapacs, John T, MD   500 mg at 06/03/21 1029   divalproex (DEPAKOTE ER) 24 hr tablet 750 mg  750 mg Oral QHS Clapacs, John T, MD   750 mg at 06/03/21 2115   donepezil (ARICEPT) tablet 10 mg  10 mg Oral Daily Clapacs, John T, MD   10 mg at 06/04/21 0758   feeding supplement (GLUCERNA SHAKE) (GLUCERNA SHAKE) liquid 237 mL  237 mL Oral BID BM Clapacs, John T, MD   237 mL at 06/03/21 1656   glipiZIDE (GLUCOTROL XL) 24 hr tablet 5 mg  5 mg Oral Q breakfast Clapacs, John T, MD   5 mg  at 06/04/21 0758   hydrOXYzine (ATARAX/VISTARIL) tablet 50 mg  50 mg Oral TID PRN Clapacs, Jackquline Denmark, MD   50 mg at 06/02/21 2108   levothyroxine (SYNTHROID) tablet 50 mcg  50 mcg Oral Q0600 Clapacs, John T, MD   50 mcg at 06/04/21 0615   linagliptin (TRADJENTA) tablet 5 mg  5 mg Oral Daily Clapacs, John T, MD   5 mg at 06/04/21 0758   magnesium hydroxide (MILK OF MAGNESIA) suspension 30 mL  30 mL Oral Daily PRN Clapacs, Jackquline Denmark, MD       metFORMIN (GLUCOPHAGE-XR) 24 hr tablet 1,000 mg  1,000 mg Oral BID WC Clapacs, John T, MD   1,000 mg at 06/04/21 0759   multivitamin with minerals tablet 1 tablet  1 tablet Oral Daily Clapacs, Jackquline Denmark, MD   1 tablet at 06/04/21 0758   OLANZapine (ZYPREXA) tablet 10 mg  10 mg Oral Q6H PRN Jesse Sans, MD       paliperidone (INVEGA) 24 hr tablet 12 mg  12 mg Oral Daily Les Pou M, MD   12 mg at 06/04/21 0758   simvastatin (ZOCOR) tablet 20 mg  20 mg Oral QHS Clapacs, John T, MD   20 mg at 06/03/21 2115   traZODone (DESYREL) tablet 50 mg  50 mg Oral QHS PRN Danelle Earthly, Kashif   50 mg at 06/03/21 2115   ziprasidone (GEODON) injection 20 mg  20 mg Intramuscular Q6H PRN Jesse Sans, MD        Lab Results:  Results for orders placed or performed during the hospital encounter of 05/31/21 (from the past 48 hour(s))  Valproic acid level     Status: None   Collection Time: 06/03/21  5:57 PM  Result Value Ref Range   Valproic Acid Lvl 96 50.0 - 100.0 ug/mL    Comment: Performed at [facility], [address] [place], [place] [address]    Blood Alcohol level:  Lab Results  Component Value Date   [facility] <10 05/31/2021   ETH <10 03/07/2020    Metabolic Disorder Labs: Lab Results  Component Value Date   HGBA1C 8.1 (H) 05/31/2021   MPG 186 05/31/2021   MPG 183 05/31/2021   Lab Results  Component Value Date   PROLACTIN 21.9 (H) 10/18/2019   Lab Results  Component Value Date   CHOL 128 05/31/2021   TRIG 72 05/31/2021   HDL 34 (L) 05/31/2021    CHOLHDL 3.8 05/31/2021   VLDL 14 05/31/2021   LDLCALC  80 05/31/2021   LDLCALC 71 05/31/2021    Physical Findings: AIMS:  , ,  ,  ,    CIWA:    COWS:     Musculoskeletal: Strength & Muscle Tone: within normal limits Gait & Station: normal Patient leans: N/A  Psychiatric Specialty Exam:  Presentation  General Appearance: Appropriate for environment, casual Eye Contact:Good Speech:Clear, normal rate Speech Volume:Normal volume Handedness:Right   Mood and Affect  Mood:Euthymic Affect:Congruent  Thought Process  Thought Processes:Goal directed Descriptions of Associations:Loose  Orientation:Full (Time, Place and Person)  Thought Content:Paranoid Ideation  History of Schizophrenia/Schizoaffective disorder:Yes  Duration of Psychotic Symptoms:Greater than six months  Hallucinations:Denies Ideas of Reference:Other (comment)  Suicidal Thoughts:Denies Homicidal Thoughts:Denies  Sensorium  Memory:Immediate Fair  Judgment:Poor  Insight:Poor   Executive Functions  Concentration:Fair  Attention Span:Fair  Recall:Fair  Fund of Knowledge:Fair  Language:Fair   Psychomotor Activity  Psychomotor Activity: Normal  Assets  Assets:Desire for Improvement; Housing; Social Support   Sleep  Sleep: Good, 8.15 hours   Physical Exam: Physical Exam ROS Blood pressure 96/74, pulse 95, temperature 98 F (36.7 C), temperature source Oral, resp. rate 17, height 5\' 8"  (1.727 m), weight 64.9 kg, SpO2 99 %. Body mass index is 21.74 kg/m.   Treatment Plan Summary: Daily contact with patient to assess and evaluate symptoms and progress in treatment and Medication management  Schizoaffective disorder, bipolar type - Continue Depakote 500 mg in the morning, 750 mg in the evening, VPA therapeutic at 96  - Continue  home dose of  Invega 12 mg QHS, Invega Sustenna 234 mg IM injection given 05/28/21 (confirmed via ACT Team)  - Wellbutrin 100 mg QAM  Diabetes  Mellitus Type 2 - Metformin 1000 mg BID, glipizide 5 mg daily, tradjenta 5 mg daily, carb modified diet  HLD - Simvastatin 20 mg QHS  Hypothyroidism - Synthroid 50 mcg daily   Memory Deficits - Continue Aricept 10 mg daily    05/30/21, MD 06/04/2021, 11:02 AM

## 2021-06-04 NOTE — Progress Notes (Addendum)
Pt has been calm and cooperative all day. Pt did go outdoors today. He has been assertive and hopes to discharge soon.  Torrie Mayers RN

## 2021-06-04 NOTE — Group Note (Signed)
[facility] LCSW Group Therapy Note   Group Date: 06/04/2021 Start Time: 1300 End Time: 1400  Type of Therapy/Topic:  Group Therapy:  Feelings about Diagnosis  Participation Level:  Did Not Attend   Mood: n/a   Description of Group:    This group will allow patients to explore their thoughts and feelings about diagnoses they have received. Patients will be guided to explore their level of understanding and acceptance of these diagnoses. Facilitator will encourage patients to process their thoughts and feelings about the reactions of others to their diagnosis, and will guide patients in identifying ways to discuss their diagnosis with significant others in their lives. This group will be process-oriented, with patients participating in exploration of their own experiences as well as giving and receiving support and challenge from other group members.   Therapeutic Goals: 1. Patient will demonstrate understanding of diagnosis as evidence by identifying two or more symptoms of the disorder:  2. Patient will be able to express two feelings regarding the diagnosis 3. Patient will demonstrate ability to communicate their needs through discussion and/or role plays  Summary of Patient Progress: Patient did not attend group despite encouraged participation.     Therapeutic Modalities:   Cognitive Behavioral Therapy Brief Therapy Feelings Identification    Corky Crafts, [place]

## 2021-06-04 NOTE — Progress Notes (Signed)
Recreation Therapy Notes   Date: 06/04/2021  Time: 10:00 am   Location: Courtyard  Behavioral response: Appropriate  Intervention Topic: Leisure    Discussion/Intervention:  Group content today was focused on leisure. The group defined what leisure is and some positive leisure activities they participate in. Individuals identified the difference between good and bad leisure. Participants expressed how they feel after participating in the leisure of their choice. The group discussed how they go about picking a leisure activity and if others are involved in their leisure activities. The patient stated how many leisure activities they have to choose from and reasons why it is important to have leisure time. Individuals participated in the intervention "Exploration of Leisure" where they had a chance to identify new leisure activities as well as benefits of leisure. Clinical Observations/Feedback: Patient came to group and was social and appropriate while participating in the intervention with peers and staff.  Euretha Najarro LRT/CTRS         Jalene Demo 06/04/2021 1:40 PM

## 2021-06-05 NOTE — Progress Notes (Signed)
Recreation Therapy Notes  Date: 06/05/2021  Time: 10:00 am   Location: Courtyard  Behavioral response: Appropriate  Intervention Topic: Social Skills  Discussion/Intervention:  Group content on today was focused on social skills. The group defined social skills and identified ways they use social skills. Patients expressed what obstacles they face when trying to be social. Participants described the importance of social skills. The group listed ways to improve social skills and reasons to improve social skills. Individuals had an opportunity to learn new and improve social skills as well as identify their weaknesses. Clinical Observations/Feedback: Patient came to group late  and was social and appropriate while participating in the intervention with peers and staff.  Detta Mellin LRT/CTRS         Indalecio Malmstrom 06/05/2021 11:44 AM

## 2021-06-05 NOTE — Group Note (Signed)
[facility] LCSW Group Therapy Note   Group Date: 06/05/2021 Start Time: 1300 End Time: 1400   Type of Therapy/Topic:  Group Therapy:  Emotion Regulation  Participation Level:  Did Not Attend   Mood:  Description of Group:    The purpose of this group is to assist patients in learning to regulate negative emotions and experience positive emotions. Patients will be guided to discuss ways in which they have been vulnerable to their negative emotions. These vulnerabilities will be juxtaposed with experiences of positive emotions or situations, and patients challenged to use positive emotions to combat negative ones. Special emphasis will be placed on coping with negative emotions in conflict situations, and patients will process healthy conflict resolution skills.  Therapeutic Goals: Patient will identify two positive emotions or experiences to reflect on in order to balance out negative emotions:  Patient will label two or more emotions that they find the most difficult to experience:  Patient will be able to demonstrate positive conflict resolution skills through discussion or role plays:   Summary of Patient Progress:   X    Therapeutic Modalities:   Cognitive Behavioral Therapy Feelings Identification Dialectical Behavioral Therapy   Bralynn Velador J Jodelle Fausto, LCSW 

## 2021-06-05 NOTE — [facility] Counselor (Signed)
CSW met with [name] from [place] ACT team with pt.  Pt stated that the reason he went to the hospital was because he apartment had been over taken by two homeless individuals who knocked on his door every night to have a place to stay. Pt stated he could not take it so he left and thought that he could get help at the hospital.   [name] explained that she would call the police and get the individuals out of his apartment. She also stated that she had two other housing options that are pending placement approval. She said that pt can remain in his apartment until one of the housing options gets finalized.   [name] will pick up patient upon discharge and meet with pt for follow up appointment.   Jennife Zaucha [place], MSW, LCSW-A 10/5/202212:44 PM

## 2021-06-05 NOTE — Progress Notes (Signed)
[facility] MD Progress Note  06/05/2021 10:17 AM TAEQUAN STOCKHAUSEN  MRN:  [id number]  CC "When do I get to leave?"  Subjective:  60 year old man with chronic mental health problems who called 911 for voluntary inpatient admission. No acute events overnight, medication compliant, attending to ADLs. Patient seen one-on-one today. He is pleasant and cooperative. He denies SI/HI/AH/VH, and is focused on discharge. Patient informed that Liborio Nixon from his ACT Team is coming today to discuss housing options with him, and he is very excited to hear this. No other questions or concerns.   Principal Problem: Schizoaffective disorder, bipolar type (HCC) Diagnosis: Principal Problem:   Schizoaffective disorder, bipolar type (HCC) Active Problems:   Noncompliance   Hypothyroidism   HTN (hypertension)   Dyslipidemia   Tobacco use disorder   Chronic kidney disease, stage II (mild)   Memory difficulty   Type 2 diabetes mellitus without complications (HCC)  Total Time spent with patient: 20 minutes  Past Psychiatric History: See H&P  Past Medical History:  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   Diabetes mellitus without complication (HCC)    Hypertension    Hypothyroid 07/03/2015   Personal history of tobacco use, presenting hazards to health 05/31/2015   Schizoaffective disorder (HCC)    Seizures (HCC)    History reviewed. No pertinent surgical history. Family History: History reviewed. No pertinent family history. Family Psychiatric  History: See H&P Social History:  Social History   Substance and Sexual Activity  Alcohol Use No     Social History   Substance and Sexual Activity  Drug Use Yes   Types: Cocaine    Social History   Socioeconomic History   Marital status: Divorced    Spouse name: Not on file   Number of children: Not on file   Years of education: Not  on file   Highest education level: Not on file  Occupational History   Not on file  Tobacco Use   Smoking status: Every Day    Packs/day: 1.00    Years: 40.00    Pack years: 40.00    Types: Cigarettes   Smokeless tobacco: Never  Vaping Use   Vaping Use: Unknown  Substance and Sexual Activity   Alcohol use: No   Drug use: Yes    Types: Cocaine   Sexual activity: Yes  Other Topics Concern   Not on file  Social History Narrative   Not on file   Social Determinants of Health   Financial Resource Strain: Not on file  Food Insecurity: Not on file  Transportation Needs: Not on file  Physical Activity: Not on file  Stress: Not on file  Social Connections: Not on file   Additional Social History:                         Sleep: Fair  Appetite:  Fair  Current Medications: Current Facility-Administered Medications  Medication Dose Route Frequency Provider Last Rate Last Admin   acetaminophen (TYLENOL) tablet 650 mg  650 mg Oral Q6H PRN Clapacs, John T, MD       albuterol (PROVENTIL) (2.5 MG/3ML) 0.083% nebulizer solution 3 mL  3 mL Nebulization Q6H PRN Clapacs, John T, MD       alum & mag hydroxide-simeth (MAALOX/MYLANTA) 200-200-20 MG/5ML suspension 30 mL  30 mL Oral Q4H  PRN Clapacs, Jackquline Denmark, MD       buPROPion [facility]) tablet 100 mg  100 mg Oral q AM Clapacs, John T, MD   100 mg at 06/05/21 1324   divalproex (DEPAKOTE ER) 24 hr tablet 500 mg  500 mg Oral q morning Clapacs, John T, MD   500 mg at 06/04/21 1155   divalproex (DEPAKOTE ER) 24 hr tablet 750 mg  750 mg Oral QHS Clapacs, John T, MD   750 mg at 06/04/21 2112   donepezil (ARICEPT) tablet 10 mg  10 mg Oral Daily Clapacs, John T, MD   10 mg at 06/05/21 0714   feeding supplement (GLUCERNA SHAKE) (GLUCERNA SHAKE) liquid 237 mL  237 mL Oral BID BM Clapacs, John T, MD   237 mL at 06/05/21 0926   glipiZIDE (GLUCOTROL XL) 24 hr tablet 5 mg  5 mg Oral Q breakfast Clapacs, John T, MD   5 mg at 06/05/21 4010    hydrOXYzine (ATARAX/VISTARIL) tablet 50 mg  50 mg Oral TID PRN Clapacs, Jackquline Denmark, MD   50 mg at 06/02/21 2108   levothyroxine (SYNTHROID) tablet 50 mcg  50 mcg Oral Q0600 Clapacs, John T, MD   50 mcg at 06/05/21 2725   linagliptin (TRADJENTA) tablet 5 mg  5 mg Oral Daily Clapacs, John T, MD   5 mg at 06/05/21 3664   magnesium hydroxide (MILK OF MAGNESIA) suspension 30 mL  30 mL Oral Daily PRN Clapacs, Jackquline Denmark, MD       metFORMIN (GLUCOPHAGE-XR) 24 hr tablet 1,000 mg  1,000 mg Oral BID WC Clapacs, John T, MD   1,000 mg at 06/05/21 4034   multivitamin with minerals tablet 1 tablet  1 tablet Oral Daily Clapacs, Jackquline Denmark, MD   1 tablet at 06/05/21 0714   OLANZapine (ZYPREXA) tablet 10 mg  10 mg Oral Q6H PRN Jesse Sans, MD       paliperidone (INVEGA) 24 hr tablet 12 mg  12 mg Oral Daily Les Pou M, MD   12 mg at 06/05/21 7425   simvastatin (ZOCOR) tablet 20 mg  20 mg Oral QHS Clapacs, John T, MD   20 mg at 06/04/21 2111   traZODone (DESYREL) tablet 50 mg  50 mg Oral QHS PRN Danelle Earthly, Kashif   50 mg at 06/04/21 2112   ziprasidone (GEODON) injection 20 mg  20 mg Intramuscular Q6H PRN Jesse Sans, MD        Lab Results:  Results for orders placed or performed during the hospital encounter of 05/31/21 (from the past 48 hour(s))  Valproic acid level     Status: None   Collection Time: 06/03/21  5:57 PM  Result Value Ref Range   Valproic Acid Lvl 96 50.0 - 100.0 ug/mL    Comment: Performed at [facility], [address] [place], [place] [address]    Blood Alcohol level:  Lab Results  Component Value Date   [facility] <10 05/31/2021   ETH <10 03/07/2020    Metabolic Disorder Labs: Lab Results  Component Value Date   HGBA1C 8.1 (H) 05/31/2021   MPG 186 05/31/2021   MPG 183 05/31/2021   Lab Results  Component Value Date   PROLACTIN 21.9 (H) 10/18/2019   Lab Results  Component Value Date   CHOL 128 05/31/2021   TRIG 72 05/31/2021   HDL 34 (L) 05/31/2021   CHOLHDL 3.8  05/31/2021   VLDL 14 05/31/2021   LDLCALC 80 05/31/2021  LDLCALC 71 05/31/2021    Physical Findings: AIMS:  , ,  ,  ,    CIWA:    COWS:     Musculoskeletal: Strength & Muscle Tone: within normal limits Gait & Station: normal Patient leans: N/A  Psychiatric Specialty Exam:  Presentation  General Appearance: Appropriate for environment, casual Eye Contact:Good Speech:Clear, normal rate Speech Volume:Normal volume Handedness:Right   Mood and Affect  Mood:Euthymic Affect:Congruent  Thought Process  Thought Processes:Goal directed Descriptions of Associations:Loose  Orientation:Full (Time, Place and Person)  Thought Content:Paranoid Ideation  History of Schizophrenia/Schizoaffective disorder:Yes  Duration of Psychotic Symptoms:Greater than six months  Hallucinations:Denies Ideas of Reference:Other (comment)  Suicidal Thoughts:Denies Homicidal Thoughts:Denies  Sensorium  Memory:Immediate Fair  Judgment:Intact Insight:Shallow  Executive Functions  Concentration:Fair  Attention Span:Fair  Recall:Fair  Fund of Knowledge:Fair  Language:Fair   Psychomotor Activity  Psychomotor Activity: Normal  Assets  Assets:Desire for Improvement; Housing; Social Support   Sleep  Sleep: Good, 8 hours   Physical Exam: Physical Exam ROS Blood pressure 115/65, pulse (!) 118, temperature 98 F (36.7 C), temperature source Oral, resp. rate 17, height 5\' 8"  (1.727 m), weight 64.9 kg, SpO2 98 %. Body mass index is 21.74 kg/m.   Treatment Plan Summary: Daily contact with patient to assess and evaluate symptoms and progress in treatment and Medication management   06/05/21: 60 year old male who presented for worsening psychosis. Showing improvement in the hospital, and he is currently looking for discharge planning. ACT Team to come today to speak to patient about options. No changes to medications below.   Schizoaffective disorder, bipolar type - Continue  Depakote 500 mg in the morning, 750 mg in the evening, VPA therapeutic at 96  - Continue  home dose of  Invega 12 mg QHS, Invega Sustenna 234 mg IM injection given 05/28/21 (confirmed via ACT Team)  - Wellbutrin 100 mg QAM  Diabetes Mellitus Type 2 - Metformin 1000 mg BID, glipizide 5 mg daily, tradjenta 5 mg daily, carb modified diet  HLD - Simvastatin 20 mg QHS  Hypothyroidism - Synthroid 50 mcg daily   Memory Deficits - Continue Aricept 10 mg daily    05/30/21, MD 06/05/2021, 10:17 AM

## 2021-06-05 NOTE — Progress Notes (Signed)
D: Pt is pleasant and is observed eating snack in the dayroom. Pt denies SI, HI, and AVH at this time. Pt voiced no complaints to this Clinical research associate. Pt is observed interacting appropriately on the unit.   A: Administered medications as per MAR orders. Pt encouraged to come to staff with any concerns.  R: Pt is medication compliant. Pt is calm and cooperative. Pt remains safe on the unit at this time.    06/05/21 2107  Psych Admission Type (Psych Patients Only)  Admission Status Involuntary  Psychosocial Assessment  Patient Complaints None  Eye Contact Fair  Facial Expression Flat  Affect Appropriate to circumstance  Speech Logical/coherent  Interaction Assertive  Motor Activity Slow  Appearance/Hygiene Unremarkable  Behavior Characteristics Anxious  Mood Anxious;Preoccupied  Thought Process  Coherency WDL  Content WDL  Delusions None reported or observed  Perception WDL  Hallucination None reported or observed  Judgment Impaired  Confusion None  Danger to Self  Current suicidal ideation? Denies  Danger to Others  Danger to Others None reported or observed

## 2021-06-05 NOTE — Progress Notes (Signed)
Patient denies SI, HI, and AVH. Patient is calm and cooperative with assessment. He denies pain or any other physical concerns. Patient is medication compliant. Support and encouragement given. He remains safe on the unit at this time.

## 2021-06-06 MED ORDER — PALIPERIDONE ER 6 MG PO TB24: 12.0000 mg | ORAL_TABLET | Freq: Every day | ORAL | 1 refills | Status: AC

## 2021-06-06 NOTE — Plan of Care (Signed)
  Problem: Group Participation Goal: STG - Patient will engage in interactions with peers and staff in pro-social manner at least 2x within 5 recreation therapy group sessions Description: STG - Patient will engage in interactions with peers and staff in pro-social manner at least 2x within 5 recreation therapy group sessions 06/06/2021 1334 by Alveria Apley, LRT Outcome: Adequate for Discharge 06/06/2021 1334 by Alveria Apley, LRT Outcome: Adequate for Discharge

## 2021-06-06 NOTE — Progress Notes (Signed)
Recreation Therapy Notes  INPATIENT RECREATION TR PLAN  Patient Details Name: Justin Weeks MRN: [id number] DOB: 1960-09-19 Today's Date: 06/06/2021  Rec Therapy Plan Is patient appropriate for Therapeutic Recreation?: Yes Treatment times per week: At least 3 Estimated Length of Stay: 5-7 days TR Treatment/Interventions: Group participation (Comment)  Discharge Criteria Pt will be discharged from therapy if:: Discharged Treatment plan/goals/alternatives discussed and agreed upon by:: Patient/family  Discharge Summary Short term goals set: Patient will engage in interactions with peers and staff in pro-social manner at least 2x within 5 recreation therapy group sessions Short term goals met: Complete Progress toward goals comments: Groups attended Which groups?: Social skills, Leisure education Reason goals not met: N/A Therapeutic equipment acquired: N/A Reason patient discharged from therapy: Discharge from hospital Pt/family agrees with progress & goals achieved: Yes Date patient discharged from therapy: 06/06/21   Preslynn Bier 06/06/2021, 1:36 PM

## 2021-06-06 NOTE — Care Management Important Message (Signed)
Important Message  Patient Details  Name: Justin Weeks MRN: [id number] Date of Birth: [birth year]   Medicare Important Message Given:  Yes     Alanzo Lamb A [place], LCSWA 06/06/2021, 9:45 AM

## 2021-06-06 NOTE — Progress Notes (Signed)
Patient denies SI, HI, and AVH.  Pt was educated on prescriptions and follow up care. Pt questions were answered and pt verbalized understanding and did not voice any concerns. Pt's belongings were returned. Pt was safely discharged to [facility].

## 2021-06-06 NOTE — Progress Notes (Signed)
Recreation Therapy Notes   Date: 06/06/2021  Time: 10:00 am   Location: Craft room    Behavioral response: N/A   Intervention Topic: Stress Management   Discussion/Intervention: Patient did not attend group.   Clinical Observations/Feedback:  Patient did not attend group.   Shion Bluestein LRT/CTRS          [employer] 06/06/2021 12:50 PM

## 2021-06-06 NOTE — BHH Suicide Risk Assessment (Signed)
[facility] Discharge Suicide Risk Assessment   Principal Problem: Schizoaffective disorder, bipolar type [facility]) Discharge Diagnoses: Principal Problem:   Schizoaffective disorder, bipolar type (HCC) Active Problems:   Noncompliance   Hypothyroidism   HTN (hypertension)   Dyslipidemia   Tobacco use disorder   Chronic kidney disease, stage II (mild)   Memory difficulty   Type 2 diabetes mellitus without complications (HCC)   Total Time spent with patient: 35 minutes- 25 minutes face-to-face contact with patient, 10 minutes documentation, coordination of care, scripts   Musculoskeletal: Strength & Muscle Tone: within normal limits Gait & Station: normal Patient leans: N/A  Psychiatric Specialty Exam  Presentation  General Appearance: Appropriate for Environment; Casual  Eye Contact:Fair  Speech:Normal Rate  Speech Volume:Normal  Handedness:Right   Mood and Affect  Mood:Euthymic  Duration of Depression Symptoms: No data recorded Affect:Congruent   Thought Process  Thought Processes:Goal Directed; Coherent  Descriptions of Associations:Intact  Orientation:Full (Time, Place and Person)  Thought Content:Logical  History of Schizophrenia/Schizoaffective disorder:Yes  Duration of Psychotic Symptoms:Greater than six months  Hallucinations:Hallucinations: None  Ideas of Reference:None  Suicidal Thoughts:Suicidal Thoughts: No  Homicidal Thoughts:Homicidal Thoughts: No   Sensorium  Memory:Immediate Fair  Judgment:Intact  Insight:Shallow   Executive Functions  Concentration:Fair  Attention Span:Fair  Recall:Fair  Fund of Knowledge:Fair  Language:Fair   Psychomotor Activity  Psychomotor Activity:Psychomotor Activity: Normal   Assets  Assets:Communication Skills; Desire for Improvement; Financial Resources/Insurance; Housing; Physical Health; Resilience; Social Support   Sleep  Sleep:Sleep: Good Number of Hours of Sleep: 8   Physical  Exam: Physical Exam ROS Blood pressure 109/86, pulse 93, temperature 98.6 F (37 C), temperature source Oral, resp. rate 18, height 5\' 8"  (1.727 m), weight 64.9 kg, SpO2 98 %. Body mass index is 21.74 kg/m.  Mental Status Per Nursing Assessment::   On Admission:  NA  Demographic Factors:  Male and Living alone  Loss Factors: NA  Historical Factors: Impulsivity  Risk Reduction Factors:   Religious beliefs about death, Positive social support, Positive therapeutic relationship, and Positive coping skills or problem solving skills  Continued Clinical Symptoms:  Schizophrenia:   Paranoid or undifferentiated type Previous Psychiatric Diagnoses and Treatments Medical Diagnoses and Treatments/Surgeries  Cognitive Features That Contribute To Risk:  None    Suicide Risk:  Minimal: No identifiable suicidal ideation.  Patients presenting with no risk factors but with morbid ruminations; may be classified as minimal risk based on the severity of the depressive symptoms   Follow-up Information     [phone] [facility] Follow up on 06/06/2021.   Why: You have an appointment scheduled with your act team member Thursday 06/06/21. Thanks! Contact information: [address] [place] [phone]                 Plan Of Care/Follow-up recommendations:  Activity:  as tolerated Diet:  carb modified diet  [phone], MD 06/06/2021, 9:30 AM

## 2021-06-06 NOTE — Discharge Summary (Signed)
Physician Discharge Summary Note  Patient:  Justin Weeks is an 60 y.o., male MRN:  [id number] DOB:  [birth year] Patient phone:  [phone] (home)  Patient address:   [address] [place] [address],  Total Time spent with patient: 35 minutes- 25 minutes face-to-face contact with patient, 10 minutes documentation, coordination of care, scripts   Date of Admission:  05/31/2021 Date of Discharge: 06/06/2021  Reason for Admission:  60 year old man with chronic mental health problems called 911 himself.  He felt his apartment was haunted by ghosts, and was hearing voices constantly telling him to do things.    Principal Problem: Schizoaffective disorder, bipolar type [facility]) Discharge Diagnoses: Principal Problem:   Schizoaffective disorder, bipolar type (HCC) Active Problems:   Noncompliance   Hypothyroidism   HTN (hypertension)   Dyslipidemia   Tobacco use disorder   Chronic kidney disease, stage II (mild)   Memory difficulty   Type 2 diabetes mellitus without complications (HCC)   Past Psychiatric History: Chronic mental health problems with several prior hospitalizations.  Has ACT team follow-up.  No known history of suicidal behavior.  When psychotic often will get grumpy and raises voice but does not have a history of significant violence.  Does not abuse substances typically  Past Medical History:  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   Diabetes mellitus without complication (HCC)    Hypertension    Hypothyroid 07/03/2015   Personal history of tobacco use, presenting hazards to health 05/31/2015   Schizoaffective disorder (HCC)    Seizures (HCC)    History reviewed. No pertinent surgical history. Family History: History reviewed. No pertinent family history. Family Psychiatric  History: None reported Social History:  Social History    Substance and Sexual Activity  Alcohol Use No     Social History   Substance and Sexual Activity  Drug Use Yes   Types: Cocaine    Social History   Socioeconomic History   Marital status: Divorced    Spouse name: Not on file   Number of children: Not on file   Years of education: Not on file   Highest education level: Not on file  Occupational History   Not on file  Tobacco Use   Smoking status: Every Day    Packs/day: 1.00    Years: 40.00    Pack years: 40.00    Types: Cigarettes   Smokeless tobacco: Never  Vaping Use   Vaping Use: Unknown  Substance and Sexual Activity   Alcohol use: No   Drug use: Yes    Types: Cocaine   Sexual activity: Yes  Other Topics Concern   Not on file  Social History Narrative   Not on file   Social Determinants of Health   Financial Resource Strain: Not on file  Food Insecurity: Not on file  Transportation Needs: Not on file  Physical Activity: Not on file  Stress: Not on file  Social Connections: Not on file    Hospital Course:   59 year old man with chronic mental health problems called 911 himself.  He felt his apartment was haunted by ghosts, and was hearing voices constantly telling him to do things. While here he was resumed on all home medications, and oral Invega was increased to 12 mg daily. His thoughts cleared and he was able to articulate that he was actually  concerned about apartment due to bed bugs, and two homeless gentleman squatting in his apartment. This was confirmed by his ACT Team, and addressed prior to discharge. He denies suicidal ideations, homicidal ideations, visual hallucinations, and auditory hallucinations. He feels safe to return home while his ACT Team continues to look for alternative placement.   Physical Findings: AIMS: Facial and Oral Movements Muscles of Facial Expression: None, normal Lips and Perioral Area: None, normal Jaw: None, normal Tongue: None, normal,Extremity Movements Upper  (arms, wrists, hands, fingers): None, normal Lower (legs, knees, ankles, toes): None, normal, Trunk Movements Neck, shoulders, hips: None, normal, Overall Severity Severity of abnormal movements (highest score from questions above): None, normal Incapacitation due to abnormal movements: None, normal Patient's awareness of abnormal movements (rate only patient's report): No Awareness, Dental Status Current problems with teeth and/or dentures?: No Does patient usually wear dentures?: No  CIWA:    COWS:     Musculoskeletal: Strength & Muscle Tone: within normal limits Gait & Station: normal Patient leans: N/A   Psychiatric Specialty Exam:  Presentation  General Appearance: Appropriate for Environment; Casual  Eye Contact:Fair  Speech:Normal Rate  Speech Volume:Normal  Handedness:Right   Mood and Affect  Mood:Euthymic  Affect:Congruent   Thought Process  Thought Processes:Goal Directed; Coherent  Descriptions of Associations:Intact  Orientation:Full (Time, Place and Person)  Thought Content:Logical  History of Schizophrenia/Schizoaffective disorder:Yes  Duration of Psychotic Symptoms:Greater than six months  Hallucinations:Hallucinations: None  Ideas of Reference:None  Suicidal Thoughts:Suicidal Thoughts: No  Homicidal Thoughts:Homicidal Thoughts: No   Sensorium  Memory:Immediate Fair  Judgment:Intact  Insight:Shallow   Executive Functions  Concentration:Fair  Attention Span:Fair  Recall:Fair  Fund of Knowledge:Fair  Language:Fair   Psychomotor Activity  Psychomotor Activity:Psychomotor Activity: Normal   Assets  Assets:Communication Skills; Desire for Improvement; Financial Resources/Insurance; Housing; Physical Health; Resilience; Social Support   Sleep  Sleep:Sleep: Good Number of Hours of Sleep: 8    Physical Exam: Physical Exam Vitals and nursing note reviewed.  Constitutional:      Appearance: Normal appearance.   HENT:     Head: Normocephalic and atraumatic.     Right Ear: External ear normal.     Left Ear: External ear normal.     Nose: Nose normal.     Mouth/Throat:     Mouth: Mucous membranes are moist.     Pharynx: Oropharynx is clear.  Eyes:     Extraocular Movements: Extraocular movements intact.     Conjunctiva/sclera: Conjunctivae normal.     Pupils: Pupils are equal, round, and reactive to light.  Cardiovascular:     Rate and Rhythm: Normal rate.     Pulses: Normal pulses.  Pulmonary:     Effort: Pulmonary effort is normal.     Breath sounds: Normal breath sounds.  Abdominal:     General: Abdomen is flat.     Palpations: Abdomen is soft.  Musculoskeletal:        General: No swelling. Normal range of motion.     Cervical back: Normal range of motion and neck supple.  Skin:    General: Skin is warm and dry.  Neurological:     General: No focal deficit present.     Mental Status: He is alert and oriented to person, place, and time.  Psychiatric:        Mood and Affect: Mood normal.        Behavior: Behavior normal.        Thought Content: Thought content normal.  Judgment: Judgment normal.   Review of Systems  Constitutional: Negative.   HENT: Negative.    Eyes: Negative.   Respiratory: Negative.    Cardiovascular: Negative.   Gastrointestinal: Negative.   Genitourinary: Negative.   Musculoskeletal: Negative.   Skin: Negative.   Neurological: Negative.   Endo/Heme/Allergies:  Positive for environmental allergies. Does not bruise/bleed easily.  Psychiatric/Behavioral:  Negative for hallucinations, substance abuse and suicidal ideas. The patient is not nervous/anxious and does not have insomnia.   Blood pressure 109/86, pulse 93, temperature 98.6 F (37 C), temperature source Oral, resp. rate 18, height 5\' 8"  (1.727 m), weight 64.9 kg, SpO2 98 %. Body mass index is 21.74 kg/m.   Social History   Tobacco Use  Smoking Status Every Day   Packs/day: 1.00    Years: 40.00   Pack years: 40.00   Types: Cigarettes  Smokeless Tobacco Never   Tobacco Cessation:  A prescription for an FDA-approved tobacco cessation medication was offered at discharge and the patient refused   Blood Alcohol level:  Lab Results  Component Value Date   [facility] <10 05/31/2021   ETH <10 03/07/2020    Metabolic Disorder Labs:  Lab Results  Component Value Date   HGBA1C 8.1 (H) 05/31/2021   MPG 186 05/31/2021   MPG 183 05/31/2021   Lab Results  Component Value Date   PROLACTIN 21.9 (H) 10/18/2019   Lab Results  Component Value Date   CHOL 128 05/31/2021   TRIG 72 05/31/2021   HDL 34 (L) 05/31/2021   CHOLHDL 3.8 05/31/2021   VLDL 14 05/31/2021   LDLCALC 80 05/31/2021   LDLCALC 71 05/31/2021    See Psychiatric Specialty Exam and Suicide Risk Assessment completed by Attending Physician prior to discharge.  Discharge destination:  Home  Is patient on multiple antipsychotic therapies at discharge:  No   Has Patient had three or more failed trials of antipsychotic monotherapy by history:  No  Recommended Plan for Multiple Antipsychotic Therapies: NA  Discharge Instructions     Diet Carb Modified   Complete by: As directed    Increase activity slowly   Complete by: As directed       Allergies as of 06/06/2021       Reactions   Haldol [haloperidol] Other (See Comments)   Reaction:  Agitation  Pt states that he also experiences lockjaw from this medication.          Medication List     STOP taking these medications    temazepam 15 MG capsule Commonly known as: RESTORIL       TAKE these medications      Indication  albuterol 108 (90 Base) MCG/ACT inhaler Commonly known as: VENTOLIN HFA Inhale 2 puffs into the lungs every 6 (six) hours as needed for wheezing or shortness of breath.  Indication: Asthma   buPROPion 100 MG tablet Commonly known as: WELLBUTRIN Take 100 mg by mouth in the morning.  Indication: Major Depressive  Disorder   clonazePAM 0.5 MG tablet Commonly known as: KLONOPIN Take 1 tablet (0.5 mg total) by mouth 2 (two) times daily.  Indication: Feeling Anxious   divalproex 500 MG 24 hr tablet Commonly known as: DEPAKOTE ER Take 1 tablet (500 mg total) by mouth every morning.  Indication: Schizoaffective   divalproex 250 MG 24 hr tablet Commonly known as: DEPAKOTE ER Take 3 tablets (750 mg total) by mouth at bedtime.  Indication: Schizoaffective   donepezil 10 MG tablet Commonly known as: ARICEPT Take  1 tablet (10 mg total) by mouth daily.  Indication: Cognitive impairment with schizophrenia   glipiZIDE 10 MG 24 hr tablet Commonly known as: GLUCOTROL XL Take 1 tablet (10 mg total) by mouth daily with breakfast.  Indication: Type 2 Diabetes   levothyroxine 50 MCG tablet Commonly known as: SYNTHROID Take 1 tablet (50 mcg total) by mouth daily at 6 (six) AM.  Indication: Underactive Thyroid   metformin 1000 MG (OSM) 24 hr tablet Commonly known as: FORTAMET Take 1 tablet (1,000 mg total) by mouth 2 (two) times daily with a meal.  Indication: Type 2 Diabetes   paliperidone 156 MG/ML Susy injection Commonly known as: INVEGA SUSTENNA Inject 1 mL (156 mg total) into the muscle every 28 (twenty-eight) days.  Indication: Schizoaffective Disorder   paliperidone 9 MG 24 hr tablet Commonly known as: INVEGA Take 1 tablet (9 mg total) by mouth daily. What changed: Another medication with the same name was added. Make sure you understand how and when to take each.  Indication: Schizophrenia   paliperidone 6 MG 24 hr tablet Commonly known as: INVEGA Take 2 tablets (12 mg total) by mouth daily. Start taking on: June 07, 2021 What changed: You were already taking a medication with the same name, and this prescription was added. Make sure you understand how and when to take each.  Indication: Schizophrenia   simvastatin 20 MG tablet Commonly known as: ZOCOR Take 1 tablet (20 mg total)  by mouth at bedtime.  Indication: High Amount of Fats in the Blood   sitaGLIPtin 100 MG tablet Commonly known as: JANUVIA Take 1 tablet (100 mg total) by mouth daily.  Indication: Type 2 Diabetes        Follow-up Information     [facility] Follow up on 06/06/2021.   Why: You have an appointment scheduled with your act team member Thursday 06/06/21. Thanks! Contact information: [address] [place] [address] [phone]                 Follow-up recommendations:  Activity:  as tolerated Diet:  carb modified diet  Comments:  30-day scripts with 1 refill of adjusted oral Invega dose sent to [facility]. If suicidal or homicidal ideations occur call 911 or proceed to nearest emergency room.   Signed: Jesse Sans, MD 06/06/2021, 9:37 AM

## 2021-06-06 NOTE — Progress Notes (Signed)
  [facility] Adult Case Management Discharge Plan :  Will you be returning to the same living situation after discharge:  Yes,  pt will be returning back to his apartment At discharge, do you have transportation home?: Yes,  pt's ACT team member will provide transportation Do you have the ability to pay for your medications: Yes,  pt has Medicare  Release of information consent forms completed and in the chart;  Patient's signature needed at discharge.  Patient to Follow up at:  Follow-up Information     [address] [facility] Follow up on 06/06/2021.   Why: You have an appointment scheduled with your act team member on Thursday 06/06/21 between 12pm and 2pm. Thanks! Contact information: [address] [place] [address] [place] [address] [phone]                 Next level of care provider has access to [facility] Link:no  Safety Planning and Suicide Prevention discussed: Yes,  pt declined to provide collateral, completed with pt     Has patient been referred to the Quitline?: Patient refused referral  Patient has been referred for addiction treatment: Pt. refused referral  Rosalin Buster A [place], LCSWA 06/06/2021, 9:46 AM

## 2021-06-06 NOTE — Group Note (Signed)
[facility] LCSW Group Therapy Note   Group Date: 06/06/2021 Start Time: 1300 End Time: 1325   Type of Therapy/Topic:  Group Therapy:  Balance in Life  Participation Level:  Did Not Attend   Description of Group:    This group will address the concept of balance and how it feels and looks when one is unbalanced. Patients will be encouraged to process areas in their lives that are out of balance, and identify reasons for remaining unbalanced. Facilitators will guide patients utilizing problem- solving interventions to address and correct the stressor making their life unbalanced. Understanding and applying boundaries will be explored and addressed for obtaining  and maintaining a balanced life. Patients will be encouraged to explore ways to assertively make their unbalanced needs known to significant others in their lives, using other group members and facilitator for support and feedback.  Therapeutic Goals: Patient will identify two or more emotions or situations they have that consume much of in their lives. Patient will identify signs/triggers that life has become out of balance:  Patient will identify two ways to set boundaries in order to achieve balance in their lives:  Patient will demonstrate ability to communicate their needs through discussion and/or role plays  Summary of Patient Progress: X  Therapeutic Modalities:   Cognitive Behavioral Therapy Solution-Focused Therapy Assertiveness Training   Silva Aamodt R Syrai Gladwin, LCSW 

## 2021-08-07 ENCOUNTER — Emergency Department: Payer: Medicare Other

## 2021-08-07 ENCOUNTER — Other Ambulatory Visit: Payer: Self-pay

## 2021-08-07 ENCOUNTER — Inpatient Hospital Stay
Admission: EM | Admit: 2021-08-07 | Discharge: 2021-08-11 | DRG: 871 | Disposition: A | Discharge status: Home / Self Care | Payer: Medicare Other | Attending: Internal Medicine | Admitting: Internal Medicine

## 2021-08-07 ENCOUNTER — Inpatient Hospital Stay: Payer: Medicare Other

## 2021-08-07 ENCOUNTER — Encounter: Payer: Self-pay | Admitting: Internal Medicine

## 2021-08-07 DIAGNOSIS — E119 Type 2 diabetes mellitus without complications: Secondary | ICD-10-CM | POA: Diagnosis not present

## 2021-08-07 DIAGNOSIS — E1122 Type 2 diabetes mellitus with diabetic chronic kidney disease: Secondary | ICD-10-CM | POA: Diagnosis present

## 2021-08-07 DIAGNOSIS — E871 Hypo-osmolality and hyponatremia: Secondary | ICD-10-CM | POA: Diagnosis present

## 2021-08-07 DIAGNOSIS — N179 Acute kidney failure, unspecified: Secondary | ICD-10-CM | POA: Diagnosis present

## 2021-08-07 DIAGNOSIS — E875 Hyperkalemia: Secondary | ICD-10-CM | POA: Diagnosis present

## 2021-08-07 DIAGNOSIS — R0602 Shortness of breath: Secondary | ICD-10-CM | POA: Diagnosis not present

## 2021-08-07 DIAGNOSIS — J440 Chronic obstructive pulmonary disease with acute lower respiratory infection: Secondary | ICD-10-CM | POA: Diagnosis present

## 2021-08-07 DIAGNOSIS — Z23 Encounter for immunization: Secondary | ICD-10-CM | POA: Diagnosis present

## 2021-08-07 DIAGNOSIS — N182 Chronic kidney disease, stage 2 (mild): Secondary | ICD-10-CM | POA: Diagnosis present

## 2021-08-07 DIAGNOSIS — R739 Hyperglycemia, unspecified: Secondary | ICD-10-CM | POA: Diagnosis present

## 2021-08-07 DIAGNOSIS — E43 Unspecified severe protein-calorie malnutrition: Secondary | ICD-10-CM | POA: Diagnosis present

## 2021-08-07 DIAGNOSIS — Z66 Do not resuscitate: Secondary | ICD-10-CM | POA: Diagnosis present

## 2021-08-07 DIAGNOSIS — F259 Schizoaffective disorder, unspecified: Secondary | ICD-10-CM | POA: Diagnosis present

## 2021-08-07 DIAGNOSIS — J441 Chronic obstructive pulmonary disease with (acute) exacerbation: Secondary | ICD-10-CM | POA: Diagnosis present

## 2021-08-07 DIAGNOSIS — Z20822 Contact with and (suspected) exposure to covid-19: Secondary | ICD-10-CM | POA: Diagnosis present

## 2021-08-07 DIAGNOSIS — F17200 Nicotine dependence, unspecified, uncomplicated: Secondary | ICD-10-CM | POA: Diagnosis present

## 2021-08-07 DIAGNOSIS — E785 Hyperlipidemia, unspecified: Secondary | ICD-10-CM | POA: Diagnosis present

## 2021-08-07 DIAGNOSIS — E1165 Type 2 diabetes mellitus with hyperglycemia: Secondary | ICD-10-CM | POA: Diagnosis present

## 2021-08-07 DIAGNOSIS — R6520 Severe sepsis without septic shock: Secondary | ICD-10-CM | POA: Diagnosis present

## 2021-08-07 DIAGNOSIS — Z681 Body mass index (BMI) 19 or less, adult: Secondary | ICD-10-CM | POA: Diagnosis not present

## 2021-08-07 DIAGNOSIS — F17210 Nicotine dependence, cigarettes, uncomplicated: Secondary | ICD-10-CM | POA: Diagnosis present

## 2021-08-07 DIAGNOSIS — J9601 Acute respiratory failure with hypoxia: Secondary | ICD-10-CM | POA: Diagnosis not present

## 2021-08-07 DIAGNOSIS — G40909 Epilepsy, unspecified, not intractable, without status epilepticus: Secondary | ICD-10-CM | POA: Diagnosis present

## 2021-08-07 DIAGNOSIS — J189 Pneumonia, unspecified organism: Secondary | ICD-10-CM | POA: Diagnosis present

## 2021-08-07 DIAGNOSIS — Z79899 Other long term (current) drug therapy: Secondary | ICD-10-CM

## 2021-08-07 DIAGNOSIS — Z7984 Long term (current) use of oral hypoglycemic drugs: Secondary | ICD-10-CM

## 2021-08-07 DIAGNOSIS — M1990 Unspecified osteoarthritis, unspecified site: Secondary | ICD-10-CM | POA: Diagnosis present

## 2021-08-07 DIAGNOSIS — Z888 Allergy status to other drugs, medicaments and biological substances status: Secondary | ICD-10-CM

## 2021-08-07 DIAGNOSIS — E039 Hypothyroidism, unspecified: Secondary | ICD-10-CM | POA: Diagnosis present

## 2021-08-07 DIAGNOSIS — I129 Hypertensive chronic kidney disease with stage 1 through stage 4 chronic kidney disease, or unspecified chronic kidney disease: Secondary | ICD-10-CM | POA: Diagnosis present

## 2021-08-07 DIAGNOSIS — A419 Sepsis, unspecified organism: Secondary | ICD-10-CM | POA: Diagnosis present

## 2021-08-07 DIAGNOSIS — I10 Essential (primary) hypertension: Secondary | ICD-10-CM | POA: Diagnosis present

## 2021-08-07 DIAGNOSIS — Z79890 Hormone replacement therapy (postmenopausal): Secondary | ICD-10-CM

## 2021-08-07 LAB — CBC
HCT: 36 % — ABNORMAL LOW (ref 39.0–52.0)
Hemoglobin: 12 g/dL — ABNORMAL LOW (ref 13.0–17.0)
MCH: 28 pg (ref 26.0–34.0)
MCHC: 33.3 g/dL (ref 30.0–36.0)
MCV: 83.9 fL (ref 80.0–100.0)
Platelets: 399 10*3/uL (ref 150–400)
RBC: 4.29 MIL/uL (ref 4.22–5.81)
RDW: 14.2 % (ref 11.5–15.5)
WBC: 17.8 10*3/uL — ABNORMAL HIGH (ref 4.0–10.5)
nRBC: 0.5 % — ABNORMAL HIGH (ref 0.0–0.2)

## 2021-08-07 LAB — BRAIN NATRIURETIC PEPTIDE: B Natriuretic Peptide: 73.4 pg/mL (ref 0.0–100.0)

## 2021-08-07 LAB — BLOOD GAS, VENOUS
Acid-Base Excess: 5.5 mmol/L — ABNORMAL HIGH (ref 0.0–2.0)
Bicarbonate: 32.6 mmol/L — ABNORMAL HIGH (ref 20.0–28.0)
FIO2: 0.4
O2 Saturation: 57.8 %
Patient temperature: 37
pCO2, Ven: 59 mmHg (ref 44.0–60.0)
pH, Ven: 7.35 (ref 7.250–7.430)
pO2, Ven: 32 mmHg (ref 32.0–45.0)

## 2021-08-07 LAB — BASIC METABOLIC PANEL
Anion gap: 11 (ref 5–15)
BUN: 33 mg/dL — ABNORMAL HIGH (ref 8–23)
CO2: 28 mmol/L (ref 22–32)
Calcium: 9.1 mg/dL (ref 8.9–10.3)
Chloride: 92 mmol/L — ABNORMAL LOW (ref 98–111)
Creatinine, Ser: 1.45 mg/dL — ABNORMAL HIGH (ref 0.61–1.24)
GFR, Estimated: 54 mL/min — ABNORMAL LOW (ref >60)
Glucose, Bld: 432 mg/dL — ABNORMAL HIGH (ref 70–99)
Potassium: 5.1 mmol/L (ref 3.5–5.1)
Sodium: 131 mmol/L — ABNORMAL LOW (ref 135–145)

## 2021-08-07 LAB — TROPONIN I (HIGH SENSITIVITY)
Troponin I (High Sensitivity): 10 ng/L (ref <18)
Troponin I (High Sensitivity): 6 ng/L (ref <18)

## 2021-08-07 LAB — RESP PANEL BY RT-PCR (FLU A&B, COVID) ARPGX2
Influenza A by PCR: NEGATIVE
Influenza B by PCR: NEGATIVE
SARS Coronavirus 2 by RT PCR: NEGATIVE

## 2021-08-07 MED ORDER — MORPHINE SULFATE (PF) 2 MG/ML IV SOLN: 2.0000 mg | INTRAVENOUS | Status: DC | PRN

## 2021-08-07 MED ORDER — PALIPERIDONE PALMITATE ER 156 MG/ML IM SUSY: 156.0000 mg | PREFILLED_SYRINGE | INTRAMUSCULAR | Status: DC

## 2021-08-07 MED ORDER — METHYLPREDNISOLONE SODIUM SUCC 125 MG IJ SOLR
60.0000 mg | Freq: Two times a day (BID) | INTRAMUSCULAR | Status: DC
  Administered 2021-08-08: 60 mg via INTRAVENOUS
  Filled 2021-08-07: qty 2

## 2021-08-07 MED ORDER — LACTATED RINGERS IV SOLN: INTRAVENOUS | Status: AC

## 2021-08-07 MED ORDER — ACETAMINOPHEN 325 MG PO TABS: 650.0000 mg | ORAL_TABLET | Freq: Four times a day (QID) | ORAL | Status: DC | PRN

## 2021-08-07 MED ORDER — GUAIFENESIN ER 600 MG PO TB12: 600.0000 mg | ORAL_TABLET | Freq: Two times a day (BID) | ORAL | Status: DC | PRN

## 2021-08-07 MED ORDER — PREDNISONE 20 MG PO TABS: 40.0000 mg | ORAL_TABLET | Freq: Every day | ORAL | Status: DC

## 2021-08-07 MED ORDER — HYDROCODONE-ACETAMINOPHEN 5-325 MG PO TABS: 1.0000 | ORAL_TABLET | ORAL | Status: DC | PRN

## 2021-08-07 MED ORDER — DIVALPROEX SODIUM ER 500 MG PO TB24
750.0000 mg | ORAL_TABLET | Freq: Every day | ORAL | Status: DC
  Administered 2021-08-08 – 2021-08-10 (×4): 750 mg via ORAL
  Filled 2021-08-07: qty 3
  Filled 2021-08-07 (×5): qty 1

## 2021-08-07 MED ORDER — LACTATED RINGERS IV BOLUS: 1000.0000 mL | Freq: Once | INTRAVENOUS | Status: DC

## 2021-08-07 MED ORDER — POLYETHYLENE GLYCOL 3350 17 G PO PACK: 17.0000 g | PACK | Freq: Every day | ORAL | Status: DC | PRN

## 2021-08-07 MED ORDER — SODIUM CHLORIDE 0.9% FLUSH
3.0000 mL | Freq: Two times a day (BID) | INTRAVENOUS | Status: DC
  Administered 2021-08-07 – 2021-08-11 (×8): 3 mL via INTRAVENOUS

## 2021-08-07 MED ORDER — MAGNESIUM SULFATE 2 GM/50ML IV SOLN
2.0000 g | Freq: Once | INTRAVENOUS | Status: AC
  Administered 2021-08-07: 2 g via INTRAVENOUS
  Filled 2021-08-07: qty 50

## 2021-08-07 MED ORDER — IOHEXOL 350 MG/ML SOLN
75.0000 mL | Freq: Once | INTRAVENOUS | Status: AC | PRN
  Administered 2021-08-07: 75 mL via INTRAVENOUS

## 2021-08-07 MED ORDER — ONDANSETRON HCL 4 MG/2ML IJ SOLN: 4.0000 mg | Freq: Four times a day (QID) | INTRAMUSCULAR | Status: DC | PRN

## 2021-08-07 MED ORDER — ACETAMINOPHEN 650 MG RE SUPP: 650.0000 mg | Freq: Four times a day (QID) | RECTAL | Status: DC | PRN

## 2021-08-07 MED ORDER — ONDANSETRON HCL 4 MG PO TABS: 4.0000 mg | ORAL_TABLET | Freq: Four times a day (QID) | ORAL | Status: DC | PRN

## 2021-08-07 MED ORDER — NICOTINE 14 MG/24HR TD PT24
14.0000 mg | MEDICATED_PATCH | Freq: Every day | TRANSDERMAL | Status: DC
  Administered 2021-08-08: 14 mg via TRANSDERMAL
  Filled 2021-08-07 (×2): qty 1

## 2021-08-07 MED ORDER — IPRATROPIUM-ALBUTEROL 0.5-2.5 (3) MG/3ML IN SOLN
3.0000 mL | Freq: Four times a day (QID) | RESPIRATORY_TRACT | Status: DC
  Administered 2021-08-08 – 2021-08-10 (×8): 3 mL via RESPIRATORY_TRACT
  Filled 2021-08-07 (×12): qty 3

## 2021-08-07 MED ORDER — IPRATROPIUM-ALBUTEROL 0.5-2.5 (3) MG/3ML IN SOLN: 9.0000 mL | Freq: Once | RESPIRATORY_TRACT | Status: AC

## 2021-08-07 MED ORDER — ENOXAPARIN SODIUM 40 MG/0.4ML IJ SOSY
40.0000 mg | PREFILLED_SYRINGE | INTRAMUSCULAR | Status: DC
  Administered 2021-08-08 – 2021-08-10 (×4): 40 mg via SUBCUTANEOUS
  Filled 2021-08-07 (×4): qty 0.4

## 2021-08-07 MED ORDER — BUPROPION HCL 100 MG PO TABS
100.0000 mg | ORAL_TABLET | Freq: Every morning | ORAL | Status: DC
  Administered 2021-08-08 – 2021-08-11 (×4): 100 mg via ORAL
  Filled 2021-08-07 (×4): qty 1

## 2021-08-07 MED ORDER — DIVALPROEX SODIUM ER 500 MG PO TB24
500.0000 mg | ORAL_TABLET | Freq: Every morning | ORAL | Status: DC
  Administered 2021-08-08 – 2021-08-11 (×4): 500 mg via ORAL
  Filled 2021-08-07 (×3): qty 1
  Filled 2021-08-07: qty 2

## 2021-08-07 MED ORDER — LEVOTHYROXINE SODIUM 50 MCG PO TABS
50.0000 ug | ORAL_TABLET | Freq: Every day | ORAL | Status: DC
  Administered 2021-08-08 – 2021-08-11 (×4): 50 ug via ORAL
  Filled 2021-08-07 (×4): qty 1

## 2021-08-07 MED ORDER — HYDRALAZINE HCL 20 MG/ML IJ SOLN: 5.0000 mg | INTRAMUSCULAR | Status: DC | PRN

## 2021-08-07 MED ORDER — SIMVASTATIN 20 MG PO TABS
20.0000 mg | ORAL_TABLET | Freq: Every day | ORAL | Status: DC
  Administered 2021-08-08 – 2021-08-10 (×4): 20 mg via ORAL
  Filled 2021-08-07: qty 1
  Filled 2021-08-07: qty 2
  Filled 2021-08-07 (×2): qty 1

## 2021-08-07 MED ORDER — SODIUM CHLORIDE 0.9 % IV SOLN
2.0000 g | Freq: Two times a day (BID) | INTRAVENOUS | Status: DC
  Administered 2021-08-08: 2 g via INTRAVENOUS
  Filled 2021-08-07 (×2): qty 2

## 2021-08-07 MED ORDER — DOCUSATE SODIUM 100 MG PO CAPS
100.0000 mg | ORAL_CAPSULE | Freq: Two times a day (BID) | ORAL | Status: DC
  Administered 2021-08-08 – 2021-08-10 (×5): 100 mg via ORAL
  Filled 2021-08-07 (×7): qty 1

## 2021-08-07 MED ORDER — ALBUTEROL SULFATE (2.5 MG/3ML) 0.083% IN NEBU: 2.5000 mg | INHALATION_SOLUTION | RESPIRATORY_TRACT | Status: DC | PRN

## 2021-08-07 MED ORDER — IPRATROPIUM-ALBUTEROL 0.5-2.5 (3) MG/3ML IN SOLN
RESPIRATORY_TRACT | Status: AC
  Administered 2021-08-07: 9 mL via RESPIRATORY_TRACT
  Filled 2021-08-07: qty 9

## 2021-08-07 MED ORDER — METHYLPREDNISOLONE SODIUM SUCC 125 MG IJ SOLR
125.0000 mg | Freq: Once | INTRAMUSCULAR | Status: AC
  Administered 2021-08-07: 125 mg via INTRAVENOUS
  Filled 2021-08-07: qty 2

## 2021-08-07 MED ORDER — BISACODYL 5 MG PO TBEC: 5.0000 mg | DELAYED_RELEASE_TABLET | Freq: Every day | ORAL | Status: DC | PRN

## 2021-08-07 MED ORDER — PALIPERIDONE ER 3 MG PO TB24
12.0000 mg | ORAL_TABLET | Freq: Every day | ORAL | Status: DC
  Administered 2021-08-09 – 2021-08-11 (×3): 12 mg via ORAL
  Filled 2021-08-07 (×4): qty 4

## 2021-08-07 MED ORDER — DONEPEZIL HCL 5 MG PO TABS
10.0000 mg | ORAL_TABLET | Freq: Every day | ORAL | Status: DC
  Administered 2021-08-08 – 2021-08-11 (×5): 10 mg via ORAL
  Filled 2021-08-07 (×5): qty 2

## 2021-08-07 NOTE — ED Notes (Signed)
Pts O2 sats decreased to 89% while on BiPAP - RT notified and present at the bedside to make adjustments. O2 sats improved to 100%.

## 2021-08-07 NOTE — ED Provider Notes (Signed)
[facility] Emergency Department Provider Note   ____________________________________________   Event Date/Time   First MD Initiated Contact with Patient 08/07/21 2038     (approximate)  I have reviewed the triage vital signs and the nursing notes.   HISTORY  Chief Complaint Shortness of Breath and Cough    HPI Justin Weeks is a 60 y.o. male with past medical history of hypertension, hyperlipidemia, CKD, diabetes, seizures, asthma, and schizoaffective disorder who presents to the ED complaining of cough and shortness of breath.  Patient reports that he has been dealing with a productive cough for about the past 3 weeks and has been increasingly short of breath over the past 2 weeks.  He additionally endorses pain in his chest but is unsure how long this has been going on for and is not able to describe the pain.  He denies any fevers, nausea, vomiting, or abdominal pain.  He denies any pain or swelling in his legs.  He states he has not been taking any medications for it at home and he is not aware of any sick contacts.        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   Diabetes mellitus without complication ([place])    Hypertension    Hypothyroid 07/03/2015   Personal history of tobacco use, presenting hazards to health 05/31/2015   Schizoaffective disorder ([place])    Seizures ([place])     Patient Active Problem List   Diagnosis Date Noted   Psychosis ([place]) 02/21/2020   Schizoaffective disorder ([place]) 10/17/2019   Suicidal ideation    Memory difficulty 01/03/2019   Schizoaffective disorder, bipolar type ([place]) 07/04/2015   HTN (hypertension) 07/04/2015   Dyslipidemia 07/04/2015   Tobacco use disorder 07/04/2015   Tobacco dependence syndrome 07/04/2015   Noncompliance 07/03/2015   Hypothyroidism 07/03/2015   Diabetes ([place])  04/20/2015   Chronic kidney disease, 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  Prior to Admission medications   Medication Sig Start Date End Date Taking? Authorizing Provider  albuterol (VENTOLIN HFA) 108 (90 Base) MCG/ACT inhaler Inhale 2 puffs into the lungs every 6 (six) hours as needed for wheezing or shortness of breath. Patient not taking: No sig reported 07/12/20   Johnn Hai, PA-C  buPROPion [facility]) 100 MG tablet Take 100 mg by mouth in the morning.    [provider]  clonazePAM (KLONOPIN) 0.5 MG tablet Take 1 tablet (0.5 mg total) by mouth 2 (two) times daily. Patient not taking: No sig reported 02/24/20   Clapacs, Madie Reno, MD  divalproex (DEPAKOTE ER) 250 MG 24 hr tablet Take 3 tablets (750 mg total) by mouth at bedtime. 02/24/20   Clapacs, Madie Reno, MD  divalproex (DEPAKOTE ER) 500 MG 24 hr tablet Take 1 tablet (500 mg total) by mouth every morning. 02/24/20   Clapacs, Madie Reno, MD  donepezil (ARICEPT) 10 MG tablet Take 1 tablet (10 mg total) by mouth daily. 02/24/20   Clapacs, Madie Reno, MD  glipiZIDE (GLUCOTROL XL) 10 MG 24 hr tablet Take 1 tablet (10 mg total) by mouth daily with breakfast. 02/25/20   Clapacs, Madie Reno, MD  levothyroxine (SYNTHROID) 50  MCG tablet Take 1 tablet (50 mcg total) by mouth daily at 6 (six) AM. 02/25/20   Clapacs, Madie Reno, MD  metformin (FORTAMET) 1000 MG (OSM) 24 hr tablet Take 1 tablet (1,000 mg total) by mouth 2 (two) times daily with a meal. 02/24/20   Clapacs, Madie Reno, MD  paliperidone (INVEGA SUSTENNA) 156 MG/ML SUSY injection Inject 1 mL (156 mg total) into the muscle every 28 (twenty-eight) days. 02/27/20   Clapacs, Madie Reno, MD  paliperidone (INVEGA) 6 MG 24 hr tablet Take 2 tablets (12 mg total) by mouth daily. 06/07/21   Salley Scarlet, MD   paliperidone (INVEGA) 9 MG 24 hr tablet Take 1 tablet (9 mg total) by mouth daily. 02/25/20   Clapacs, Madie Reno, MD  simvastatin (ZOCOR) 20 MG tablet Take 1 tablet (20 mg total) by mouth at bedtime. 02/24/20   Clapacs, Madie Reno, MD  sitaGLIPtin (JANUVIA) 100 MG tablet Take 1 tablet (100 mg total) by mouth daily. 02/24/20   Clapacs, Madie Reno, MD    Allergies Haldol [haloperidol]  No family history on file.  Social History Social History   Tobacco Use   Smoking status: Every Day    Packs/day: 1.00    Years: 40.00    Pack years: 40.00    Types: Cigarettes   Smokeless tobacco: Never  Vaping Use   Vaping Use: Unknown  Substance Use Topics   Alcohol use: No   Drug use: Yes    Types: Cocaine    Review of Systems  Constitutional: No fever/chills Eyes: No visual changes. ENT: No sore throat. Cardiovascular: Positive for chest pain. Respiratory: Positive for cough and shortness of breath. Gastrointestinal: No abdominal pain.  No nausea, no vomiting.  No diarrhea.  No constipation. Genitourinary: Negative for dysuria. Musculoskeletal: Negative for back pain. Skin: Negative for rash. Neurological: Negative for headaches, focal weakness or numbness.  ____________________________________________   PHYSICAL EXAM:  VITAL SIGNS: ED Triage Vitals  Enc Vitals Group     BP 08/07/21 2038 113/65     Pulse Rate 08/07/21 2038 (!) 109     Resp 08/07/21 2038 20     Temp 08/07/21 2038 98.6 F (37 C)     Temp Source 08/07/21 2038 Oral     SpO2 08/07/21 2038 (!) 84 %     Weight 08/07/21 2032 125 lb (56.7 kg)     Height 08/07/21 2032 5\' 8"  (1.727 m)     Head Circumference --      Peak Flow --      Pain Score 08/07/21 2031 0     Pain Loc --      Pain Edu? --      Excl. in [place]? --     Constitutional: Alert and oriented. Eyes: Conjunctivae are normal. Head: Atraumatic. Nose: No congestion/rhinnorhea. Mouth/Throat: Mucous membranes are moist. Neck: Normal ROM Cardiovascular:  Tachycardic, regular rhythm. Grossly normal heart sounds.  2+ radial pulses bilaterally. Respiratory: Tachypneic with increased respiratory effort and accessory muscle use. Lungs with very poor air movement throughout, faint expiratory wheezing noted. Gastrointestinal: Soft and nontender. No distention. Genitourinary: deferred Musculoskeletal: No lower extremity tenderness nor edema. Neurologic:  Normal speech and language. No gross focal neurologic deficits are appreciated. Skin:  Skin is warm, dry and intact. No rash noted. Psychiatric: Mood and affect are normal. Speech and behavior are normal.  ____________________________________________   LABS (all labs ordered are listed, but only abnormal results are displayed)  Labs Reviewed  BASIC METABOLIC PANEL - Abnormal;  Notable for the following components:      Result Value   Sodium 131 (*)    Chloride 92 (*)    Glucose, Bld 432 (*)    BUN 33 (*)    Creatinine, Ser 1.45 (*)    GFR, Estimated 54 (*)    All other components within normal limits  CBC - Abnormal; Notable for the following components:   WBC 17.8 (*)    Hemoglobin 12.0 (*)    HCT 36.0 (*)    nRBC 0.5 (*)    All other components within normal limits  BLOOD GAS, VENOUS - Abnormal; Notable for the following components:   Bicarbonate 32.6 (*)    Acid-Base Excess 5.5 (*)    All other components within normal limits  RESP PANEL BY RT-PCR (FLU A&B, COVID) ARPGX2  BRAIN NATRIURETIC PEPTIDE  TROPONIN I (HIGH SENSITIVITY)  TROPONIN I (HIGH SENSITIVITY)   ____________________________________________  EKG  ED ECG REPORT I, Chesley Noon, the attending physician, personally viewed and interpreted this ECG.   Date: 08/07/2021  EKG Time: 20:50  Rate: 97  Rhythm: normal sinus rhythm  Axis: Normal  Intervals:none  ST&T Change: None   PROCEDURES  Procedure(s) performed (including Critical Care):  .Critical Care Performed by: Chesley Noon, MD Authorized by:  Chesley Noon, MD   Critical care provider statement:    Critical care time (minutes):  45   Critical care time was exclusive of:  Separately billable procedures and treating other patients and teaching time   Critical care was necessary to treat or prevent imminent or life-threatening deterioration of the following conditions:  Respiratory failure   Critical care was time spent personally by me on the following activities:  Development of treatment plan with patient or surrogate, discussions with consultants, evaluation of patient's response to treatment, examination of patient, ordering and review of laboratory studies, ordering and review of radiographic studies, ordering and performing treatments and interventions, pulse oximetry, re-evaluation of patient's condition and review of old charts   I assumed direction of critical care for this patient from another provider in my specialty: no     Care discussed with: admitting provider     ____________________________________________   INITIAL IMPRESSION / ASSESSMENT AND PLAN / ED COURSE      60 year old male with past medical history of hypertension, hyperlipidemia, diabetes, CKD, seizures, asthma, and schizoaffective disorder who presents to the ED with 3 weeks of cough followed by 2 weeks of increasing shortness of breath and discomfort in his chest.  Patient is in significant respiratory distress on arrival, noted to have very poor air movement with faint expiratory wheezing along with O2 sats in 70s on room air.  He was placed on 6 L nasal cannula with improvement in O2 sats to around 85%, we will treat with duo nebs and steroids, respiratory therapy was contacted to transition patient to BiPAP.  Place patient on BiPAP.  EKG shows no evidence of arrhythmia or ischemia and I have low suspicion for ACS or PE.  Troponin within normal limits, no findings to suggest ACS.  Chest x-ray reviewed by me and shows mild pulmonary edema however patient  does not appear clinically fluid overloaded and BNP is within normal limits, we will hold off on diuresis.  On reassessment, patient is breathing much more comfortably on BiPAP and wheezing is improved.  Case discussed with hospitalist for admission for further management of COPD exacerbation.      ____________________________________________   FINAL CLINICAL IMPRESSION(S) / ED  DIAGNOSES  Final diagnoses:  COPD exacerbation ([place])  Acute respiratory failure with hypoxia [facility])     ED Discharge Orders     None        Note:  This document was prepared using Dragon voice recognition software and may include unintentional dictation errors.    Blake Divine, MD 08/07/21 2213

## 2021-08-07 NOTE — H&P (Signed)
History and Physical    THADEN ABAJIAN [id number] DOB: [birth year] DOA: 08/07/2021  PCP: Pcp, No    Patient coming from:  Home    Chief Complaint:  Cough    HPI:  Justin Weeks is a 60 y.o. male seen in ed with complaints of  cough x 12 days and sob. And tachycardia when he is coughing.  Has a history of tobacco abuse unable to tell me how many years his last coughing episode was  was patient.  Patient reports chest discomfort but he is not able to describe the chest pain and it is currently resolved.  Patient denies any fever nausea vomiting or abdominal pain.   Pt has past medical history of medical history of arthritis, schizoaffective disorder.  Patient has past medical history of allergies to Haldol.,  Tobacco abuse.  ED Course:  Vitals:   08/07/21 2130 08/07/21 2200 08/07/21 2230 08/07/21 2300  BP: 117/82 105/72 114/81 117/78  Pulse: (!) 103 100 97 83  Resp: (!) 24 (!) 28 (!) 24 (!) 28  Temp:      TempSrc:      SpO2: 98% 99% 98% 98%  Weight:      Height:      In ed pt is alert and awake.  Meets sepsis criteria.  Started on BiPAP for his hypoxia. Initial VBG shows normal PCO2 normal PO2 and normal pH, CMP shows sodium of 131 chloride 92 glucose 432 creatinine 1.45 which is acutely and normal previous creatinine of 1.24.  BNP of 73.4, troponin of 10, WBC count of 17.8, hemoglobin of 12.0, initial EKG sinus rhythm 97. Patient is also on BiPAP becomes hypoxic.  Review of Systems:  ROS Unable to obtain as pt is on bipap and cannot understand.  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 No pertinent surgical  history.   reports that he has been smoking cigarettes. He has a 40.00 pack-year smoking history. He has never used smokeless tobacco. He reports current drug use. Drug: Cocaine. He reports that he does not drink alcohol.  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      History reviewed. No pertinent family history.  Prior to Admission medications   Medication Sig Start Date End Date Taking? Authorizing Provider  albuterol (VENTOLIN HFA) 108 (90 Base) MCG/ACT inhaler Inhale 2 puffs into the lungs every 6 (six) hours as needed for wheezing or shortness of breath. Patient not taking: No sig reported 07/12/20   Johnn Hai, PA-C  buPROPion [facility]) 100 MG tablet Take 100 mg by mouth in the morning.    [provider]  clonazePAM (KLONOPIN) 0.5 MG tablet Take 1 tablet (0.5 mg total) by mouth 2 (two) times daily. Patient not taking: No sig reported 02/24/20   Clapacs, Madie Reno, MD  divalproex (DEPAKOTE ER) 250 MG 24 hr tablet Take 3 tablets (750 mg total) by mouth at bedtime. 02/24/20   Clapacs, Madie Reno, MD  divalproex (DEPAKOTE ER) 500  MG 24 hr tablet Take 1 tablet (500 mg total) by mouth every morning. 02/24/20   Clapacs, Madie Reno, MD  donepezil (ARICEPT) 10 MG tablet Take 1 tablet (10 mg total) by mouth daily. 02/24/20   Clapacs, Madie Reno, MD  glipiZIDE (GLUCOTROL XL) 10 MG 24 hr tablet Take 1 tablet (10 mg total) by mouth daily with breakfast. 02/25/20   Clapacs, Madie Reno, MD  levothyroxine (SYNTHROID) 50 MCG tablet Take 1 tablet (50 mcg total) by mouth daily at 6 (six) AM. 02/25/20   Clapacs, Madie Reno, MD  metformin (FORTAMET) 1000 MG (OSM) 24 hr tablet Take 1 tablet (1,000 mg total) by mouth 2 (two) times daily with a meal. 02/24/20   Clapacs, Madie Reno, MD  paliperidone (INVEGA SUSTENNA) 156 MG/ML SUSY injection Inject 1 mL (156 mg total) into the muscle every 28  (twenty-eight) days. 02/27/20   Clapacs, Madie Reno, MD  paliperidone (INVEGA) 6 MG 24 hr tablet Take 2 tablets (12 mg total) by mouth daily. 06/07/21   Salley Scarlet, MD  paliperidone (INVEGA) 9 MG 24 hr tablet Take 1 tablet (9 mg total) by mouth daily. 02/25/20   Clapacs, Madie Reno, MD  simvastatin (ZOCOR) 20 MG tablet Take 1 tablet (20 mg total) by mouth at bedtime. 02/24/20   Clapacs, Madie Reno, MD  sitaGLIPtin (JANUVIA) 100 MG tablet Take 1 tablet (100 mg total) by mouth daily. 02/24/20   Gonzella Lex, MD    Physical Exam: Vitals:   08/07/21 2130 08/07/21 2200 08/07/21 2230 08/07/21 2300  BP: 117/82 105/72 114/81 117/78  Pulse: (!) 103 100 97 83  Resp: (!) 24 (!) 28 (!) 24 (!) 28  Temp:      TempSrc:      SpO2: 98% 99% 98% 98%  Weight:      Height:       Physical Exam Vitals reviewed.  Constitutional:      Appearance: Normal appearance. He is normal weight.  HENT:     Head: Normocephalic and atraumatic.     Right Ear: External ear normal.     Left Ear: External ear normal.     Nose: Nose normal.     Mouth/Throat:     Mouth: Mucous membranes are moist.  Eyes:     Extraocular Movements: Extraocular movements intact.     Pupils: Pupils are equal, round, and reactive to light.  Neck:     Vascular: No carotid bruit.  Cardiovascular:     Rate and Rhythm: Normal rate and regular rhythm.     Pulses: Normal pulses.     Heart sounds: Normal heart sounds.  Pulmonary:     Effort: Pulmonary effort is normal.     Breath sounds: Normal breath sounds.  Abdominal:     General: Bowel sounds are normal. There is no distension.     Palpations: Abdomen is soft.     Tenderness: There is no abdominal tenderness. There is no guarding.  Musculoskeletal:        General: Normal range of motion.  Skin:    General: Skin is warm.  Neurological:     General: No focal deficit present.     Mental Status: He is oriented to person, place, and time.  Psychiatric:        Mood and Affect: Mood normal.         Behavior: Behavior normal.     Labs on Admission: I have personally reviewed following labs and imaging studies  No results  for input(s): CKTOTAL, CKMB, TROPONINI in the last 72 hours. Lab Results  Component Value Date   WBC 17.8 (H) 08/07/2021   HGB 12.0 (L) 08/07/2021   HCT 36.0 (L) 08/07/2021   MCV 83.9 08/07/2021   PLT 399 08/07/2021    Recent Labs  Lab 08/07/21 2039  NA 131*  K 5.1  CL 92*  CO2 28  BUN 33*  CREATININE 1.45*  CALCIUM 9.1  GLUCOSE 432*   Lab Results  Component Value Date   CHOL 128 05/31/2021   HDL 34 (L) 05/31/2021   LDLCALC 80 05/31/2021   TRIG 72 05/31/2021   No results found for: DDIMER Invalid input(s): POCBNP   COVID-19 Labs No results for input(s): DDIMER, FERRITIN, LDH, CRP in the last 72 hours. Lab Results  Component Value Date   SARSCOV2NAA NEGATIVE 08/07/2021   [place] NEGATIVE 05/31/2021   [place] NEGATIVE 03/08/2020   [place] NEGATIVE 02/21/2020    Radiological Exams on Admission: DG Chest 1 View  Result Date: 08/07/2021 CLINICAL DATA:  Cough and shortness of breath for several days, initial encounter EXAM: CHEST  1 VIEW COMPARISON:  08/29/2016 CT FINDINGS: Cardiac shadow is within normal limits. Mild interstitial changes are noted with mild interstitial edema. No focal infiltrate or sizable effusion is noted. No acute bony abnormality is noted. IMPRESSION: Mild pulmonary edema without focal infiltrate. Electronically Signed   By: Inez Catalina M.D.   On: 08/07/2021 21:03   CT Angio Chest Pulmonary Embolism (PE) W or WO Contrast  Result Date: 08/07/2021 CLINICAL DATA:  Cough short of breath EXAM: CT ANGIOGRAPHY CHEST WITH CONTRAST TECHNIQUE: Multidetector CT imaging of the chest was performed using the standard protocol during bolus administration of intravenous contrast. Multiplanar CT image reconstructions and MIPs were obtained to evaluate the vascular anatomy. CONTRAST:  63mL OMNIPAQUE IOHEXOL 350 MG/ML SOLN  COMPARISON:  Chest x-ray 08/07/2021, CT chest 08/29/2016 FINDINGS: Cardiovascular: Satisfactory opacification of the pulmonary arteries to the segmental level. No evidence of pulmonary embolism. Normal heart size. No pericardial effusion. Nonaneurysmal aorta. No dissection is seen. Mediastinum/Nodes: No enlarged mediastinal, hilar, or axillary lymph nodes. Thyroid gland, trachea, and esophagus demonstrate no significant findings. Lungs/Pleura: No pleural effusion. Stable 6 mm right apical lung nodule, consistent with benign finding. Bronchial wall thickening with fairly widespread ill-defined centrilobular micro nodules. More confluent areas of ground-glass density in the left upper lobe and right lower lobe, and patchy consolidation at the right middle lobe. Upper Abdomen: No acute abnormality. Musculoskeletal: No chest wall abnormality. No acute or significant osseous findings. Review of the MIP images confirms the above findings. IMPRESSION: 1. Negative for acute pulmonary embolus or aortic dissection. 2. Mild diffuse bronchial wall thickening with fairly widespread ill-defined centrilobular micro nodules and areas of ground-glass density, suggestive of respiratory bronchiolitis. Other consideration could include atypical pneumonia to include mycobacterial etiologies. 3. Stable 6 mm right apical pulmonary nodule, felt benign given lack of interval change. Electronically Signed   By: Donavan Foil M.D.   On: 08/07/2021 23:49    EKG: Independently reviewed.  CR 97.   Assessment/Plan: Principal Problem:   SOB (shortness of breath) Active Problems:   Diabetes (HCC)   Hypothyroidism   Schizoaffective disorder (HCC)   Chronic kidney disease, stage II (mild)   Tobacco dependence syndrome   Essential (primary) hypertension  SOB: Patient is a 73 male history of disorder presenting with cough for few weeks with increasing shortness of breath chest pain.  Patient found to be hypoxic on room air  and started  on BiPAP and patient has been stable since then.  He does meet severe sepsis criteria however we do not have a source, to continue his treatment as pneumonia.  We will admit patient to telemetry unit with continuous cardiac monitoring. Differentials considered in this case include acute respiratory failure with hypoxia from PE or chf.  We will also obtain CTA and  2 c echo.  Patient's initial EKG and troponins are negative.  Dm II: Ssi/ glycemic protocol.   Hypothyroidism: Continue levothyroxine Continue patient on last Depakote, Aricept,   Schizoaffective disorder DVT prophylaxis:  SCDs  Code Status:  Full code  Family Communication:  Andre, [place] (Father)  [phone] (Mobile)   Disposition Plan:  Home  Consults called:  None  Admission status: Inpatient   Gertha Calkin MD [facility] [phone] How to contact the [facility] Attending or Consulting provider 7A - 7P or covering provider during after hours 7P -7A, for this patient.    Check the care team in [facility] and look for a) attending/consulting [facility] provider listed and b) the [facility] team listed Log into [email or web address] and use [place]'s universal password to access. If you do not have the password, please contact the hospital operator. Locate the [facility] provider you are looking for under [facility] and page to a number that you can be directly reached. If you still have difficulty reaching the provider, please page the [facility] (Director on Call) for the Hospitalists listed on amion for assistance. [email or web address] Password [email or web address] 08/08/2021, 12:01 AM

## 2021-08-07 NOTE — ED Notes (Signed)
Pt to CT with CT tech & RT.

## 2021-08-07 NOTE — ED Triage Notes (Signed)
Pt to ED via EMS from home c/o cough x12 days and SOB.  EMS states respirations and HR goes up with coughing but they were unable to get pulse ox reading en route.  Pt has a hx of smoking.  When asked about pain pt states "yeah in my brain" but doesn't explain what he means by this.  Pt found to be 77% RA in triage, placed on 2L [place] and up to 82%.  Pt placed on 4L [place] and taken to room 13.

## 2021-08-07 NOTE — Progress Notes (Signed)
PHARMACY NOTE:  ANTIMICROBIAL RENAL DOSAGE ADJUSTMENT  Current antimicrobial regimen includes a mismatch between antimicrobial dosage and estimated renal function.  As per policy approved by the Pharmacy & Therapeutics and Medical Executive Committees, the antimicrobial dosage will be adjusted accordingly.  Current antimicrobial dosage:  Cefepime 2 gm IV Q8H  Indication: PNA   Renal Function:  Estimated Creatinine Clearance: 42.4 mL/min (A) (by C-G formula based on SCr of 1.45 mg/dL (H)). []      On intermittent HD, scheduled: []      On CRRT    Antimicrobial dosage has been changed to:  Cefepime 2 gm IV Q12H   Additional comments:   Thank you for allowing pharmacy to be a part of this patient's care.  Muaad Boehning D, [facility] 08/07/2021 11:20 PM

## 2021-08-08 ENCOUNTER — Encounter: Payer: Self-pay | Admitting: Internal Medicine

## 2021-08-08 DIAGNOSIS — A419 Sepsis, unspecified organism: Principal | ICD-10-CM

## 2021-08-08 DIAGNOSIS — R739 Hyperglycemia, unspecified: Secondary | ICD-10-CM

## 2021-08-08 DIAGNOSIS — N182 Chronic kidney disease, stage 2 (mild): Secondary | ICD-10-CM

## 2021-08-08 DIAGNOSIS — R6520 Severe sepsis without septic shock: Secondary | ICD-10-CM

## 2021-08-08 DIAGNOSIS — R0602 Shortness of breath: Secondary | ICD-10-CM

## 2021-08-08 DIAGNOSIS — J189 Pneumonia, unspecified organism: Secondary | ICD-10-CM

## 2021-08-08 LAB — BASIC METABOLIC PANEL
Anion gap: 9 (ref 5–15)
Anion gap: 7 (ref 5–15)
Anion gap: 14 (ref 5–15)
Anion gap: 10 (ref 5–15)
BUN: 31 mg/dL — ABNORMAL HIGH (ref 8–23)
BUN: 29 mg/dL — ABNORMAL HIGH (ref 8–23)
BUN: 17 mg/dL (ref 8–23)
BUN: 27 mg/dL — ABNORMAL HIGH (ref 8–23)
CO2: 29 mmol/L (ref 22–32)
CO2: 28 mmol/L (ref 22–32)
CO2: 19 mmol/L — ABNORMAL LOW (ref 22–32)
CO2: 30 mmol/L (ref 22–32)
Calcium: 8.6 mg/dL — ABNORMAL LOW (ref 8.9–10.3)
Calcium: 8.1 mg/dL — ABNORMAL LOW (ref 8.9–10.3)
Calcium: 7.7 mg/dL — ABNORMAL LOW (ref 8.9–10.3)
Calcium: 8.8 mg/dL — ABNORMAL LOW (ref 8.9–10.3)
Chloride: 91 mmol/L — ABNORMAL LOW (ref 98–111)
Chloride: 92 mmol/L — ABNORMAL LOW (ref 98–111)
Chloride: 101 mmol/L (ref 98–111)
Chloride: 96 mmol/L — ABNORMAL LOW (ref 98–111)
Creatinine, Ser: 1.35 mg/dL — ABNORMAL HIGH (ref 0.61–1.24)
Creatinine, Ser: 1.21 mg/dL (ref 0.61–1.24)
Creatinine, Ser: 0.73 mg/dL (ref 0.61–1.24)
Creatinine, Ser: 1.3 mg/dL — ABNORMAL HIGH (ref 0.61–1.24)
GFR, Estimated: 59 mL/min — ABNORMAL LOW (ref >60)
GFR, Estimated: >60 mL/min (ref >60)
GFR, Estimated: >60 mL/min (ref >60)
GFR, Estimated: >60 mL/min (ref >60)
Glucose, Bld: 733 mg/dL (ref 70–99)
Glucose, Bld: 630 mg/dL (ref 70–99)
Glucose, Bld: 149 mg/dL — ABNORMAL HIGH (ref 70–99)
Glucose, Bld: 226 mg/dL — ABNORMAL HIGH (ref 70–99)
Potassium: 5.9 mmol/L — ABNORMAL HIGH (ref 3.5–5.1)
Potassium: 5.8 mmol/L — ABNORMAL HIGH (ref 3.5–5.1)
Potassium: 5.4 mmol/L — ABNORMAL HIGH (ref 3.5–5.1)
Potassium: 4.8 mmol/L (ref 3.5–5.1)
Sodium: 129 mmol/L — ABNORMAL LOW (ref 135–145)
Sodium: 127 mmol/L — ABNORMAL LOW (ref 135–145)
Sodium: 134 mmol/L — ABNORMAL LOW (ref 135–145)
Sodium: 136 mmol/L (ref 135–145)

## 2021-08-08 LAB — CBG MONITORING, ED
Glucose-Capillary: >600 mg/dL (ref 70–99)
Glucose-Capillary: 574 mg/dL (ref 70–99)
Glucose-Capillary: 586 mg/dL (ref 70–99)
Glucose-Capillary: 456 mg/dL — ABNORMAL HIGH (ref 70–99)
Glucose-Capillary: 544 mg/dL (ref 70–99)

## 2021-08-08 LAB — CBC
HCT: 32.7 % — ABNORMAL LOW (ref 39.0–52.0)
Hemoglobin: 10.9 g/dL — ABNORMAL LOW (ref 13.0–17.0)
MCH: 27.7 pg (ref 26.0–34.0)
MCHC: 33.3 g/dL (ref 30.0–36.0)
MCV: 83.2 fL (ref 80.0–100.0)
Platelets: 388 10*3/uL (ref 150–400)
RBC: 3.93 MIL/uL — ABNORMAL LOW (ref 4.22–5.81)
RDW: 14.3 % (ref 11.5–15.5)
WBC: 17.4 10*3/uL — ABNORMAL HIGH (ref 4.0–10.5)
nRBC: 0.1 % (ref 0.0–0.2)

## 2021-08-08 LAB — MRSA NEXT GEN BY PCR, NASAL: MRSA by PCR Next Gen: NOT DETECTED

## 2021-08-08 LAB — HIV ANTIBODY (ROUTINE TESTING W REFLEX): HIV Screen 4th Generation wRfx: NONREACTIVE

## 2021-08-08 LAB — GLUCOSE, CAPILLARY
Glucose-Capillary: 407 mg/dL — ABNORMAL HIGH (ref 70–99)
Glucose-Capillary: 303 mg/dL — ABNORMAL HIGH (ref 70–99)
Glucose-Capillary: 195 mg/dL — ABNORMAL HIGH (ref 70–99)
Glucose-Capillary: 175 mg/dL — ABNORMAL HIGH (ref 70–99)
Glucose-Capillary: 230 mg/dL — ABNORMAL HIGH (ref 70–99)
Glucose-Capillary: 251 mg/dL — ABNORMAL HIGH (ref 70–99)
Glucose-Capillary: 221 mg/dL — ABNORMAL HIGH (ref 70–99)
Glucose-Capillary: 202 mg/dL — ABNORMAL HIGH (ref 70–99)
Glucose-Capillary: 183 mg/dL — ABNORMAL HIGH (ref 70–99)

## 2021-08-08 MED ORDER — GLUCERNA SHAKE PO LIQD: 237.0000 mL | Freq: Three times a day (TID) | ORAL | Status: DC

## 2021-08-08 MED ORDER — NEPRO/CARBSTEADY PO LIQD
237.0000 mL | Freq: Three times a day (TID) | ORAL | Status: DC
  Administered 2021-08-08 – 2021-08-10 (×5): 237 mL via ORAL

## 2021-08-08 MED ORDER — CHLORHEXIDINE GLUCONATE CLOTH 2 % EX PADS
6.0000 | MEDICATED_PAD | Freq: Every day | CUTANEOUS | Status: DC
  Administered 2021-08-08 – 2021-08-10 (×3): 6 via TOPICAL
  Filled 2021-08-08: qty 6

## 2021-08-08 MED ORDER — SODIUM CHLORIDE 0.9 % IV SOLN
2.0000 g | INTRAVENOUS | Status: DC
  Administered 2021-08-08 – 2021-08-10 (×3): 2 g via INTRAVENOUS
  Filled 2021-08-08 (×2): qty 20
  Filled 2021-08-08 (×2): qty 2

## 2021-08-08 MED ORDER — CEFTRIAXONE SODIUM 1 G IJ SOLR: 1.0000 g | INTRAMUSCULAR | Status: DC

## 2021-08-08 MED ORDER — SODIUM CHLORIDE 0.9 % IV SOLN
500.0000 mg | INTRAVENOUS | Status: AC
  Administered 2021-08-08 – 2021-08-10 (×3): 500 mg via INTRAVENOUS
  Filled 2021-08-08 (×2): qty 500
  Filled 2021-08-08: qty 5

## 2021-08-08 MED ORDER — ORAL CARE MOUTH RINSE
15.0000 mL | Freq: Two times a day (BID) | OROMUCOSAL | Status: DC
  Administered 2021-08-08 – 2021-08-10 (×5): 15 mL via OROMUCOSAL
  Filled 2021-08-08 (×2): qty 15

## 2021-08-08 MED ORDER — INFLUENZA VAC SPLIT QUAD 0.5 ML IM SUSY
0.5000 mL | PREFILLED_SYRINGE | Freq: Once | INTRAMUSCULAR | Status: AC
  Administered 2021-08-09: 0.5 mL via INTRAMUSCULAR
  Filled 2021-08-08: qty 0.5

## 2021-08-08 MED ORDER — INSULIN REGULAR(HUMAN) IN NACL 100-0.9 UT/100ML-% IV SOLN
INTRAVENOUS | Status: DC
  Administered 2021-08-08: 9.5 [IU]/h via INTRAVENOUS
  Filled 2021-08-08: qty 100

## 2021-08-08 MED ORDER — LACTATED RINGERS IV SOLN: INTRAVENOUS | Status: DC

## 2021-08-08 MED ORDER — DEXTROSE 50 % IV SOLN: 0.0000 mL | INTRAVENOUS | Status: DC | PRN

## 2021-08-08 MED ORDER — DEXTROSE IN LACTATED RINGERS 5 % IV SOLN: INTRAVENOUS | Status: DC

## 2021-08-08 MED ORDER — SODIUM ZIRCONIUM CYCLOSILICATE 10 G PO PACK
10.0000 g | PACK | Freq: Once | ORAL | Status: AC
Start: 2021-08-08 — End: 2021-08-08
  Administered 2021-08-08: 10 g via ORAL
  Filled 2021-08-08: qty 1

## 2021-08-08 MED ORDER — ADULT MULTIVITAMIN W/MINERALS CH
1.0000 | ORAL_TABLET | Freq: Every day | ORAL | Status: DC
  Administered 2021-08-09 – 2021-08-11 (×3): 1 via ORAL
  Filled 2021-08-08 (×3): qty 1

## 2021-08-08 NOTE — ED Notes (Signed)
This RN to bedside to assess pt. Pt. Refusing to wear cardiac monitoring. This RN explained the importance of cardiac monitoring, pt. Still refusing to wear. This RN encouraged pt. To at least keep BP and pulse ox monitoring intact. Pt. Agreed. Pt. Provided a pitcher of ice water.  MD notified.

## 2021-08-08 NOTE — Progress Notes (Signed)
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  GOAL:   Patient will meet greater than or equal to 90% of their needs  MONITOR:   PO intake, Supplement acceptance, Labs, Weight trends, Skin, I & O's  REASON FOR ASSESSMENT:   Consult Assessment of nutrition requirement/status  ASSESSMENT:   60 y/o male with h/o HTN, HLD, CKD, DM, seizures, asthma/COPD, schizophrenia and memory loss who is admitted with CAP and sepsis  Met with pt in room today. Pt reports poor appetite and oral intake for several months pta. Pt reports a recent 20lb weight loss. Per chart, pt is down 13lbs(9%) over the past 2 months; this is significant weight loss. Pt reports that he is hungry and wants to eat; pt has ordered dinner. Pt reports that he enjoys strawberry Ensure. RD will add mixed berry Nepro as pt with hyperglycemia and hyperkalemia; can change to Ensure or Glucerna once labs back wnl. Pt is likely at high refeed risk.   Medications reviewed and include: colace, lovenox, synthroid, MVI, nicotine, azithromycin, ceftriaxone, LRS '@125ml' /hr, insulin  Labs reviewed: Na 127(L), K 5.8(H), BUN 29(H) Wbc- 17.4(H), Hgb 10.9(L), Hct 32.7(L) Cbgs- 303, 407, 456, 544, 586, 574 x 24 hrs AIC- 8.1(H)- 9/30  NUTRITION - FOCUSED PHYSICAL EXAM:  Flowsheet Row Most Recent Value  Orbital Region Mild depletion  Upper Arm Region Severe depletion  Thoracic and Lumbar Region Severe depletion  Buccal Region Moderate depletion  Temple Region Moderate depletion  Clavicle  Bone Region Severe depletion  Clavicle and Acromion Bone Region Severe depletion  Scapular Bone Region Severe depletion  Dorsal Hand Severe depletion  Patellar Region Severe depletion  Anterior Thigh Region Severe depletion  Posterior Calf Region Severe depletion  Edema (RD Assessment) None  Hair Reviewed  Eyes Reviewed  Mouth Reviewed  Skin Reviewed  Nails Reviewed   Diet Order:   Diet Order             Diet Carb Modified Fluid consistency: Thin; Room service appropriate? Yes  Diet effective now                  EDUCATION NEEDS:   Education needs have been addressed  Skin:  Skin Assessment: Reviewed RN Assessment  Last BM:  pta  Height:   Ht Readings from Last 1 Encounters:  08/08/21 '5\' 8"'  (1.727 m)    Weight:   Wt Readings from Last 1 Encounters:  08/08/21 60.6 kg    Ideal Body Weight:  70 kg  BMI:  Body mass index is 20.31 kg/m.  Estimated Nutritional Needs:   Kcal:  1800-2100kcal/day  Protein:  90-105g/day  Fluid:  1.8-2.1L/day  Koleen Distance MS, RD, LDN Please refer to [facility] for RD and/or RD on-call/weekend/after hours pager

## 2021-08-08 NOTE — ED Notes (Signed)
Pt. Refused invega and lokelma. Pharmacy at bedside discussing with pt. Importance of taking home meds.

## 2021-08-08 NOTE — Evaluation (Signed)
Physical Therapy Evaluation Patient Details Name: BURTON GAHAN MRN: [id number] DOB: 05-26-1961 Today's Date: 08/08/2021  History of Present Illness  Kaleel A Zick is a 60 y.o. male with past medical history of hypertension, hyperlipidemia, CKD, diabetes, seizures, asthma, and schizoaffective disorder who presents to the ED complaining of cough and shortness of breath.   Clinical Impression  Pt admitted with above diagnosis. Pt received supine in bed sleeping with RN present giving meds. Pt resting on 8 L/min HFNC with SPO2 >95% and HR trending in 80's BPM. Pt easily aroused to light touch and voice and able to report PLOF. Unclear of pt's home situation reporting living [facility] called [facility]" but says it is not a group home or ALF. Pt asking for new clothes and if he can get his current clothes he came in with washed so assuming pt does not have a reliable home or living situation based off this information given. Reports he is indep with all ADL's/IADL's and does not use any form of AD and reports not owning any. Overall pt is independent with bed mobility and STS transfers. Is initially unsteady with ambulating in room requiring supervision due to minor bouts of needed stepping strategy but corrects without need for physical assist or use of Ue's. Pt indep with urinating in toilet. Pt able to amb 340' indep with no further LOB with SPO2 remaining >95% with no SOB noted. Pt returned supine in bed with all needs in reach. Pt appears at baseline function. No acute PT needs currently and recommend no PT f/u at discharge. Will d/c PT orders.     Recommendations for follow up therapy are one component of a multi-disciplinary discharge planning process, led by the attending physician.  Recommendations may be updated based on patient status, additional functional criteria and insurance authorization.  Follow Up Recommendations No PT follow up    Assistance Recommended at Discharge None  Functional  Status Assessment Patient has not had a recent decline in their functional status  Equipment Recommendations  None recommended by PT    Recommendations for Other Services       Precautions / Restrictions Precautions Precautions: None Restrictions Weight Bearing Restrictions: No      Mobility  Bed Mobility Overal bed mobility: Independent               Patient Response: Cooperative  Transfers Overall transfer level: Independent                      Ambulation/Gait Ambulation/Gait assistance: Supervision Gait Distance (Feet): 340 Feet Assistive device: None Gait Pattern/deviations: WFL(Within Functional Limits)       General Gait Details: Initially unsteady with standing and ambulating in room with couple instances of stepping strategy. Longer he stands and ambulates he becomes steady with no LOB.  Stairs            Wheelchair Mobility    Modified Rankin (Stroke Patients Only)       Balance Overall balance assessment: Mild deficits observed, not formally tested   Sitting balance-Leahy Scale: Normal Sitting balance - Comments: Dons socks in sitting without difficulty   Standing balance support: No upper extremity supported Standing balance-Leahy Scale: Good                               Pertinent Vitals/Pain Pain Assessment: No/denies pain    Home Living Family/patient expects to be discharged to::  Unsure                   Additional Comments: Pt unclear on his living situation. Called his residence [place] but states it is not any type of assisted living facility or group home    Prior Function Prior Level of Function : Independent/Modified Independent                     Hand Dominance        Extremity/Trunk Assessment   Upper Extremity Assessment Upper Extremity Assessment: Overall WFL for tasks assessed    Lower Extremity Assessment Lower Extremity Assessment: Overall WFL for tasks assessed     Cervical / Trunk Assessment Cervical / Trunk Assessment: Normal  Communication   Communication: No difficulties (Hard to understand)  Cognition Arousal/Alertness: Awake/alert Behavior During Therapy: WFL for tasks assessed/performed Overall Cognitive Status: Difficult to assess                                          General Comments General comments (skin integrity, edema, etc.): SPo2 remains > 95% on HFNC 8L/min with HR trending in 80-89 BPM with mobility    Exercises Other Exercises Other Exercises: Role of PT in acute setting, D/c recs   Assessment/Plan    PT Assessment Patient does not need any further PT services  PT Problem List         PT Treatment Interventions      PT Goals (Current goals can be found in the Care Plan section)  Acute Rehab PT Goals PT Goal Formulation: All assessment and education complete, DC therapy    Frequency     Barriers to discharge        Co-evaluation               AM-PAC PT "6 Clicks" Mobility  Outcome Measure Help needed turning from your back to your side while in a flat bed without using bedrails?: None Help needed moving from lying on your back to sitting on the side of a flat bed without using bedrails?: None Help needed moving to and from a bed to a chair (including a wheelchair)?: None Help needed standing up from a chair using your arms (e.g., wheelchair or bedside chair)?: None Help needed to walk in hospital room?: None Help needed climbing 3-5 steps with a railing? : A Little 6 Click Score: 23    End of Session Equipment Utilized During Treatment: Oxygen Activity Tolerance: Patient tolerated treatment well Patient left: in bed Nurse Communication: Mobility status PT Visit Diagnosis: Unsteadiness on feet (R26.81)    Time: 1610-9604 PT Time Calculation (min) (ACUTE ONLY): 23 min   Charges:   PT Evaluation $PT Eval Low Complexity: 1 Low          Kawan [place] M. Fairly IV, PT,  DPT Physical Therapist- [place]  [facility]  08/08/2021, 9:30 AM

## 2021-08-08 NOTE — Progress Notes (Addendum)
Progress Note    Justin Weeks  MGQ:[id number] DOB: December 22, 1960  DOA: 08/07/2021 PCP: Pcp, No      Brief Narrative:    Medical records reviewed and are as summarized below:  Justin Weeks is a 60 y.o. male with medical history significant for type 2 diabetes mellitus, hypertension, hypothyroidism, arthritis, asthma, seizure disorder, schizoaffective disorder, CKD stage II, tobacco use disorder, who presented to the hospital because of cough and shortness of breath.  His oxygen saturation was 77% on room air in the emergency room.  He was admitted to the hospital for severe sepsis secondary to community-acquired pneumonia/atypical pneumonia complicated by acute hypoxemic respiratory failure.  He also has severe hyperglycemia, AKI and hyperglycemia induced hyponatremia.  He was treated with empiric IV antibiotics, oxygen via nasal cannula, IV fluids and IV insulin infusion.  He had hyperkalemia that was also treated.      Assessment/Plan:   Principal Problem:   Severe sepsis (HCC) Active Problems:   Diabetes (HCC)   Hypothyroidism   Schizoaffective disorder (HCC)   Chronic kidney disease, stage II (mild)   Tobacco dependence syndrome   Essential (primary) hypertension   SOB (shortness of breath)   Hyperglycemia   Atypical pneumonia    Body mass index is 19.01 kg/m.  Sepsis secondary to community-acquired pneumonia/atypical pneumonia, respiratory bronchiolitis on CT chest: Continue empiric IV antibiotics.  Follow-up blood cultures.  Acute hypoxemic respiratory failure: Continue 4 L/min oxygen via nasal cannula and taper off oxygen as able.  Type II DM with severe hyperglycemia: Discontinue steroids.  Start IV fluids and IV insulin infusion and monitor glucose levels per protocol.  Check hemoglobin A1c.  Hyponatremia: Likely hyperglycemia-induced.  Continue IV fluids and monitor BMP.  AKI on CKD stage II with hyperkalemia: Treat with IV fluids and  Lokelma.  Other comorbidities include seizure disorder, schizoaffective disorder, hypertension, hypothyroidism, tobacco use disorder     Diet Order             Diet Carb Modified Fluid consistency: Thin; Room service appropriate? Yes  Diet effective now                      Consultants: None  Procedures: None    Medications:    buPROPion  100 mg Oral q AM   divalproex  500 mg Oral q morning   divalproex  750 mg Oral QHS   docusate sodium  100 mg Oral BID   donepezil  10 mg Oral Daily   enoxaparin (LOVENOX) injection  40 mg Subcutaneous Q24H   ipratropium-albuterol  3 mL Nebulization Q6H   levothyroxine  50 mcg Oral Q0600   nicotine  14 mg Transdermal Daily   paliperidone  12 mg Oral Daily   simvastatin  20 mg Oral QHS   sodium chloride flush  3 mL Intravenous Q12H   Continuous Infusions:  azithromycin Stopped (08/08/21 1220)   cefTRIAXone (ROCEPHIN)  IV Stopped (08/08/21 1129)   dextrose 5% lactated ringers     insulin 12 Units/hr (08/08/21 1318)   lactated ringers 125 mL/hr at 08/08/21 1255     Anti-infectives (From admission, onward)    Start     Dose/Rate Route Frequency Ordered Stop   08/08/21 0845  azithromycin (ZITHROMAX) 500 mg in sodium chloride 0.9 % 250 mL IVPB        500 mg 250 mL/hr over 60 Minutes Intravenous Every 24 hours 08/08/21 0838 08/11/21 0844   08/08/21 0845  cefTRIAXone (ROCEPHIN) 1 g in sodium chloride 0.9 % 100 mL IVPB  Status:  Discontinued        1 g 200 mL/hr over 30 Minutes Intravenous Every 24 hours 08/08/21 0838 08/08/21 0840   08/08/21 0845  cefTRIAXone (ROCEPHIN) 2 g in sodium chloride 0.9 % 100 mL IVPB        2 g 200 mL/hr over 30 Minutes Intravenous Every 24 hours 08/08/21 0840     08/07/21 2330  ceFEPIme (MAXIPIME) 2 g in sodium chloride 0.9 % 100 mL IVPB  Status:  Discontinued        2 g 200 mL/hr over 30 Minutes Intravenous Every 12 hours 08/07/21 2310 08/08/21 0838              Family  Communication/Anticipated D/C date and plan/Code Status   DVT prophylaxis: enoxaparin (LOVENOX) injection 40 mg Start: 08/07/21 2315 SCDs Start: 08/07/21 2311     Code Status: DNR  Family Communication: None Disposition Plan: Possible discharge to home in 2 to 3 days   Status is: Inpatient  Remains inpatient appropriate because: Sepsis, pneumonia           Subjective:   Interval events noted.  He complains of shortness of breath and cough but breathing is a little better today.  Objective:    Vitals:   08/08/21 0605 08/08/21 0830 08/08/21 0941 08/08/21 1300  BP:  111/69 121/65 111/61  Pulse:  65 72 70  Resp:   18   Temp:      TempSrc:      SpO2: 99% 97% 100% 94%  Weight:      Height:       No data found.   Intake/Output Summary (Last 24 hours) at 08/08/2021 1328 Last data filed at 08/08/2021 1220 Gross per 24 hour  Intake 489.92 ml  Output 300 ml  Net 189.92 ml   Filed Weights   08/07/21 2032  Weight: 56.7 kg    Exam:  GEN: NAD SKIN: Warm and dry EYES: EOMI ENT: Dry mucous membranes, edentulous CV: RRR PULM: CTA B ABD: soft, ND, NT, +BS CNS: AAO x 3, non focal, slurred speech likely from edentulous state EXT: No edema or tenderness        Data Reviewed:   I have personally reviewed following labs and imaging studies:  Labs: Labs show the following:   Basic Metabolic Panel: Recent Labs  Lab 08/07/21 2039 08/08/21 0818 08/08/21 1147 08/08/21 1221  NA 131* 129* SPECIMEN HEMOLYZED. HEMOLYSIS MAY AFFECT INTEGRITY OF RESULTS. 127*  K 5.1 5.9* SPECIMEN HEMOLYZED. HEMOLYSIS MAY AFFECT INTEGRITY OF RESULTS. 5.8*  CL 92* 91* SPECIMEN HEMOLYZED. HEMOLYSIS MAY AFFECT INTEGRITY OF RESULTS. 92*  CO2 28 29 SPECIMEN HEMOLYZED. HEMOLYSIS MAY AFFECT INTEGRITY OF RESULTS. 28  GLUCOSE 432* 733* SPECIMEN HEMOLYZED. HEMOLYSIS MAY AFFECT INTEGRITY OF RESULTS. 630*  BUN 33* 31* SPECIMEN HEMOLYZED. HEMOLYSIS MAY AFFECT INTEGRITY OF RESULTS. 29*   CREATININE 1.45* 1.35* SPECIMEN HEMOLYZED. HEMOLYSIS MAY AFFECT INTEGRITY OF RESULTS. 1.21  CALCIUM 9.1 8.6* SPECIMEN HEMOLYZED. HEMOLYSIS MAY AFFECT INTEGRITY OF RESULTS. 8.1*   GFR Estimated Creatinine Clearance: 50.8 mL/min (by C-G formula based on SCr of 1.21 mg/dL). Liver Function Tests: No results for input(s): AST, ALT, ALKPHOS, BILITOT, PROT, ALBUMIN in the last 168 hours. No results for input(s): LIPASE, AMYLASE in the last 168 hours. No results for input(s): AMMONIA in the last 168 hours. Coagulation profile No results for input(s): INR, PROTIME in the last 168 hours.  CBC:  Recent Labs  Lab 08/07/21 2039 08/08/21 0818  WBC 17.8* 17.4*  HGB 12.0* 10.9*  HCT 36.0* 32.7*  MCV 83.9 83.2  PLT 399 388   Cardiac Enzymes: No results for input(s): CKTOTAL, CKMB, CKMBINDEX, TROPONINI in the last 168 hours. BNP (last 3 results) No results for input(s): PROBNP in the last 8760 hours. CBG: Recent Labs  Lab 08/08/21 1054 08/08/21 1252 08/08/21 1316  GLUCAP >600* 574* 586*   D-Dimer: No results for input(s): DDIMER in the last 72 hours. Hgb A1c: No results for input(s): HGBA1C in the last 72 hours. Lipid Profile: No results for input(s): CHOL, HDL, LDLCALC, TRIG, CHOLHDL, LDLDIRECT in the last 72 hours. Thyroid function studies: No results for input(s): TSH, T4TOTAL, T3FREE, THYROIDAB in the last 72 hours.  Invalid input(s): FREET3 Anemia work up: No results for input(s): VITAMINB12, FOLATE, FERRITIN, TIBC, IRON, RETICCTPCT in the last 72 hours. Sepsis Labs: Recent Labs  Lab 08/07/21 2039 08/08/21 0818  WBC 17.8* 17.4*    Microbiology Recent Results (from the past 240 hour(s))  Resp Panel by RT-PCR (Flu A&B, Covid) Nasopharyngeal Swab     Status: None   Collection Time: 08/07/21  9:04 PM   Specimen: Nasopharyngeal Swab; Nasopharyngeal(NP) swabs in vial transport medium  Result Value Ref Range Status   SARS Coronavirus 2 by RT PCR NEGATIVE NEGATIVE Final     Comment: (NOTE) SARS-CoV-2 target nucleic acids are NOT DETECTED.  The SARS-CoV-2 RNA is generally detectable in upper respiratory specimens during the acute phase of infection. The lowest concentration of SARS-CoV-2 viral copies this assay can detect is 138 copies/mL. A negative result does not preclude SARS-Cov-2 infection and should not be used as the sole basis for treatment or other patient management decisions. A negative result may occur with  improper specimen collection/handling, submission of specimen other than nasopharyngeal swab, presence of viral mutation(s) within the areas targeted by this assay, and inadequate number of viral copies(<138 copies/mL). A negative result must be combined with clinical observations, patient history, and epidemiological information. The expected result is Negative.  Fact Sheet for Patients:  EntrepreneurPulse.com.au  Fact Sheet for Healthcare Providers:  IncredibleEmployment.be  This test is no t yet approved or cleared by the [place] FDA and  has been authorized for detection and/or diagnosis of SARS-CoV-2 by FDA under an Emergency Use Authorization (EUA). This EUA will remain  in effect (meaning this test can be used) for the duration of the COVID-19 declaration under Section 564(b)(1) of the Act, 21 U.S.C.section 360bbb-3(b)(1), unless the authorization is terminated  or revoked sooner.       Influenza A by PCR NEGATIVE NEGATIVE Final   Influenza B by PCR NEGATIVE NEGATIVE Final    Comment: (NOTE) The Xpert Xpress SARS-CoV-2/FLU/RSV plus assay is intended as an aid in the diagnosis of influenza from Nasopharyngeal swab specimens and should not be used as a sole basis for treatment. Nasal washings and aspirates are unacceptable for Xpert Xpress SARS-CoV-2/FLU/RSV testing.  Fact Sheet for Patients: EntrepreneurPulse.com.au  Fact Sheet for Healthcare  Providers: IncredibleEmployment.be  This test is not yet approved or cleared by the [place] FDA and has been authorized for detection and/or diagnosis of SARS-CoV-2 by FDA under an Emergency Use Authorization (EUA). This EUA will remain in effect (meaning this test can be used) for the duration of the COVID-19 declaration under Section 564(b)(1) of the Act, 21 U.S.C. section 360bbb-3(b)(1), unless the authorization is terminated or revoked.  Performed at [facility], [place] [place], [place] [address]     Procedures and diagnostic studies:  DG Chest 1 View  Result Date: 08/07/2021 CLINICAL DATA:  Cough and shortness of breath for several days, initial encounter EXAM: CHEST  1 VIEW COMPARISON:  08/29/2016 CT FINDINGS: Cardiac shadow is within normal limits. Mild interstitial changes are noted with mild interstitial edema. No focal infiltrate or sizable effusion is noted. No acute bony abnormality is noted. IMPRESSION: Mild pulmonary edema without focal infiltrate. Electronically Signed   By: Inez Catalina M.D.   On: 08/07/2021 21:03   CT Angio Chest Pulmonary Embolism (PE) W or WO Contrast  Result Date: 08/07/2021 CLINICAL DATA:  Cough short of breath EXAM: CT ANGIOGRAPHY CHEST WITH CONTRAST TECHNIQUE: Multidetector CT imaging of the chest was performed using the standard protocol during bolus administration of intravenous contrast. Multiplanar CT image reconstructions and MIPs were obtained to evaluate the vascular anatomy. CONTRAST:  91mL OMNIPAQUE IOHEXOL 350 MG/ML SOLN COMPARISON:  Chest x-ray 08/07/2021, CT chest 08/29/2016 FINDINGS: Cardiovascular: Satisfactory opacification of the pulmonary arteries to the segmental level. No evidence of pulmonary embolism. Normal heart size. No pericardial effusion. Nonaneurysmal aorta. No dissection is seen. Mediastinum/Nodes: No enlarged mediastinal, hilar, or axillary lymph nodes. Thyroid gland, trachea, and  esophagus demonstrate no significant findings. Lungs/Pleura: No pleural effusion. Stable 6 mm right apical lung nodule, consistent with benign finding. Bronchial wall thickening with fairly widespread ill-defined centrilobular micro nodules. More confluent areas of ground-glass density in the left upper lobe and right lower lobe, and patchy consolidation at the right middle lobe. Upper Abdomen: No acute abnormality. Musculoskeletal: No chest wall abnormality. No acute or significant osseous findings. Review of the MIP images confirms the above findings. IMPRESSION: 1. Negative for acute pulmonary embolus or aortic dissection. 2. Mild diffuse bronchial wall thickening with fairly widespread ill-defined centrilobular micro nodules and areas of ground-glass density, suggestive of respiratory bronchiolitis. Other consideration could include atypical pneumonia to include mycobacterial etiologies. 3. Stable 6 mm right apical pulmonary nodule, felt benign given lack of interval change. Electronically Signed   By: Donavan Foil M.D.   On: 08/07/2021 23:49               LOS: 1 day   Arden Axon  [facility]   Pager on [email or web address]. If 7PM-7AM, please contact night-coverage at [email or web address]     08/08/2021, 1:28 PM

## 2021-08-08 NOTE — Progress Notes (Signed)
OT Cancellation Note  Patient Details Name: Justin Weeks MRN: [id number] DOB: 1960/12/27   Cancelled Treatment:    Reason Eval/Treat Not Completed: OT screened, no needs identified, will sign off. Per PT, pt has demonstrated independence with self care and functional mobility at this time. No acute OT needs.   Jackquline Denmark, MS, OTR/L , CBIS ascom [phone]  08/08/21, 9:12 AM

## 2021-08-08 NOTE — ED Notes (Signed)
Pt pulling off his [place] and his heart monitoring. Informed him that he needs to keep on his oxygen because his O2 level is going down. O2 was 81% on room air placed him back on his O2.

## 2021-08-08 NOTE — ED Notes (Signed)
Insulin drip verified by Diannia Ruder, rn

## 2021-08-08 NOTE — ED Notes (Signed)
Pt tolerating HFNC @10L  and was provided a sandwich box and water.

## 2021-08-08 NOTE — ED Notes (Signed)
Pt up in hallway walking with PT at this time. Tolerating well. Message Hospitalist pts Glucose is 733 this am

## 2021-08-08 NOTE — ED Notes (Signed)
Critical lab BGL 630, insulin drip started. Will continue to monitor.

## 2021-08-08 NOTE — ED Notes (Signed)
Informed RN bed assigned 

## 2021-08-09 DIAGNOSIS — E43 Unspecified severe protein-calorie malnutrition: Secondary | ICD-10-CM | POA: Insufficient documentation

## 2021-08-09 DIAGNOSIS — E1165 Type 2 diabetes mellitus with hyperglycemia: Secondary | ICD-10-CM

## 2021-08-09 LAB — CBC WITH DIFFERENTIAL/PLATELET
Abs Immature Granulocytes: 0.41 10*3/uL — ABNORMAL HIGH (ref 0.00–0.07)
Basophils Absolute: 0.1 10*3/uL (ref 0.0–0.1)
Basophils Relative: 0 %
Eosinophils Absolute: 0 10*3/uL (ref 0.0–0.5)
Eosinophils Relative: 0 %
HCT: 30.9 % — ABNORMAL LOW (ref 39.0–52.0)
Hemoglobin: 10.3 g/dL — ABNORMAL LOW (ref 13.0–17.0)
Immature Granulocytes: 2 %
Lymphocytes Relative: 12 %
Lymphs Abs: 2.1 10*3/uL (ref 0.7–4.0)
MCH: 28.5 pg (ref 26.0–34.0)
MCHC: 33.3 g/dL (ref 30.0–36.0)
MCV: 85.6 fL (ref 80.0–100.0)
Monocytes Absolute: 0.9 10*3/uL (ref 0.1–1.0)
Monocytes Relative: 5 %
Neutro Abs: 14.2 10*3/uL — ABNORMAL HIGH (ref 1.7–7.7)
Neutrophils Relative %: 81 %
Platelets: 376 10*3/uL (ref 150–400)
RBC: 3.61 MIL/uL — ABNORMAL LOW (ref 4.22–5.81)
RDW: 14.5 % (ref 11.5–15.5)
WBC: 17.7 10*3/uL — ABNORMAL HIGH (ref 4.0–10.5)
nRBC: 0.2 % (ref 0.0–0.2)

## 2021-08-09 LAB — GLUCOSE, CAPILLARY
Glucose-Capillary: 151 mg/dL — ABNORMAL HIGH (ref 70–99)
Glucose-Capillary: 154 mg/dL — ABNORMAL HIGH (ref 70–99)
Glucose-Capillary: 159 mg/dL — ABNORMAL HIGH (ref 70–99)
Glucose-Capillary: 189 mg/dL — ABNORMAL HIGH (ref 70–99)
Glucose-Capillary: 225 mg/dL — ABNORMAL HIGH (ref 70–99)
Glucose-Capillary: 232 mg/dL — ABNORMAL HIGH (ref 70–99)
Glucose-Capillary: 255 mg/dL — ABNORMAL HIGH (ref 70–99)
Glucose-Capillary: 281 mg/dL — ABNORMAL HIGH (ref 70–99)
Glucose-Capillary: 335 mg/dL — ABNORMAL HIGH (ref 70–99)
Glucose-Capillary: 75 mg/dL (ref 70–99)
Glucose-Capillary: 96 mg/dL (ref 70–99)

## 2021-08-09 LAB — BASIC METABOLIC PANEL
Anion gap: 3 — ABNORMAL LOW (ref 5–15)
Anion gap: 5 (ref 5–15)
Anion gap: 5 (ref 5–15)
Anion gap: 6 (ref 5–15)
BUN: 24 mg/dL — ABNORMAL HIGH (ref 8–23)
BUN: 24 mg/dL — ABNORMAL HIGH (ref 8–23)
BUN: 27 mg/dL — ABNORMAL HIGH (ref 8–23)
BUN: 30 mg/dL — ABNORMAL HIGH (ref 8–23)
CO2: 32 mmol/L (ref 22–32)
CO2: 33 mmol/L — ABNORMAL HIGH (ref 22–32)
CO2: 34 mmol/L — ABNORMAL HIGH (ref 22–32)
CO2: 34 mmol/L — ABNORMAL HIGH (ref 22–32)
Calcium: 8.4 mg/dL — ABNORMAL LOW (ref 8.9–10.3)
Calcium: 8.4 mg/dL — ABNORMAL LOW (ref 8.9–10.3)
Calcium: 8.5 mg/dL — ABNORMAL LOW (ref 8.9–10.3)
Calcium: 8.7 mg/dL — ABNORMAL LOW (ref 8.9–10.3)
Chloride: 100 mmol/L (ref 98–111)
Chloride: 95 mmol/L — ABNORMAL LOW (ref 98–111)
Chloride: 98 mmol/L (ref 98–111)
Chloride: 98 mmol/L (ref 98–111)
Creatinine, Ser: 0.99 mg/dL (ref 0.61–1.24)
Creatinine, Ser: 1.1 mg/dL (ref 0.61–1.24)
Creatinine, Ser: 1.12 mg/dL (ref 0.61–1.24)
Creatinine, Ser: 1.3 mg/dL — ABNORMAL HIGH (ref 0.61–1.24)
GFR, Estimated: >60 mL/min (ref >60)
GFR, Estimated: >60 mL/min (ref >60)
GFR, Estimated: >60 mL/min (ref >60)
GFR, Estimated: >60 mL/min (ref >60)
Glucose, Bld: 185 mg/dL — ABNORMAL HIGH (ref 70–99)
Glucose, Bld: 257 mg/dL — ABNORMAL HIGH (ref 70–99)
Glucose, Bld: 257 mg/dL — ABNORMAL HIGH (ref 70–99)
Glucose, Bld: 89 mg/dL (ref 70–99)
Potassium: 4.5 mmol/L (ref 3.5–5.1)
Potassium: 4.7 mmol/L (ref 3.5–5.1)
Potassium: 4.7 mmol/L (ref 3.5–5.1)
Potassium: 4.8 mmol/L (ref 3.5–5.1)
Sodium: 134 mmol/L — ABNORMAL LOW (ref 135–145)
Sodium: 135 mmol/L (ref 135–145)
Sodium: 137 mmol/L (ref 135–145)
Sodium: 137 mmol/L (ref 135–145)

## 2021-08-09 LAB — HEMOGLOBIN A1C
Hgb A1c MFr Bld: 9.7 % — ABNORMAL HIGH (ref 4.8–5.6)
Mean Plasma Glucose: 232 mg/dL

## 2021-08-09 MED ORDER — INSULIN ASPART 100 UNIT/ML IJ SOLN
0.0000 [IU] | Freq: Three times a day (TID) | INTRAMUSCULAR | Status: DC
  Administered 2021-08-09: 7 [IU] via SUBCUTANEOUS
  Administered 2021-08-09: 5 [IU] via SUBCUTANEOUS
  Administered 2021-08-09: 2 [IU] via SUBCUTANEOUS
  Administered 2021-08-10: 5 [IU] via SUBCUTANEOUS
  Filled 2021-08-09 (×4): qty 1

## 2021-08-09 MED ORDER — LACTATED RINGERS IV SOLN: INTRAVENOUS | Status: DC

## 2021-08-09 MED ORDER — INSULIN GLARGINE-YFGN 100 UNIT/ML ~~LOC~~ SOLN
10.0000 [IU] | Freq: Every day | SUBCUTANEOUS | Status: DC
  Administered 2021-08-09: 10 [IU] via SUBCUTANEOUS
  Filled 2021-08-09 (×2): qty 0.1

## 2021-08-09 NOTE — TOC Progression Note (Signed)
Transition of Care [facility]) - Progression Note    Patient Details  Name: Justin Weeks MRN: [id number] Date of Birth: Apr 12, 1961  [facility]) CM/SW Contact  Allayne Butcher, RN Phone Number: 08/09/2021, 3:57 PM  Clinical Narrative:    Referral form faxed to [facility] for PCP services.     Expected Discharge Plan: Home/Self Care Barriers to Discharge: Continued Medical Work up  Expected Discharge Plan and Services Expected Discharge Plan: Home/Self Care   Discharge Planning Services: CM Consult, Follow-up appt scheduled   Living arrangements for the past 2 months: Apartment                 DME Arranged: N/A DME Agency: NA       HH Arranged: NA           Social Determinants of Health (SDOH) Interventions    Readmission Risk Interventions Readmission Risk Prevention Plan 08/09/2021  Transportation Screening Complete  PCP or Specialist Appt within 3-5 Days Complete  HRI or Home Care Consult Complete  Social Work Consult for Recovery Care Planning/Counseling Complete  Palliative Care Screening Not Applicable  Medication Review Oceanographer) Complete  Some recent data might be hidden

## 2021-08-09 NOTE — TOC Initial Note (Signed)
Transition of Care [facility]) - Initial/Assessment Note    Patient Details  Name: Justin Weeks MRN: [id number] Date of Birth: 1960/12/18  Transition of Care [facility]) CM/SW Contact:    Shelbie Hutching, RN Phone Number: 08/09/2021, 11:30 AM  Clinical Narrative:                 Patient admitted to the hospital with COPD exacerbation currently requiring HFNC at 8L.  Patient does not have chronic O2 at home.  RNCM met with patient at the bedside, patient seems angry and responds shortly.  He lives alone in an apartment.  He reports he has no family and no friends.  He does not drive and he does not have a PCP.   Patient agrees with being set up with PCP at discharge and is interested in getting help with transportation.  Informed patient that he needs to call his [facility] and Medicaid to discuss transportation options, he reports he doesn't know how to do this. RNCM will send a referral to [facility] for PCP services at home and they also have case management services - they may be able to follow him and assist with his social issues.    Expected Discharge Plan: Home/Self Care Barriers to Discharge: Continued Medical Work up   Patient Goals and CMS Choice Patient states their goals for this hospitalization and ongoing recovery are:: Patient wants to know who is going to provide transporation for him when he gets home      Expected Discharge Plan and Services Expected Discharge Plan: Home/Self Care   Discharge Planning Services: CM Consult, Follow-up appt scheduled   Living arrangements for the past 2 months: Apartment                 DME Arranged: N/A DME Agency: NA       HH Arranged: NA          Prior Living Arrangements/Services Living arrangements for the past 2 months: Apartment Lives with:: Self Patient language and need for interpreter reviewed:: Yes Do you feel safe going back to the place where you live?: Yes      Need for Family Participation in Patient Care: Yes  (Comment) Care giver support system in place?: No (comment)   Criminal Activity/Legal Involvement Pertinent to Current Situation/Hospitalization: No - Comment as needed  Activities of Daily Living Home Assistive Devices/Equipment: None ADL Screening (condition at time of admission) Patient's cognitive ability adequate to safely complete daily activities?: Yes Is the patient deaf or have difficulty hearing?: Yes Does the patient have difficulty seeing, even when wearing glasses/contacts?: No Does the patient have difficulty concentrating, remembering, or making decisions?: Yes Patient able to express need for assistance with ADLs?: Yes Does the patient have difficulty dressing or bathing?: No Independently performs ADLs?: Yes (appropriate for developmental age) Does the patient have difficulty walking or climbing stairs?: No Weakness of Legs: None Weakness of Arms/Hands: None  Permission Sought/Granted Permission sought to share information with : Case Manager, PCP Permission granted to share information with : Yes, Verbal Permission Granted     Permission granted to share info w AGENCY: set him up with PCP        Emotional Assessment Appearance:: Appears older than stated age Attitude/Demeanor/Rapport: Aggressive (Verbally and/or physically), Angry, Hostile, Avoidant Affect (typically observed): Angry Orientation: : Oriented to Self, Oriented to Place, Oriented to  Time, Oriented to Situation Alcohol / Substance Use: Not Applicable Psych Involvement: No (comment)  Admission diagnosis:  SOB (  shortness of breath) [R06.02] Hyperglycemia [R73.9] COPD exacerbation (HCC) [J44.1] Acute respiratory failure with hypoxia ([place]) [J96.01] Patient Active Problem List   Diagnosis Date Noted   Protein-calorie malnutrition, severe 08/09/2021   Hyperglycemia 08/08/2021   Severe sepsis ([place]) 08/08/2021   Atypical pneumonia 08/08/2021   SOB (shortness of breath) 08/07/2021   Psychosis ([place])  02/21/2020   Schizoaffective disorder ([place]) 10/17/2019   Suicidal ideation    Memory difficulty 01/03/2019   Schizoaffective disorder, bipolar type ([place]) 07/04/2015   Dyslipidemia 07/04/2015   Tobacco dependence syndrome 07/04/2015   Noncompliance 07/03/2015   Hypothyroidism 07/03/2015   Diabetes ([place]) 04/20/2015   Chronic kidney disease, stage II (mild) 09/12/2013   Hemorrhoids 12/03/2007   Hyperlipidemia, unspecified 11/16/2007   Type 2 diabetes mellitus without complications ([place]) 63/33/5456   Essential (primary) hypertension 11/16/2007   PCP:  Merryl Hacker, No Pharmacy:   [facility] [address] [place] [id number] Phone: [phone] Fax: [phone]     Social Determinants of Health (SDOH) Interventions    Readmission Risk Interventions Readmission Risk Prevention Plan 08/09/2021  Transportation Screening Complete  PCP or Specialist Appt within 3-5 Days Complete  HRI or [place] Complete  Social Work Consult for [place] Planning/Counseling Complete  Palliative Care Screening Not Applicable  Medication Review Press photographer) Complete  Some recent data might be hidden

## 2021-08-09 NOTE — Progress Notes (Signed)
Inpatient Diabetes Program Recommendations  AACE/ADA: New Consensus Statement on Inpatient Glycemic Control (2015)  Target Ranges:  Prepandial:   less than 140 mg/dL      Peak postprandial:   less than 180 mg/dL (1-2 hours)      Critically ill patients:  140 - 180 mg/dL   Lab Results  Component Value Date   GLUCAP 75 08/09/2021   HGBA1C 9.7 (H) 08/08/2021    Review of Glycemic Control  Diabetes history: DM2 Outpatient Diabetes medications: Glucotrol 10 mg bid, Januvia 100 mg qd, Metformin 1 gm bid Current orders for Inpatient glycemic control: Semglee 10 units qd, Novolog correction 0-9 qid  Inpatient Diabetes Program Recommendations:    Received consult regarding diabetes management. Agree with treatment as planned.  Thank you, Justin Weeks. Antoinette Borgwardt, RN, MSN, CDE  Diabetes Coordinator Inpatient Glycemic Control Team Team Pager [phone] (8am-5pm) 08/09/2021 9:19 AM

## 2021-08-09 NOTE — Progress Notes (Signed)
Progress Note    JACUB WAITERS  HUT:[id number] DOB: [birth year]  DOA: 08/07/2021 PCP: Pcp, No      Brief Narrative:    Medical records reviewed and are as summarized below:  ALMIN LIVINGSTONE is a 61 y.o. male with medical history significant for type 2 diabetes mellitus, hypertension, hypothyroidism, arthritis, asthma, seizure disorder, schizoaffective disorder, CKD stage II, tobacco use disorder, who presented to the hospital because of cough and shortness of breath.  His oxygen saturation was 77% on room air in the emergency room.  He was admitted to the hospital for severe sepsis secondary to community-acquired pneumonia/atypical pneumonia complicated by acute hypoxemic respiratory failure.  He also has severe hyperglycemia, AKI and hyperglycemia induced hyponatremia.  He was treated with empiric IV antibiotics, oxygen via nasal cannula, IV fluids and IV insulin infusion.  He had hyperkalemia that was also treated.      Assessment/Plan:   Principal Problem:   Severe sepsis (HCC) Active Problems:   Diabetes (HCC)   Hypothyroidism   Schizoaffective disorder (HCC)   Chronic kidney disease, stage II (mild)   Tobacco dependence syndrome   Essential (primary) hypertension   SOB (shortness of breath)   Hyperglycemia   Atypical pneumonia   Protein-calorie malnutrition, severe    Body mass index is 20.31 kg/m.  Sepsis secondary to community-acquired pneumonia/atypical pneumonia, respiratory bronchiolitis on CT chest: Continue empiric IV antibiotics.  Acute hypoxemic respiratory failure: Oxygen requirement has gone up to 8 to 10 L/min via HFNC.  Taper down oxygen as able.  CT of the chest was negative for pulmonary embolism.  Type II DM with severe hyperglycemia: Glucose levels have improved.  He is off of insulin infusion.  Continue insulin glargine and NovoLog as needed for hyperglycemia.  Hemoglobin A1c was 9.7.  Hyponatremia: Improved  AKI on CKD stage II with  hyperkalemia: Improved.    Other comorbidities include seizure disorder, schizoaffective disorder, hypertension, hypothyroidism, tobacco use disorder     Diet Order             Diet heart healthy/carb modified Room service appropriate? Yes; Fluid consistency: Thin  Diet effective now                      Consultants: None  Procedures: None    Medications:    buPROPion  100 mg Oral q AM   Chlorhexidine Gluconate Cloth  6 each Topical Daily   divalproex  500 mg Oral q morning   divalproex  750 mg Oral QHS   docusate sodium  100 mg Oral BID   donepezil  10 mg Oral Daily   enoxaparin (LOVENOX) injection  40 mg Subcutaneous Q24H   feeding supplement (NEPRO CARB STEADY)  237 mL Oral TID BM   influenza vac split quadrivalent PF  0.5 mL Intramuscular Once   insulin aspart  0-9 Units Subcutaneous TID AC & HS   insulin glargine-yfgn  10 Units Subcutaneous Daily   ipratropium-albuterol  3 mL Nebulization Q6H   levothyroxine  50 mcg Oral Q0600   mouth rinse  15 mL Mouth Rinse BID   multivitamin with minerals  1 tablet Oral Daily   nicotine  14 mg Transdermal Daily   paliperidone  12 mg Oral Daily   simvastatin  20 mg Oral QHS   sodium chloride flush  3 mL Intravenous Q12H   Continuous Infusions:  azithromycin Stopped (08/09/21 1018)   cefTRIAXone (ROCEPHIN)  IV Stopped (08/09/21 0904)  lactated ringers 125 mL/hr at 08/09/21 1435     Anti-infectives (From admission, onward)    Start     Dose/Rate Route Frequency Ordered Stop   08/08/21 0845  azithromycin (ZITHROMAX) 500 mg in sodium chloride 0.9 % 250 mL IVPB        500 mg 250 mL/hr over 60 Minutes Intravenous Every 24 hours 08/08/21 0838 08/11/21 0844   08/08/21 0845  cefTRIAXone (ROCEPHIN) 1 g in sodium chloride 0.9 % 100 mL IVPB  Status:  Discontinued        1 g 200 mL/hr over 30 Minutes Intravenous Every 24 hours 08/08/21 0838 08/08/21 0840   08/08/21 0845  cefTRIAXone (ROCEPHIN) 2 g in sodium chloride 0.9  % 100 mL IVPB        2 g 200 mL/hr over 30 Minutes Intravenous Every 24 hours 08/08/21 0840     08/07/21 2330  ceFEPIme (MAXIPIME) 2 g in sodium chloride 0.9 % 100 mL IVPB  Status:  Discontinued        2 g 200 mL/hr over 30 Minutes Intravenous Every 12 hours 08/07/21 2310 08/08/21 0838              Family Communication/Anticipated D/C date and plan/Code Status   DVT prophylaxis: enoxaparin (LOVENOX) injection 40 mg Start: 08/07/21 2315 SCDs Start: 08/07/21 2311     Code Status: DNR  Family Communication: None Disposition Plan: Possible discharge to home in 2 to 3 days   Status is: Inpatient  Remains inpatient appropriate because: Sepsis, pneumonia           Subjective:   C/o cough and difficulty breathing with minimal exertion  Objective:    Vitals:   08/09/21 1100 08/09/21 1200 08/09/21 1300 08/09/21 1400  BP: 107/65 (!) 98/51 134/64 (!) 103/52  Pulse: 81 75 83 71  Resp:      Temp:   98 F (36.7 C)   TempSrc:   Oral   SpO2: 96% 96% (!) 89% 97%  Weight:      Height:       No data found.   Intake/Output Summary (Last 24 hours) at 08/09/2021 1508 Last data filed at 08/09/2021 1437 Gross per 24 hour  Intake 3698.1 ml  Output 2950 ml  Net 748.1 ml   Filed Weights   08/07/21 2032 08/08/21 1451  Weight: 56.7 kg 60.6 kg    Exam:  GEN: NAD SKIN: Warm and dry EYES: EOMI ENT: MMM CV: RRR PULM: Decreased air entry bilaterally.  Bilateral rhonchi.  No rales heard ABD: soft, ND, NT, +BS CNS: AAO x 3, non focal EXT: No edema or tenderness        Data Reviewed:   I have personally reviewed following labs and imaging studies:  Labs: Labs show the following:   Basic Metabolic Panel: Recent Labs  Lab 08/08/21 1941 08/09/21 0001 08/09/21 0421 08/09/21 0751 08/09/21 1206  NA 136 134* 137 137 135  K 4.8 4.7 4.7 4.5 4.8  CL 96* 95* 98 100 98  CO2 30 34* 33* 32 34*  GLUCOSE 226* 257* 185* 89 257*  BUN 27* 30* 27* 24* 24*   CREATININE 1.30* 1.30* 1.12 0.99 1.10  CALCIUM 8.8* 8.5* 8.7* 8.4* 8.4*   GFR Estimated Creatinine Clearance: 59.7 mL/min (by C-G formula based on SCr of 1.1 mg/dL). Liver Function Tests: No results for input(s): AST, ALT, ALKPHOS, BILITOT, PROT, ALBUMIN in the last 168 hours. No results for input(s): LIPASE, AMYLASE in the last 168 hours.  No results for input(s): AMMONIA in the last 168 hours. Coagulation profile No results for input(s): INR, PROTIME in the last 168 hours.  CBC: Recent Labs  Lab 08/07/21 2039 08/08/21 0818 08/09/21 0421  WBC 17.8* 17.4* 17.7*  NEUTROABS  --   --  14.2*  HGB 12.0* 10.9* 10.3*  HCT 36.0* 32.7* 30.9*  MCV 83.9 83.2 85.6  PLT 399 388 376   Cardiac Enzymes: No results for input(s): CKTOTAL, CKMB, CKMBINDEX, TROPONINI in the last 168 hours. BNP (last 3 results) No results for input(s): PROBNP in the last 8760 hours. CBG: Recent Labs  Lab 08/09/21 0347 08/09/21 0519 08/09/21 0725 08/09/21 0831 08/09/21 1107  GLUCAP 159* 154* 96 75 189*   D-Dimer: No results for input(s): DDIMER in the last 72 hours. Hgb A1c: Recent Labs    08/08/21 0818  HGBA1C 9.7*   Lipid Profile: No results for input(s): CHOL, HDL, LDLCALC, TRIG, CHOLHDL, LDLDIRECT in the last 72 hours. Thyroid function studies: No results for input(s): TSH, T4TOTAL, T3FREE, THYROIDAB in the last 72 hours.  Invalid input(s): FREET3 Anemia work up: No results for input(s): VITAMINB12, FOLATE, FERRITIN, TIBC, IRON, RETICCTPCT in the last 72 hours. Sepsis Labs: Recent Labs  Lab 08/07/21 2039 08/08/21 0818 08/09/21 0421  WBC 17.8* 17.4* 17.7*    Microbiology Recent Results (from the past 240 hour(s))  Resp Panel by RT-PCR (Flu A&B, Covid) Nasopharyngeal Swab     Status: None   Collection Time: 08/07/21  9:04 PM   Specimen: Nasopharyngeal Swab; Nasopharyngeal(NP) swabs in vial transport medium  Result Value Ref Range Status   SARS Coronavirus 2 by RT PCR NEGATIVE  NEGATIVE Final    Comment: (NOTE) SARS-CoV-2 target nucleic acids are NOT DETECTED.  The SARS-CoV-2 RNA is generally detectable in upper respiratory specimens during the acute phase of infection. The lowest concentration of SARS-CoV-2 viral copies this assay can detect is 138 copies/mL. A negative result does not preclude SARS-Cov-2 infection and should not be used as the sole basis for treatment or other patient management decisions. A negative result may occur with  improper specimen collection/handling, submission of specimen other than nasopharyngeal swab, presence of viral mutation(s) within the areas targeted by this assay, and inadequate number of viral copies(<138 copies/mL). A negative result must be combined with clinical observations, patient history, and epidemiological information. The expected result is Negative.  Fact Sheet for Patients:  EntrepreneurPulse.com.au  Fact Sheet for Healthcare Providers:  IncredibleEmployment.be  This test is no t yet approved or cleared by the [place] FDA and  has been authorized for detection and/or diagnosis of SARS-CoV-2 by FDA under an Emergency Use Authorization (EUA). This EUA will remain  in effect (meaning this test can be used) for the duration of the COVID-19 declaration under Section 564(b)(1) of the Act, 21 U.S.C.section 360bbb-3(b)(1), unless the authorization is terminated  or revoked sooner.       Influenza A by PCR NEGATIVE NEGATIVE Final   Influenza B by PCR NEGATIVE NEGATIVE Final    Comment: (NOTE) The Xpert Xpress SARS-CoV-2/FLU/RSV plus assay is intended as an aid in the diagnosis of influenza from Nasopharyngeal swab specimens and should not be used as a sole basis for treatment. Nasal washings and aspirates are unacceptable for Xpert Xpress SARS-CoV-2/FLU/RSV testing.  Fact Sheet for Patients: EntrepreneurPulse.com.au  Fact Sheet for Healthcare  Providers: IncredibleEmployment.be  This test is not yet approved or cleared by the [place] FDA and has been authorized for detection and/or diagnosis of SARS-CoV-2  by FDA under an Emergency Use Authorization (EUA). This EUA will remain in effect (meaning this test can be used) for the duration of the COVID-19 declaration under Section 564(b)(1) of the Act, 21 U.S.C. section 360bbb-3(b)(1), unless the authorization is terminated or revoked.  Performed at [facility], [address] [place], [place] [address]   MRSA Next Gen by PCR, Nasal     Status: None   Collection Time: 08/08/21  2:55 PM   Specimen: Nasal Mucosa; Nasal Swab  Result Value Ref Range Status   MRSA by PCR Next Gen NOT DETECTED NOT DETECTED Final    Comment: (NOTE) The GeneXpert MRSA Assay (FDA approved for NASAL specimens only), is one component of a comprehensive MRSA colonization surveillance program. It is not intended to diagnose MRSA infection nor to guide or monitor treatment for MRSA infections. Test performance is not FDA approved in patients less than 29 years old. Performed at [facility], [address] [place], [place] [address]     Procedures and diagnostic studies:  DG Chest 1 View  Result Date: 08/07/2021 CLINICAL DATA:  Cough and shortness of breath for several days, initial encounter EXAM: CHEST  1 VIEW COMPARISON:  08/29/2016 CT FINDINGS: Cardiac shadow is within normal limits. Mild interstitial changes are noted with mild interstitial edema. No focal infiltrate or sizable effusion is noted. No acute bony abnormality is noted. IMPRESSION: Mild pulmonary edema without focal infiltrate. Electronically Signed   By: Inez Catalina M.D.   On: 08/07/2021 21:03   CT Angio Chest Pulmonary Embolism (PE) W or WO Contrast  Result Date: 08/07/2021 CLINICAL DATA:  Cough short of breath EXAM: CT ANGIOGRAPHY CHEST WITH CONTRAST TECHNIQUE: Multidetector CT imaging  of the chest was performed using the standard protocol during bolus administration of intravenous contrast. Multiplanar CT image reconstructions and MIPs were obtained to evaluate the vascular anatomy. CONTRAST:  23mL OMNIPAQUE IOHEXOL 350 MG/ML SOLN COMPARISON:  Chest x-ray 08/07/2021, CT chest 08/29/2016 FINDINGS: Cardiovascular: Satisfactory opacification of the pulmonary arteries to the segmental level. No evidence of pulmonary embolism. Normal heart size. No pericardial effusion. Nonaneurysmal aorta. No dissection is seen. Mediastinum/Nodes: No enlarged mediastinal, hilar, or axillary lymph nodes. Thyroid gland, trachea, and esophagus demonstrate no significant findings. Lungs/Pleura: No pleural effusion. Stable 6 mm right apical lung nodule, consistent with benign finding. Bronchial wall thickening with fairly widespread ill-defined centrilobular micro nodules. More confluent areas of ground-glass density in the left upper lobe and right lower lobe, and patchy consolidation at the right middle lobe. Upper Abdomen: No acute abnormality. Musculoskeletal: No chest wall abnormality. No acute or significant osseous findings. Review of the MIP images confirms the above findings. IMPRESSION: 1. Negative for acute pulmonary embolus or aortic dissection. 2. Mild diffuse bronchial wall thickening with fairly widespread ill-defined centrilobular micro nodules and areas of ground-glass density, suggestive of respiratory bronchiolitis. Other consideration could include atypical pneumonia to include mycobacterial etiologies. 3. Stable 6 mm right apical pulmonary nodule, felt benign given lack of interval change. Electronically Signed   By: Donavan Foil M.D.   On: 08/07/2021 23:49               LOS: 2 days   Jaziya Obarr  [facility]   Pager on [email or web address]. If 7PM-7AM, please contact night-coverage at [email or web address]     08/09/2021, 3:08 PM

## 2021-08-10 LAB — CBC WITH DIFFERENTIAL/PLATELET
Abs Immature Granulocytes: 0.49 10*3/uL — ABNORMAL HIGH (ref 0.00–0.07)
Basophils Absolute: 0 10*3/uL (ref 0.0–0.1)
Basophils Relative: 0 %
Eosinophils Absolute: 0.1 10*3/uL (ref 0.0–0.5)
Eosinophils Relative: 0 %
HCT: 29.1 % — ABNORMAL LOW (ref 39.0–52.0)
Hemoglobin: 10 g/dL — ABNORMAL LOW (ref 13.0–17.0)
Immature Granulocytes: 4 %
Lymphocytes Relative: 10 %
Lymphs Abs: 1.4 10*3/uL (ref 0.7–4.0)
MCH: 28.7 pg (ref 26.0–34.0)
MCHC: 34.4 g/dL (ref 30.0–36.0)
MCV: 83.6 fL (ref 80.0–100.0)
Monocytes Absolute: 0.9 10*3/uL (ref 0.1–1.0)
Monocytes Relative: 6 %
Neutro Abs: 11.3 10*3/uL — ABNORMAL HIGH (ref 1.7–7.7)
Neutrophils Relative %: 80 %
Platelets: 352 10*3/uL (ref 150–400)
RBC: 3.48 MIL/uL — ABNORMAL LOW (ref 4.22–5.81)
RDW: 14.6 % (ref 11.5–15.5)
Smear Review: NORMAL
WBC: 14.1 10*3/uL — ABNORMAL HIGH (ref 4.0–10.5)
nRBC: 0.1 % (ref 0.0–0.2)

## 2021-08-10 LAB — BASIC METABOLIC PANEL
Anion gap: 6 (ref 5–15)
BUN: 20 mg/dL (ref 8–23)
CO2: 35 mmol/L — ABNORMAL HIGH (ref 22–32)
Calcium: 8.8 mg/dL — ABNORMAL LOW (ref 8.9–10.3)
Chloride: 97 mmol/L — ABNORMAL LOW (ref 98–111)
Creatinine, Ser: 0.94 mg/dL (ref 0.61–1.24)
GFR, Estimated: >60 mL/min (ref >60)
Glucose, Bld: 289 mg/dL — ABNORMAL HIGH (ref 70–99)
Potassium: 4.6 mmol/L (ref 3.5–5.1)
Sodium: 138 mmol/L (ref 135–145)

## 2021-08-10 LAB — GLUCOSE, CAPILLARY
Glucose-Capillary: 270 mg/dL — ABNORMAL HIGH (ref 70–99)
Glucose-Capillary: 250 mg/dL — ABNORMAL HIGH (ref 70–99)
Glucose-Capillary: 353 mg/dL — ABNORMAL HIGH (ref 70–99)
Glucose-Capillary: 198 mg/dL — ABNORMAL HIGH (ref 70–99)

## 2021-08-10 MED ORDER — INSULIN GLARGINE-YFGN 100 UNIT/ML ~~LOC~~ SOLN
25.0000 [IU] | Freq: Every day | SUBCUTANEOUS | Status: DC
  Administered 2021-08-10: 25 [IU] via SUBCUTANEOUS
  Filled 2021-08-10 (×2): qty 0.25

## 2021-08-10 MED ORDER — PREDNISONE 20 MG PO TABS
30.0000 mg | ORAL_TABLET | Freq: Every day | ORAL | Status: DC
  Administered 2021-08-10: 30 mg via ORAL
  Filled 2021-08-10: qty 1

## 2021-08-10 MED ORDER — INSULIN ASPART 100 UNIT/ML IJ SOLN: 0.0000 [IU] | Freq: Every day | INTRAMUSCULAR | Status: DC

## 2021-08-10 MED ORDER — INSULIN ASPART 100 UNIT/ML IJ SOLN
0.0000 [IU] | Freq: Three times a day (TID) | INTRAMUSCULAR | Status: DC
  Administered 2021-08-10: 20 [IU] via SUBCUTANEOUS
  Administered 2021-08-10: 7 [IU] via SUBCUTANEOUS
  Administered 2021-08-11: 11 [IU] via SUBCUTANEOUS
  Filled 2021-08-10 (×3): qty 1

## 2021-08-10 NOTE — Progress Notes (Signed)
Progress Note    Justin Weeks  BDZ:[id number] DOB: 09-04-1960  DOA: 08/07/2021 PCP: Pcp, No      Brief Narrative:    Medical records reviewed and are as summarized below:  Justin Weeks is a 60 y.o. male with medical history significant for type 2 diabetes mellitus, hypertension, hypothyroidism, arthritis, asthma, seizure disorder, schizoaffective disorder, CKD stage II, tobacco use disorder, who presented to the hospital because of cough and shortness of breath.  His oxygen saturation was 77% on room air in the emergency room.  He was admitted to the hospital for severe sepsis secondary to community-acquired pneumonia/atypical pneumonia complicated by acute hypoxemic respiratory failure.  He also has severe hyperglycemia, AKI and hyperglycemia induced hyponatremia.  He was treated with empiric IV antibiotics, oxygen via nasal cannula, IV fluids and IV insulin infusion.  He had hyperkalemia that was also treated.      Assessment/Plan:   Principal Problem:   Severe sepsis (HCC) Active Problems:   Diabetes (HCC)   Hypothyroidism   Schizoaffective disorder (HCC)   Chronic kidney disease, stage II (mild)   Tobacco dependence syndrome   Essential (primary) hypertension   SOB (shortness of breath)   Hyperglycemia   Atypical pneumonia   Protein-calorie malnutrition, severe    Body mass index is 20.31 kg/m.  Sepsis secondary to community-acquired pneumonia/atypical pneumonia, respiratory bronchiolitis on CT chest: Continue empiric IV Rocephin.  Completed 3 doses of IV azithromycin.  Add prednisone because of diffuse expiratory wheezing.  Acute hypoxemic respiratory failure: Improving.  He is on 2 L/min oxygen via nasal cannula.  Taper off oxygen as able.     Type II DM with severe hyperglycemia: Initially treated with insulin drip.  Increase insulin glargine from 10 units to 25 units daily.  Continue NovoLog as needed for hyperglycemia.    Hemoglobin A1c was  9.7.  Hyponatremia: Improved  AKI on CKD stage II with hyperkalemia: Improved.    Other comorbidities include seizure disorder, schizoaffective disorder, hypertension, hypothyroidism, tobacco use disorder  Transfer from stepdown unit to MedSurg. Plan discussed with Clydie Braun, RN, at the bedside.   Diet Order             Diet heart healthy/carb modified Room service appropriate? Yes; Fluid consistency: Thin  Diet effective now                      Consultants: None  Procedures: None    Medications:    buPROPion  100 mg Oral q AM   Chlorhexidine Gluconate Cloth  6 each Topical Daily   divalproex  500 mg Oral q morning   divalproex  750 mg Oral QHS   docusate sodium  100 mg Oral BID   donepezil  10 mg Oral Daily   enoxaparin (LOVENOX) injection  40 mg Subcutaneous Q24H   feeding supplement (NEPRO CARB STEADY)  237 mL Oral TID BM   insulin aspart  0-20 Units Subcutaneous TID WC   insulin aspart  0-5 Units Subcutaneous QHS   insulin glargine-yfgn  25 Units Subcutaneous Daily   ipratropium-albuterol  3 mL Nebulization Q6H   levothyroxine  50 mcg Oral Q0600   mouth rinse  15 mL Mouth Rinse BID   multivitamin with minerals  1 tablet Oral Daily   nicotine  14 mg Transdermal Daily   paliperidone  12 mg Oral Daily   predniSONE  30 mg Oral Q breakfast   simvastatin  20 mg Oral QHS  sodium chloride flush  3 mL Intravenous Q12H   Continuous Infusions:  cefTRIAXone (ROCEPHIN)  IV 2 g (08/10/21 0758)     Anti-infectives (From admission, onward)    Start     Dose/Rate Route Frequency Ordered Stop   08/08/21 0845  azithromycin (ZITHROMAX) 500 mg in sodium chloride 0.9 % 250 mL IVPB        500 mg 250 mL/hr over 60 Minutes Intravenous Every 24 hours 08/08/21 0838 08/10/21 0951   08/08/21 0845  cefTRIAXone (ROCEPHIN) 1 g in sodium chloride 0.9 % 100 mL IVPB  Status:  Discontinued        1 g 200 mL/hr over 30 Minutes Intravenous Every 24 hours 08/08/21 0838 08/08/21 0840    08/08/21 0845  cefTRIAXone (ROCEPHIN) 2 g in sodium chloride 0.9 % 100 mL IVPB        2 g 200 mL/hr over 30 Minutes Intravenous Every 24 hours 08/08/21 0840     08/07/21 2330  ceFEPIme (MAXIPIME) 2 g in sodium chloride 0.9 % 100 mL IVPB  Status:  Discontinued        2 g 200 mL/hr over 30 Minutes Intravenous Every 12 hours 08/07/21 2310 08/08/21 0838              Family Communication/Anticipated D/C date and plan/Code Status   DVT prophylaxis: enoxaparin (LOVENOX) injection 40 mg Start: 08/07/21 2315 SCDs Start: 08/07/21 2311     Code Status: DNR  Family Communication: None Disposition Plan: Possible discharge to home in 2 to 3 days   Status is: Inpatient  Remains inpatient appropriate because: Sepsis, pneumonia           Subjective:   Interval events noted.  He still has a cough.  Breathing is a little better.  Objective:    Vitals:   08/10/21 0600 08/10/21 0605 08/10/21 0800 08/10/21 0900  BP:  139/67 (!) 147/56 (!) 125/55  Pulse: 70 99 (!) 35 72  Resp: 17 (!) 25    Temp:   99 F (37.2 C)   TempSrc:   Oral   SpO2: 91% 92% 91% 95%  Weight:      Height:       No data found.   Intake/Output Summary (Last 24 hours) at 08/10/2021 1055 Last data filed at 08/10/2021 1000 Gross per 24 hour  Intake 1172.38 ml  Output 2925 ml  Net -1752.62 ml   Filed Weights   08/07/21 2032 08/08/21 1451  Weight: 56.7 kg 60.6 kg    Exam:  GEN: NAD SKIN: Warm and dry EYES: No pallor or icterus ENT: MMM, edentulous CV: RRR PULM: Decreased air entry bilaterally, bilateral expiratory wheezing.  No rales at ABD: soft, ND, NT, +BS CNS: AAO x 3, non focal EXT: No edema or tenderness         Data Reviewed:   I have personally reviewed following labs and imaging studies:  Labs: Labs show the following:   Basic Metabolic Panel: Recent Labs  Lab 08/09/21 0001 08/09/21 0421 08/09/21 0751 08/09/21 1206 08/10/21 0525  NA 134* 137 137 135 138  K  4.7 4.7 4.5 4.8 4.6  CL 95* 98 100 98 97*  CO2 34* 33* 32 34* 35*  GLUCOSE 257* 185* 89 257* 289*  BUN 30* 27* 24* 24* 20  CREATININE 1.30* 1.12 0.99 1.10 0.94  CALCIUM 8.5* 8.7* 8.4* 8.4* 8.8*   GFR Estimated Creatinine Clearance: 69.8 mL/min (by C-G formula based on SCr of 0.94 mg/dL). Liver Function  Tests: No results for input(s): AST, ALT, ALKPHOS, BILITOT, PROT, ALBUMIN in the last 168 hours. No results for input(s): LIPASE, AMYLASE in the last 168 hours. No results for input(s): AMMONIA in the last 168 hours. Coagulation profile No results for input(s): INR, PROTIME in the last 168 hours.  CBC: Recent Labs  Lab 08/07/21 2039 08/08/21 0818 08/09/21 0421 08/10/21 0525  WBC 17.8* 17.4* 17.7* 14.1*  NEUTROABS  --   --  14.2* 11.3*  HGB 12.0* 10.9* 10.3* 10.0*  HCT 36.0* 32.7* 30.9* 29.1*  MCV 83.9 83.2 85.6 83.6  PLT 399 388 376 352   Cardiac Enzymes: No results for input(s): CKTOTAL, CKMB, CKMBINDEX, TROPONINI in the last 168 hours. BNP (last 3 results) No results for input(s): PROBNP in the last 8760 hours. CBG: Recent Labs  Lab 08/09/21 1107 08/09/21 1612 08/09/21 1916 08/09/21 2117 08/10/21 0739  GLUCAP 189* 335* 232* 281* 270*   D-Dimer: No results for input(s): DDIMER in the last 72 hours. Hgb A1c: Recent Labs    08/08/21 0818  HGBA1C 9.7*   Lipid Profile: No results for input(s): CHOL, HDL, LDLCALC, TRIG, CHOLHDL, LDLDIRECT in the last 72 hours. Thyroid function studies: No results for input(s): TSH, T4TOTAL, T3FREE, THYROIDAB in the last 72 hours.  Invalid input(s): FREET3 Anemia work up: No results for input(s): VITAMINB12, FOLATE, FERRITIN, TIBC, IRON, RETICCTPCT in the last 72 hours. Sepsis Labs: Recent Labs  Lab 08/07/21 2039 08/08/21 0818 08/09/21 0421 08/10/21 0525  WBC 17.8* 17.4* 17.7* 14.1*    Microbiology Recent Results (from the past 240 hour(s))  Resp Panel by RT-PCR (Flu A&B, Covid) Nasopharyngeal Swab     Status: None    Collection Time: 08/07/21  9:04 PM   Specimen: Nasopharyngeal Swab; Nasopharyngeal(NP) swabs in vial transport medium  Result Value Ref Range Status   SARS Coronavirus 2 by RT PCR NEGATIVE NEGATIVE Final    Comment: (NOTE) SARS-CoV-2 target nucleic acids are NOT DETECTED.  The SARS-CoV-2 RNA is generally detectable in upper respiratory specimens during the acute phase of infection. The lowest concentration of SARS-CoV-2 viral copies this assay can detect is 138 copies/mL. A negative result does not preclude SARS-Cov-2 infection and should not be used as the sole basis for treatment or other patient management decisions. A negative result may occur with  improper specimen collection/handling, submission of specimen other than nasopharyngeal swab, presence of viral mutation(s) within the areas targeted by this assay, and inadequate number of viral copies(<138 copies/mL). A negative result must be combined with clinical observations, patient history, and epidemiological information. The expected result is Negative.  Fact Sheet for Patients:  EntrepreneurPulse.com.au  Fact Sheet for Healthcare Providers:  IncredibleEmployment.be  This test is no t yet approved or cleared by the [place] FDA and  has been authorized for detection and/or diagnosis of SARS-CoV-2 by FDA under an Emergency Use Authorization (EUA). This EUA will remain  in effect (meaning this test can be used) for the duration of the COVID-19 declaration under Section 564(b)(1) of the Act, 21 U.S.C.section 360bbb-3(b)(1), unless the authorization is terminated  or revoked sooner.       Influenza A by PCR NEGATIVE NEGATIVE Final   Influenza B by PCR NEGATIVE NEGATIVE Final    Comment: (NOTE) The Xpert Xpress SARS-CoV-2/FLU/RSV plus assay is intended as an aid in the diagnosis of influenza from Nasopharyngeal swab specimens and should not be used as a sole basis for treatment.  Nasal washings and aspirates are unacceptable for Xpert Xpress SARS-CoV-2/FLU/RSV  testing.  Fact Sheet for Patients: EntrepreneurPulse.com.au  Fact Sheet for Healthcare Providers: IncredibleEmployment.be  This test is not yet approved or cleared by the [place] FDA and has been authorized for detection and/or diagnosis of SARS-CoV-2 by FDA under an Emergency Use Authorization (EUA). This EUA will remain in effect (meaning this test can be used) for the duration of the COVID-19 declaration under Section 564(b)(1) of the Act, 21 U.S.C. section 360bbb-3(b)(1), unless the authorization is terminated or revoked.  Performed at [facility], [address] [place], [place] [address]   MRSA Next Gen by PCR, Nasal     Status: None   Collection Time: 08/08/21  2:55 PM   Specimen: Nasal Mucosa; Nasal Swab  Result Value Ref Range Status   MRSA by PCR Next Gen NOT DETECTED NOT DETECTED Final    Comment: (NOTE) The GeneXpert MRSA Assay (FDA approved for NASAL specimens only), is one component of a comprehensive MRSA colonization surveillance program. It is not intended to diagnose MRSA infection nor to guide or monitor treatment for MRSA infections. Test performance is not FDA approved in patients less than 62 years old. Performed at [facility], [address] [place], [place] [address]     Procedures and diagnostic studies:  No results found.             LOS: 3 days   Swara Donze  [facility]   Pager on [email or web address]. If 7PM-7AM, please contact night-coverage at [email or web address]     08/10/2021, 10:55 AM

## 2021-08-10 NOTE — Progress Notes (Signed)
Patient was very argumentative. States he does not want a treatment (using profanity), states all he wants is food. Treatment was not given at this time

## 2021-08-10 NOTE — Progress Notes (Signed)
PT Cancellation Note  Patient Details Name: Justin Weeks MRN: [id number] DOB: [birth year]   Cancelled Treatment:    Reason Eval/Treat Not Completed: Other (comment) New PT orders received. Per chart pt was ambulatory without AD 2 days ago & PT signed off. PT to complete duplicate orders. MD made aware.   Aleda Grana, PT, DPT 08/10/21, 1:46 PM    Sandi Mariscal 08/10/2021, 1:46 PM

## 2021-08-11 LAB — GLUCOSE, CAPILLARY: Glucose-Capillary: 298 mg/dL — ABNORMAL HIGH (ref 70–99)

## 2021-08-11 MED ORDER — PREDNISONE 20 MG PO TABS
20.0000 mg | ORAL_TABLET | Freq: Every day | ORAL | Status: DC
  Administered 2021-08-11: 08:00:00 20 mg via ORAL
  Filled 2021-08-11: qty 1

## 2021-08-11 MED ORDER — AMOXICILLIN-POT CLAVULANATE 875-125 MG PO TABS: 1.0000 | ORAL_TABLET | Freq: Two times a day (BID) | ORAL | 0 refills | Status: DC

## 2021-08-11 NOTE — Discharge Summary (Signed)
Physician Discharge Summary  Justin Weeks [id number] DOB: 1960/09/29 DOA: 08/07/2021  PCP: Merryl Hacker, No  Admit date: 08/07/2021 Discharge date: 08/11/2021  Discharge disposition: Home   Recommendations for Outpatient Follow-Up:   Follow-up with PCP in 1 week   Discharge Diagnosis:   Principal Problem:   Severe sepsis ([place]) Active Problems:   Diabetes ([place])   Hypothyroidism   Schizoaffective disorder ([place])   Chronic kidney disease, stage II (mild)   Tobacco dependence syndrome   Essential (primary) hypertension   SOB (shortness of breath)   Hyperglycemia   Atypical pneumonia   Protein-calorie malnutrition, severe    Discharge Condition: Stable.  Diet recommendation:  Diet Order             Diet - low sodium heart healthy           Diet Carb Modified           Diet heart healthy/carb modified Room service appropriate? Yes; Fluid consistency: Thin  Diet effective now                     Code Status: DNR     Hospital Course:   Mr. Justin Weeks is a 60 y.o. male with medical history significant for type 2 diabetes mellitus, hypertension, hypothyroidism, arthritis, asthma, seizure disorder, schizoaffective disorder, CKD stage II, tobacco use disorder, who presented to the hospital because of cough and shortness of breath.  His oxygen saturation was 77% on room air in the emergency room.  He was admitted to the hospital for severe sepsis secondary to community-acquired pneumonia/atypical pneumonia complicated by acute hypoxemic respiratory failure.  He also had severe hyperglycemia, AKI and hyperglycemia-induced hyponatremia.  He was treated with empiric IV antibiotics, oxygen via nasal cannula (up to 10 L/min via HFNC) IV fluids and IV insulin infusion.  He had hyperkalemia that was also treated.  His condition has improved and he is tolerating room air.  He is deemed stable for discharge to home today.       Discharge Exam:    Vitals:   08/10/21  2107 08/11/21 0042 08/11/21 0435 08/11/21 0752  BP: (!) 146/83 (!) 150/62 139/77 124/68  Pulse: 80 71 81 97  Resp: 18 18 16 17   Temp: 98.4 F (36.9 C) 98 F (36.7 C) (!) 97.3 F (36.3 C)   TempSrc: Oral Oral Oral   SpO2: 95% 93% 92% 96%  Weight:      Height:         GEN: NAD SKIN: Warm and dry EYES: EOMI ENT: MMM CV: RRR PULM: Air entry adequate bilaterally.  Occasional wheezing but no rales heard ABD: soft, ND, NT, +BS CNS: AAO x 3, non focal EXT: No edema or tenderness   The results of significant diagnostics from this hospitalization (including imaging, microbiology, ancillary and laboratory) are listed below for reference.     Procedures and Diagnostic Studies:   DG Chest 1 View  Result Date: 08/07/2021 CLINICAL DATA:  Cough and shortness of breath for several days, initial encounter EXAM: CHEST  1 VIEW COMPARISON:  08/29/2016 CT FINDINGS: Cardiac shadow is within normal limits. Mild interstitial changes are noted with mild interstitial edema. No focal infiltrate or sizable effusion is noted. No acute bony abnormality is noted. IMPRESSION: Mild pulmonary edema without focal infiltrate. Electronically Signed   By: Inez Catalina M.D.   On: 08/07/2021 21:03   CT Angio Chest Pulmonary Embolism (PE) W or WO Contrast  Result Date: 08/07/2021  CLINICAL DATA:  Cough short of breath EXAM: CT ANGIOGRAPHY CHEST WITH CONTRAST TECHNIQUE: Multidetector CT imaging of the chest was performed using the standard protocol during bolus administration of intravenous contrast. Multiplanar CT image reconstructions and MIPs were obtained to evaluate the vascular anatomy. CONTRAST:  77mL OMNIPAQUE IOHEXOL 350 MG/ML SOLN COMPARISON:  Chest x-ray 08/07/2021, CT chest 08/29/2016 FINDINGS: Cardiovascular: Satisfactory opacification of the pulmonary arteries to the segmental level. No evidence of pulmonary embolism. Normal heart size. No pericardial effusion. Nonaneurysmal aorta. No dissection is seen.  Mediastinum/Nodes: No enlarged mediastinal, hilar, or axillary lymph nodes. Thyroid gland, trachea, and esophagus demonstrate no significant findings. Lungs/Pleura: No pleural effusion. Stable 6 mm right apical lung nodule, consistent with benign finding. Bronchial wall thickening with fairly widespread ill-defined centrilobular micro nodules. More confluent areas of ground-glass density in the left upper lobe and right lower lobe, and patchy consolidation at the right middle lobe. Upper Abdomen: No acute abnormality. Musculoskeletal: No chest wall abnormality. No acute or significant osseous findings. Review of the MIP images confirms the above findings. IMPRESSION: 1. Negative for acute pulmonary embolus or aortic dissection. 2. Mild diffuse bronchial wall thickening with fairly widespread ill-defined centrilobular micro nodules and areas of ground-glass density, suggestive of respiratory bronchiolitis. Other consideration could include atypical pneumonia to include mycobacterial etiologies. 3. Stable 6 mm right apical pulmonary nodule, felt benign given lack of interval change. Electronically Signed   By: Jasmine Pang M.D.   On: 08/07/2021 23:49     Labs:   Basic Metabolic Panel: Recent Labs  Lab 08/09/21 0001 08/09/21 0421 08/09/21 0751 08/09/21 1206 08/10/21 0525  NA 134* 137 137 135 138  K 4.7 4.7 4.5 4.8 4.6  CL 95* 98 100 98 97*  CO2 34* 33* 32 34* 35*  GLUCOSE 257* 185* 89 257* 289*  BUN 30* 27* 24* 24* 20  CREATININE 1.30* 1.12 0.99 1.10 0.94  CALCIUM 8.5* 8.7* 8.4* 8.4* 8.8*   GFR Estimated Creatinine Clearance: 69.8 mL/min (by C-G formula based on SCr of 0.94 mg/dL). Liver Function Tests: No results for input(s): AST, ALT, ALKPHOS, BILITOT, PROT, ALBUMIN in the last 168 hours. No results for input(s): LIPASE, AMYLASE in the last 168 hours. No results for input(s): AMMONIA in the last 168 hours. Coagulation profile No results for input(s): INR, PROTIME in the last 168  hours.  CBC: Recent Labs  Lab 08/07/21 2039 08/08/21 0818 08/09/21 0421 08/10/21 0525  WBC 17.8* 17.4* 17.7* 14.1*  NEUTROABS  --   --  14.2* 11.3*  HGB 12.0* 10.9* 10.3* 10.0*  HCT 36.0* 32.7* 30.9* 29.1*  MCV 83.9 83.2 85.6 83.6  PLT 399 388 376 352   Cardiac Enzymes: No results for input(s): CKTOTAL, CKMB, CKMBINDEX, TROPONINI in the last 168 hours. BNP: Invalid input(s): POCBNP CBG: Recent Labs  Lab 08/10/21 0739 08/10/21 1108 08/10/21 1639 08/10/21 2047 08/11/21 0751  GLUCAP 270* 250* 353* 198* 298*   D-Dimer No results for input(s): DDIMER in the last 72 hours. Hgb A1c No results for input(s): HGBA1C in the last 72 hours. Lipid Profile No results for input(s): CHOL, HDL, LDLCALC, TRIG, CHOLHDL, LDLDIRECT in the last 72 hours. Thyroid function studies No results for input(s): TSH, T4TOTAL, T3FREE, THYROIDAB in the last 72 hours.  Invalid input(s): FREET3 Anemia work up No results for input(s): VITAMINB12, FOLATE, FERRITIN, TIBC, IRON, RETICCTPCT in the last 72 hours. Microbiology Recent Results (from the past 240 hour(s))  Resp Panel by RT-PCR (Flu A&B, Covid) Nasopharyngeal Swab  Status: None   Collection Time: 08/07/21  9:04 PM   Specimen: Nasopharyngeal Swab; Nasopharyngeal(NP) swabs in vial transport medium  Result Value Ref Range Status   SARS Coronavirus 2 by RT PCR NEGATIVE NEGATIVE Final    Comment: (NOTE) SARS-CoV-2 target nucleic acids are NOT DETECTED.  The SARS-CoV-2 RNA is generally detectable in upper respiratory specimens during the acute phase of infection. The lowest concentration of SARS-CoV-2 viral copies this assay can detect is 138 copies/mL. A negative result does not preclude SARS-Cov-2 infection and should not be used as the sole basis for treatment or other patient management decisions. A negative result may occur with  improper specimen collection/handling, submission of specimen other than nasopharyngeal swab, presence of  viral mutation(s) within the areas targeted by this assay, and inadequate number of viral copies(<138 copies/mL). A negative result must be combined with clinical observations, patient history, and epidemiological information. The expected result is Negative.  Fact Sheet for Patients:  EntrepreneurPulse.com.au  Fact Sheet for Healthcare Providers:  IncredibleEmployment.be  This test is no t yet approved or cleared by the [place] FDA and  has been authorized for detection and/or diagnosis of SARS-CoV-2 by FDA under an Emergency Use Authorization (EUA). This EUA will remain  in effect (meaning this test can be used) for the duration of the COVID-19 declaration under Section 564(b)(1) of the Act, 21 U.S.C.section 360bbb-3(b)(1), unless the authorization is terminated  or revoked sooner.       Influenza A by PCR NEGATIVE NEGATIVE Final   Influenza B by PCR NEGATIVE NEGATIVE Final    Comment: (NOTE) The Xpert Xpress SARS-CoV-2/FLU/RSV plus assay is intended as an aid in the diagnosis of influenza from Nasopharyngeal swab specimens and should not be used as a sole basis for treatment. Nasal washings and aspirates are unacceptable for Xpert Xpress SARS-CoV-2/FLU/RSV testing.  Fact Sheet for Patients: EntrepreneurPulse.com.au  Fact Sheet for Healthcare Providers: IncredibleEmployment.be  This test is not yet approved or cleared by the [place] FDA and has been authorized for detection and/or diagnosis of SARS-CoV-2 by FDA under an Emergency Use Authorization (EUA). This EUA will remain in effect (meaning this test can be used) for the duration of the COVID-19 declaration under Section 564(b)(1) of the Act, 21 U.S.C. section 360bbb-3(b)(1), unless the authorization is terminated or revoked.  Performed at [facility], [address] [place], [place] [address]   MRSA Next Gen by PCR,  Nasal     Status: None   Collection Time: 08/08/21  2:55 PM   Specimen: Nasal Mucosa; Nasal Swab  Result Value Ref Range Status   MRSA by PCR Next Gen NOT DETECTED NOT DETECTED Final    Comment: (NOTE) The GeneXpert MRSA Assay (FDA approved for NASAL specimens only), is one component of a comprehensive MRSA colonization surveillance program. It is not intended to diagnose MRSA infection nor to guide or monitor treatment for MRSA infections. Test performance is not FDA approved in patients less than 33 years old. Performed at [facility], [address] [place], [place] [address]      Discharge Instructions:   Discharge Instructions     Diet - low sodium heart healthy   Complete by: As directed    Diet Carb Modified   Complete by: As directed    Increase activity slowly   Complete by: As directed       Allergies as of 08/11/2021       Reactions   Haldol [haloperidol] Other (See Comments)  Reaction:  Agitation  Pt states that he also experiences lockjaw from this medication.          Medication List     STOP taking these medications    albuterol 108 (90 Base) MCG/ACT inhaler Commonly known as: VENTOLIN HFA   clonazePAM 0.5 MG tablet Commonly known as: KLONOPIN   paliperidone 156 MG/ML Susy injection Commonly known as: INVEGA SUSTENNA       TAKE these medications    amoxicillin-clavulanate 875-125 MG tablet Commonly known as: Augmentin Take 1 tablet by mouth 2 (two) times daily for 3 days.   buPROPion 100 MG tablet Commonly known as: WELLBUTRIN Take 100 mg by mouth in the morning.   divalproex 500 MG 24 hr tablet Commonly known as: DEPAKOTE ER Take 1 tablet (500 mg total) by mouth every morning.   divalproex 250 MG 24 hr tablet Commonly known as: DEPAKOTE ER Take 3 tablets (750 mg total) by mouth at bedtime.   donepezil 10 MG tablet Commonly known as: ARICEPT Take 1 tablet (10 mg total) by mouth daily.   glipiZIDE 10 MG 24 hr  tablet Commonly known as: GLUCOTROL XL Take 1 tablet (10 mg total) by mouth daily with breakfast.   levothyroxine 50 MCG tablet Commonly known as: SYNTHROID Take 1 tablet (50 mcg total) by mouth daily at 6 (six) AM.   metformin 1000 MG (OSM) 24 hr tablet Commonly known as: FORTAMET Take 1 tablet (1,000 mg total) by mouth 2 (two) times daily with a meal.   paliperidone 6 MG 24 hr tablet Commonly known as: INVEGA Take 2 tablets (12 mg total) by mouth daily.   simvastatin 20 MG tablet Commonly known as: ZOCOR Take 1 tablet (20 mg total) by mouth at bedtime.   sitaGLIPtin 100 MG tablet Commonly known as: JANUVIA Take 1 tablet (100 mg total) by mouth daily.           If you experience worsening of your admission symptoms, develop shortness of breath, life threatening emergency, suicidal or homicidal thoughts you must seek medical attention immediately by calling 911 or calling your MD immediately  if symptoms less severe.   You must read complete instructions/literature along with all the possible adverse reactions/side effects for all the medicines you take and that have been prescribed to you. Take any new medicines after you have completely understood and accept all the possible adverse reactions/side effects.    Please note   You were cared for by a hospitalist during your hospital stay. If you have any questions about your discharge medications or the care you received while you were in the hospital after you are discharged, you can call the unit and asked to speak with the hospitalist on call if the hospitalist that took care of you is not available. Once you are discharged, your primary care physician will handle any further medical issues. Please note that NO REFILLS for any discharge medications will be authorized once you are discharged, as it is imperative that you return to your primary care physician (or establish a relationship with a primary care physician if you do not  have one) for your aftercare needs so that they can reassess your need for medications and monitor your lab values.       Time coordinating discharge: 33 minutes  Signed:  Lin Glazier  [facility] 08/11/2021, 4:31 PM   Pager on [email or web address]. If 7PM-7AM, please contact night-coverage at [email or web address]

## 2021-08-11 NOTE — TOC Transition Note (Addendum)
Transition of Care [facility]) - CM/SW Discharge Note   Patient Details  Name: Justin Weeks MRN: [id number] Date of Birth: 1960-11-07  Transition of Care [facility]) CM/SW Contact:  [name], LCSW Phone Number: 08/11/2021, 12:16 PM   Clinical Narrative:    Met with patient at bedside. Patient requested we call [name] at [phone]. Spoke with [name] on speaker phone with patient and RN. [name] works at [employer] who follows patient. [name] is working on getting patient a new apartment and will meet with him at his apartment tomorrow morning. Patient requesting a ride home to his apartment. Per [name] there is noone to pick him up since its a weekend. [place] ride arranged, pick up 12:29, RN aware. [name] requested a call from RN, [employer]. No additional TOC needs.   Final next level of care: Home/Self Care Barriers to Discharge: Barriers Resolved   Patient Goals and CMS Choice Patient states their goals for this hospitalization and ongoing recovery are:: to return home CMS Medicare.gov Compare Post Acute Care list provided to:: Patient Choice offered to / list presented to : Patient  Discharge Placement                Patient to be transferred to facility by: [place] Name of family member notified: Patient Patient and family notified of of transfer: 08/11/21  Discharge Plan and Services   Discharge Planning Services: CM Consult, Follow-up appt scheduled            DME Arranged: N/A DME Agency: NA       HH Arranged: NA          Social Determinants of Health (SDOH) Interventions     Readmission Risk Interventions Readmission Risk Prevention Plan 08/09/2021  Transportation Screening Complete  PCP or Specialist Appt within 3-5 Days Complete  HRI or [place] Complete  Social Work Consult for [place] Planning/Counseling Complete  Palliative Care Screening Not Applicable  Medication Review Press photographer) Complete  Some recent data  might be hidden

## 2021-08-11 NOTE — Plan of Care (Signed)
  Problem: Education: Goal: Knowledge of General Education information will improve Description: Including pain rating scale, medication(s)/side effects and non-pharmacologic comfort measures Outcome: Adequate for Discharge   Problem: Health Behavior/Discharge Planning: Goal: Ability to manage health-related needs will improve Outcome: Adequate for Discharge   Problem: Clinical Measurements: Goal: Ability to maintain clinical measurements within normal limits will improve Outcome: Adequate for Discharge Goal: Will remain free from infection Outcome: Adequate for Discharge Goal: Diagnostic test results will improve Outcome: Adequate for Discharge Goal: Respiratory complications will improve Outcome: Adequate for Discharge Goal: Cardiovascular complication will be avoided Outcome: Adequate for Discharge   Problem: Activity: Goal: Risk for activity intolerance will decrease Outcome: Adequate for Discharge   Problem: Nutrition: Goal: Adequate nutrition will be maintained Outcome: Adequate for Discharge   Problem: Coping: Goal: Level of anxiety will decrease Outcome: Adequate for Discharge   Problem: Elimination: Goal: Will not experience complications related to bowel motility Outcome: Adequate for Discharge Goal: Will not experience complications related to urinary retention Outcome: Adequate for Discharge   Problem: Pain Managment: Goal: General experience of comfort will improve Outcome: Adequate for Discharge   Problem: Safety: Goal: Ability to remain free from injury will improve Outcome: Adequate for Discharge   Problem: Skin Integrity: Goal: Risk for impaired skin integrity will decrease Outcome: Adequate for Discharge   Problem: Metabolic: Goal: Ability to maintain appropriate glucose levels will improve Outcome: Adequate for Discharge   Problem: Nutritional: Goal: Maintenance of adequate nutrition will improve Outcome: Adequate for Discharge Goal:  Progress toward achieving an optimal weight will improve Outcome: Adequate for Discharge   Problem: Respiratory: Goal: Ability to maintain adequate ventilation will improve Outcome: Adequate for Discharge Goal: Ability to maintain a clear airway will improve Outcome: Adequate for Discharge

## 2021-08-13 ENCOUNTER — Encounter: Payer: Self-pay | Admitting: Emergency Medicine

## 2021-08-13 ENCOUNTER — Other Ambulatory Visit: Payer: Self-pay

## 2021-08-13 ENCOUNTER — Emergency Department: Payer: Medicare Other

## 2021-08-13 ENCOUNTER — Emergency Department
Admission: EM | Admit: 2021-08-13 | Discharge: 2021-08-14 | Disposition: A | Discharge status: Home / Self Care | Payer: Medicare Other | Attending: Emergency Medicine | Admitting: Emergency Medicine

## 2021-08-13 DIAGNOSIS — F17210 Nicotine dependence, cigarettes, uncomplicated: Secondary | ICD-10-CM | POA: Insufficient documentation

## 2021-08-13 DIAGNOSIS — E1122 Type 2 diabetes mellitus with diabetic chronic kidney disease: Secondary | ICD-10-CM | POA: Insufficient documentation

## 2021-08-13 DIAGNOSIS — I129 Hypertensive chronic kidney disease with stage 1 through stage 4 chronic kidney disease, or unspecified chronic kidney disease: Secondary | ICD-10-CM | POA: Diagnosis not present

## 2021-08-13 DIAGNOSIS — R7989 Other specified abnormal findings of blood chemistry: Secondary | ICD-10-CM

## 2021-08-13 DIAGNOSIS — Z79899 Other long term (current) drug therapy: Secondary | ICD-10-CM | POA: Insufficient documentation

## 2021-08-13 DIAGNOSIS — Z207 Contact with and (suspected) exposure to pediculosis, acariasis and other infestations: Secondary | ICD-10-CM | POA: Diagnosis not present

## 2021-08-13 DIAGNOSIS — J45909 Unspecified asthma, uncomplicated: Secondary | ICD-10-CM | POA: Diagnosis not present

## 2021-08-13 DIAGNOSIS — R791 Abnormal coagulation profile: Secondary | ICD-10-CM | POA: Insufficient documentation

## 2021-08-13 DIAGNOSIS — N182 Chronic kidney disease, stage 2 (mild): Secondary | ICD-10-CM | POA: Diagnosis not present

## 2021-08-13 DIAGNOSIS — E039 Hypothyroidism, unspecified: Secondary | ICD-10-CM | POA: Insufficient documentation

## 2021-08-13 DIAGNOSIS — B888 Other specified infestations: Secondary | ICD-10-CM

## 2021-08-13 DIAGNOSIS — R0602 Shortness of breath: Secondary | ICD-10-CM | POA: Diagnosis not present

## 2021-08-13 LAB — CBC
HCT: 37.3 % — ABNORMAL LOW (ref 39.0–52.0)
Hemoglobin: 12.5 g/dL — ABNORMAL LOW (ref 13.0–17.0)
MCH: 28.2 pg (ref 26.0–34.0)
MCHC: 33.5 g/dL (ref 30.0–36.0)
MCV: 84.2 fL (ref 80.0–100.0)
Platelets: 368 10*3/uL (ref 150–400)
RBC: 4.43 MIL/uL (ref 4.22–5.81)
RDW: 14.6 % (ref 11.5–15.5)
WBC: 7.2 10*3/uL (ref 4.0–10.5)
nRBC: 0 % (ref 0.0–0.2)

## 2021-08-13 LAB — BASIC METABOLIC PANEL WITH GFR
Anion gap: 4 — ABNORMAL LOW (ref 5–15)
BUN: 12 mg/dL (ref 8–23)
CO2: 34 mmol/L — ABNORMAL HIGH (ref 22–32)
Calcium: 9.2 mg/dL (ref 8.9–10.3)
Chloride: 92 mmol/L — ABNORMAL LOW (ref 98–111)
Creatinine, Ser: 1 mg/dL (ref 0.61–1.24)
GFR, Estimated: >60 mL/min
Glucose, Bld: 362 mg/dL — ABNORMAL HIGH (ref 70–99)
Potassium: 5.6 mmol/L — ABNORMAL HIGH (ref 3.5–5.1)
Sodium: 130 mmol/L — ABNORMAL LOW (ref 135–145)

## 2021-08-13 LAB — VALPROIC ACID LEVEL: Valproic Acid Lvl: 56 ug/mL (ref 50.0–100.0)

## 2021-08-13 LAB — PROCALCITONIN: Procalcitonin: <0.1 ng/mL

## 2021-08-13 LAB — D-DIMER, QUANTITATIVE: D-Dimer, Quant: 0.75 ug/mL-FEU — ABNORMAL HIGH (ref 0.00–0.50)

## 2021-08-13 LAB — TROPONIN I (HIGH SENSITIVITY): Troponin I (High Sensitivity): 5 ng/L

## 2021-08-13 MED ORDER — AMOXICILLIN-POT CLAVULANATE 875-125 MG PO TABS
1.0000 | ORAL_TABLET | Freq: Two times a day (BID) | ORAL | Status: DC
  Administered 2021-08-14: 1 via ORAL
  Filled 2021-08-13: qty 1

## 2021-08-13 NOTE — ED Notes (Signed)
This RN and Beth, EDT in room to have patient undress, wipe down with cleansing wipes and get dressed into new gown and socks.  Patient belongings double bagged, patient placed onto new bed and transferred to room 40.

## 2021-08-13 NOTE — Discharge Instructions (Addendum)
In case you did not take the antibiotics at hospital provided I have written another 3 days prescription and some Protonix for some esophagitis. Return to ER for fevers or any other concerns.  IMPRESSION: 1. No acute chest CTA findings. 2. Bilateral bronchitis with multifocal pneumonitis/bronchopneumonia, including with centrilobular micronodularity. There is no new or worsening infiltrate. Increased posterior atelectasis. 3. Increased fluid and food distension of the stomach with increasing retained or refluxed fluid in the esophagus. Consider aspiration precautions. There is increased esophageal thickening most likely due to esophagitis.

## 2021-08-13 NOTE — ED Notes (Signed)
Upon entering room lights off and patient fully under blankets.  When this RN introduced self patient agitatedly states "what do you want".  Attempted to inform patient we are trying to find a way home and call cab but patient begins interrupting this RN to go call cab then and get out of his room.  This RN requesting patient to listen but patient getting more angry and yelling at this RN.  Says to get him home the way he got there, call the cab, call the police.  States he's not walking and he's not going outside.

## 2021-08-13 NOTE — ED Provider Notes (Signed)
[facility] Emergency Department Provider Note  ____________________________________________   Event Date/Time   First MD Initiated Contact with Patient 08/13/21 1448     (approximate)  I have reviewed the triage vital signs and the nursing notes.   HISTORY  Chief Complaint Shortness of Breath   HPI Justin Weeks is a 60 y.o. male  with medical history significant for type 2 diabetes mellitus, hypertension, hypothyroidism, arthritis, asthma, seizure disorder, schizoaffective disorder, CKD stage II, tobacco use disorder, and recent admission 12/7-12/11 for sepsis secondary to community-acquired pneumonia who presents for assessment of shortness of breath.  Patient states he had been feeling better but started feeling worse today.  He denies any fevers, cough, chest pain, headache, earache, sore throat, vomiting, diarrhea, burning with urination, rash or extremity pain.  Denies tobacco abuse.  Denies EtOH or illicit drug use.  States he has been taking all his medications including antibiotics as directed.  No other acute concerns at this time.  Of note did observe bedbugs on patient and patient's dwelling.         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   Diabetes mellitus without complication ([place])    Hypertension    Hypothyroid 07/03/2015   Personal history of tobacco use, presenting hazards to health 05/31/2015   Schizoaffective disorder ([place])    Seizures ([place])     Patient Active Problem List   Diagnosis Date Noted   Protein-calorie malnutrition, severe 08/09/2021   Hyperglycemia 08/08/2021   Severe sepsis ([place]) 08/08/2021   Atypical pneumonia 08/08/2021   SOB (shortness of breath) 08/07/2021   Psychosis ([place]) 02/21/2020   Schizoaffective disorder ([place]) 10/17/2019   Suicidal ideation    Memory difficulty 01/03/2019    Schizoaffective disorder, bipolar type ([place]) 07/04/2015   Dyslipidemia 07/04/2015   Tobacco dependence syndrome 07/04/2015   Noncompliance 07/03/2015   Hypothyroidism 07/03/2015   Diabetes ([employer]) 04/20/2015   Chronic kidney disease, stage II (mild) 09/12/2013   Hemorrhoids 12/03/2007   Hyperlipidemia, unspecified 11/16/2007   Type 2 diabetes mellitus without complications ([place]) [id number]   Essential (primary) hypertension 11/16/2007    History reviewed. No pertinent surgical history.  Prior to Admission medications   Medication Sig Start Date End Date Taking? Authorizing Provider  amoxicillin-clavulanate (AUGMENTIN) 875-125 MG tablet Take 1 tablet by mouth 2 (two) times daily for 3 days. 08/11/21 08/14/21  Jennye Boroughs, MD  buPROPion (WELLBUTRIN) 100 MG tablet Take 100 mg by mouth in the morning.    [provider]  divalproex (DEPAKOTE ER) 250 MG 24 hr tablet Take 3 tablets (750 mg total) by mouth at bedtime. 02/24/20   Clapacs, Madie Reno, MD  divalproex (DEPAKOTE ER) 500 MG 24 hr tablet Take 1 tablet (500 mg total) by mouth every morning. 02/24/20   Clapacs, Madie Reno, MD  donepezil (ARICEPT) 10 MG tablet Take 1 tablet (10 mg total) by mouth daily. 02/24/20   Clapacs, Madie Reno, MD  glipiZIDE (GLUCOTROL XL) 10 MG 24 hr tablet Take 1 tablet (10 mg total) by mouth daily with breakfast. 02/25/20   Clapacs, Madie Reno, MD  levothyroxine (SYNTHROID) 50 MCG tablet Take 1 tablet (50 mcg total) by mouth daily at 6 (six) AM. 02/25/20   Clapacs, Madie Reno, MD  metformin (FORTAMET) 1000 MG (OSM) 24 hr tablet Take 1 tablet (1,000 mg total) by mouth 2 (  two) times daily with a meal. 02/24/20   Clapacs, Madie Reno, MD  paliperidone (INVEGA) 6 MG 24 hr tablet Take 2 tablets (12 mg total) by mouth daily. 06/07/21   Salley Scarlet, MD  simvastatin (ZOCOR) 20 MG tablet Take 1 tablet (20 mg total) by mouth at bedtime. 02/24/20   Clapacs, Madie Reno, MD  sitaGLIPtin (JANUVIA) 100 MG tablet Take 1 tablet (100 mg total) by mouth  daily. 02/24/20   Clapacs, Madie Reno, MD    Allergies Haldol [haloperidol]  No family history on file.  Social History Social History   Tobacco Use   Smoking status: Every Day    Packs/day: 1.00    Years: 40.00    Pack years: 40.00    Types: Cigarettes   Smokeless tobacco: Never  Vaping Use   Vaping Use: Unknown  Substance Use Topics   Alcohol use: No   Drug use: Yes    Types: Cocaine    Review of Systems  Review of Systems  Constitutional:  Negative for chills and fever.  HENT:  Negative for sore throat.   Eyes:  Negative for pain.  Respiratory:  Positive for cough (very mild and improving from discharge) and shortness of breath. Negative for stridor.   Cardiovascular:  Negative for chest pain.  Gastrointestinal:  Negative for vomiting.  Genitourinary:  Negative for dysuria.  Musculoskeletal:  Negative for myalgias.  Skin:  Negative for rash.  Neurological:  Negative for seizures, loss of consciousness and headaches.  Psychiatric/Behavioral:  Negative for suicidal ideas.   All other systems reviewed and are negative.    ____________________________________________   PHYSICAL EXAM:  VITAL SIGNS: ED Triage Vitals [08/13/21 1414]  Enc Vitals Group     BP      Pulse      Resp      Temp      Temp src      SpO2      Weight      Height      Head Circumference      Peak Flow      Pain Score 0     Pain Loc      Pain Edu?      Excl. in [place]?    Vitals:   08/13/21 1615  BP: (!) 108/59  Pulse: 84  Resp: 16  Temp: 97.7 F (36.5 C)  SpO2: 93%   Physical Exam Vitals and nursing note reviewed.  Constitutional:      General: He is not in acute distress.    Appearance: He is well-developed.  HENT:     Head: Normocephalic and atraumatic.     Right Ear: External ear normal.     Left Ear: External ear normal.     Nose: Nose normal.  Eyes:     Conjunctiva/sclera: Conjunctivae normal.  Cardiovascular:     Rate and Rhythm: Normal rate and regular rhythm.      Heart sounds: No murmur heard. Pulmonary:     Effort: Pulmonary effort is normal. No respiratory distress.     Breath sounds: Normal breath sounds.  Abdominal:     Palpations: Abdomen is soft.     Tenderness: There is no abdominal tenderness.  Musculoskeletal:        General: No swelling.     Cervical back: Neck supple.  Skin:    General: Skin is warm and dry.     Capillary Refill: Capillary refill takes less than 2 seconds.  Neurological:  Mental Status: He is alert and oriented to person, place, and time.  Psychiatric:        Mood and Affect: Mood normal.     ____________________________________________   LABS (all labs ordered are listed, but only abnormal results are displayed)  Labs Reviewed  BASIC METABOLIC PANEL - Abnormal; Notable for the following components:      Result Value   Sodium 130 (*)    Potassium 5.6 (*)    Chloride 92 (*)    CO2 34 (*)    Glucose, Bld 362 (*)    Anion gap 4 (*)    All other components within normal limits  CBC - Abnormal; Notable for the following components:   Hemoglobin 12.5 (*)    HCT 37.3 (*)    All other components within normal limits  D-DIMER, QUANTITATIVE - Abnormal; Notable for the following components:   D-Dimer, Quant 0.75 (*)    All other components within normal limits  RESP PANEL BY RT-PCR (FLU A&B, COVID) ARPGX2  PROCALCITONIN  VALPROIC ACID LEVEL  TROPONIN I (HIGH SENSITIVITY)  TROPONIN I (HIGH SENSITIVITY)   ____________________________________________  EKG  Sinus rhythm with a ventricular rate of 92, right axis deviation, unremarkable intervals with nonspecific changes versus artifact in anterior lateral leads. ____________________________________________  RADIOLOGY  ED MD interpretation: Chest x-ray shows bilateral bronchovascular and groundglass opacities consistent with bronchitis or multifocal pneumonia.  There is no overt edema, large effusion, pneumothorax or other acute thoracic  process.  Official radiology report(s): DG Chest Port 1 View  Result Date: 08/13/2021 CLINICAL DATA:  Short of breath EXAM: PORTABLE CHEST 1 VIEW COMPARISON:  08/07/2021 FINDINGS: Two frontal views of the chest demonstrate an unremarkable cardiac silhouette. Diffuse bronchovascular prominence with patchy bilateral ground-glass airspace disease again noted, consistent with the likely bronchitis and multifocal infection seen previously. Slight increase in the consolidation at the right lung base since prior study. No effusion or pneumothorax. No acute bony abnormalities. IMPRESSION: 1. Slight progression of the bilateral bronchovascular and ground-glass opacity seen previously, consistent with underlying bronchitis and multifocal pneumonia. Electronically Signed   By: Randa Ngo M.D.   On: 08/13/2021 15:10    ____________________________________________   PROCEDURES  Procedure(s) performed (including Critical Care):  Procedures   ____________________________________________   INITIAL IMPRESSION / ASSESSMENT AND PLAN / ED COURSE      Patient presents with above-stated history exam for assessment of shortness of breath that he states he felt today in the setting of improving cough pleurisy recently admitted for pneumonia.  On arrival patient was afebrile with SPO2 of 93% on room air and otherwise a stable vital signs.  No significant wheezing or increased work of breathing at this time to suggest an asthma exacerbation.  Additional differential considerations include PE, viral bronchitis, anemia, metabolic derangements, arrhythmia, ACS, and ongoing possibly worsening bacterial pneumonia.  Sinus rhythm with a ventricular rate of 92, right axis deviation, unremarkable intervals with nonspecific changes versus artifact in anterior lateral leads.  Nonelevated troponin obtained greater than 3 hours after symptom onset not suggestive of ACS or myocarditis.  Procalcitonin is undetectable.  CBC  shows no leukocytosis or acute anemia.  BMP remarkable for some pseudohyponatremia with sodium of 130 and a glucose of 362 without evidence of acidosis and some chronic appearing metabolic alkalosis with bicarb 34.  K slightly above baseline at 5.6.  Chest x-ray shows bilateral bronchovascular and groundglass opacities consistent with bronchitis or multifocal pneumonia.  There is no overt edema, large effusion, pneumothorax  or other acute thoracic process.  D-dimer is elevated 0.75 and a CTA obtained to rule out PE.  Prior to obtaining a CTA patient requested to leave.  I explained I was concerned about possibly undiagnosed pulmonary embolism that could be life-threatening and I strongly recommend he stay for this.  He stated he understood this but still wished to leave because he felt he had been waiting too long in the emergency room.  Patient quite angry on my reassessment yelling at this examiner.  Discussed also that his potassium was slightly high and if CT would help determine if he has any additional new processes in his chest to contribute to his shortness of breath.  He stated he understood this but still wished to leave against my advice.  He was able to voice risks of foregoing procedure including death and disability back to this examiner.  I think he had capacity to make this decision and was discharged against my advice.  Advised to continue outpatient antibiotics and all other medications.  Advised to follow-up with PCP or return immediately if he changes his mind.     ____________________________________________   FINAL CLINICAL IMPRESSION(S) / ED DIAGNOSES  Final diagnoses:  SOB (shortness of breath)  Positive D dimer  Infestation by bed bug    Medications - No data to display   ED Discharge Orders     None        Note:  This document was prepared using Dragon voice recognition software and may include unintentional dictation errors.    Gilles Chiquito,  MD 08/13/21 [phone]

## 2021-08-13 NOTE — ED Triage Notes (Signed)
Pt brought in by [facility] with [facility] was just discharge a few days ago with pneumonia. Per [facility] his O2 was 96% on room air, no labored breathing. No other complaints. Pt does have bed bugs per EMS

## 2021-08-13 NOTE — ED Provider Notes (Signed)
----------------------------------------- °  10:07 PM on 08/13/2021 -----------------------------------------  Blood pressure (!) 108/59, pulse 84, temperature 97.7 F (36.5 C), temperature source Oral, resp. rate 16, SpO2 93 %.   In short, Justin Weeks is a 60 y.o. male with a chief complaint of Shortness of Breath .  Refer to the original H&P for additional details.  Patient was discharged AMA as patient refused to comply with further testing.  Patient did have elevated D-dimer, and was presenting for shortness of breath.  Patient became extremely hostile to the previous provider, nursing staff and after refusing any further care, patient was discharged AMA.  Patient did not have a ride at the time, he was allowed to remain in his room as he is infested with bedbugs.  Patient has been disinterested in pursuing any further work-up and till we advised the patient that we would have to discharge him.  Patient wanted some food and states that at this point he would pursue the CT scan.  As I wanted to talk to the patient about this decision he became very hostile again and refused the CT scan.  I advised him that at this point we would discharge him as the section would be closing and we do not have a place for him to stay.  At time patient stated that he would pursue imaging.  Patient has been very verbally abusive to myself, previous provider, staff members.  Patient has threatened physical harm against me if I attempted to discharge him.  Patient states that he came here and he plans on staying.  Advised him that the emergency department was not a home and that if there was not a medical reason that we would discharge him.  Patient states that if we attempted discharge home at that point he would states state that he would try to kill himself or others and at that point "you will still have to keep me."  However in the next breath, patient states that he is afraid of becoming more ill, and if I do not  provide every medical care for him he will "whip your [my] ass". Patient has full mental faculties as he is able to reason through reasons that he does not want to go home, reasons not to have his CT scan.  Patient is able to verbalize the risks of leaving AGAINST MEDICAL ADVICE. He is able to make decisions for himself at this time.  Patient is aware what is going on, has verbalized thought processes show that he is competent to refuse further work-up.  At this time if patient becomes hostile again, refuses further work-up, he will be discharged and escorted from the premises by law enforcement.  Patient has AMA 3 times though he has not left the property between each of his AMA discharges.  He is currently awaiting CT scan.  I have discussed events to this point with attending provider, Dr. Alfred Levins.  If CT scan is reassuring no evidence of PE patient will be discharged.  Nursing staff, security are aware of this patient and the risk for violence at time of discharge.  Current plan is that when patient is able to be discharged, law enforcement will be present to ensure that patient does not become a danger to staff.   Lanette Hampshire 08/13/21 2347    Concha Se, MD 08/14/21 [phone]

## 2021-08-13 NOTE — ED Notes (Signed)
[name], [phone] (personal cell number)

## 2021-08-13 NOTE — ED Notes (Signed)
Liborio Nixon spoke to patient on the phone and then spoke to this RN.  States in their conversation patient is willing to stay but wanted to leave because he was hungry and thirsty and no one has fed him.  Consulting civil engineer and provider informed.

## 2021-08-13 NOTE — ED Notes (Signed)
Patient given [place] sandwich tray and cola at this time per request.  When asked after receiving food if patient would stay for the CT scan patient said he would, states he would be fine having and IV placed again, being cleaned up, and going through with the treatment process.  This RN stated to patient that he could not yell at staff anymore and argue and patient agrees.

## 2021-08-14 ENCOUNTER — Emergency Department: Payer: Medicare Other

## 2021-08-14 ENCOUNTER — Encounter: Payer: Self-pay | Admitting: Radiology

## 2021-08-14 DIAGNOSIS — R0602 Shortness of breath: Secondary | ICD-10-CM | POA: Diagnosis not present

## 2021-08-14 MED ORDER — IOHEXOL 350 MG/ML SOLN
75.0000 mL | Freq: Once | INTRAVENOUS | Status: AC | PRN
  Administered 2021-08-14: 75 mL via INTRAVENOUS
  Filled 2021-08-14: qty 75

## 2021-08-14 MED ORDER — PANTOPRAZOLE SODIUM 20 MG PO TBEC: 20.0000 mg | DELAYED_RELEASE_TABLET | Freq: Every day | ORAL | 0 refills | Status: AC

## 2021-08-14 MED ORDER — AMOXICILLIN-POT CLAVULANATE 875-125 MG PO TABS: 1.0000 | ORAL_TABLET | Freq: Two times a day (BID) | ORAL | 0 refills | Status: AC

## 2021-08-14 NOTE — ED Provider Notes (Signed)
1:02 AM Assumed care for off going team.   Blood pressure (!) 108/59, pulse 84, temperature 97.7 F (36.5 C), temperature source Oral, resp. rate 16, SpO2 93 %.  See their HPI for full report but in brief pending CT imaging.    1. No acute chest CTA findings.  2. Bilateral bronchitis with multifocal  pneumonitis/bronchopneumonia, including with centrilobular  micronodularity. There is no new or worsening infiltrate. Increased  posterior atelectasis.  3. Increased fluid and food distension of the stomach with  increasing retained or refluxed fluid in the esophagus. Consider  aspiration precautions. There is increased esophageal thickening  most likely due to esophagitis.    Patient was prescribed 3 additional doses of Augmentin after discharge.  Unclear if patient is actually been taking this.  He states that he does not have the antibiotics at home so we will give an additional 3 days antibiotics.  Also discussed with him the concern for reflux fluid in the esophagus and considering aspiration precautions and started him on a PPI for possible esophagitis.  Initially patient was resistant to being discharged but then was able to be convinced by nurse.  Taxi is were not running to provide taxicab.  The [place] police were able to transport him back home   Concha Se, MD 08/14/21 [phone]

## 2021-08-14 NOTE — ED Notes (Signed)
[employer] arrived to transport patient home. Pt refused to sign discharge. Discharge paperwork reviewed with patient who verbalized understanding.

## 2021-08-14 NOTE — ED Notes (Signed)
Pt given disposable blue scrubs to wear. Pt agrees to plan to go home and possibility of going home with law enforcement.

## 2022-11-05 ENCOUNTER — Emergency Department
Admission: EM | Admit: 2022-11-05 | Discharge: 2022-11-05 | Disposition: A | Discharge status: Home / Self Care | Payer: 59 | Attending: Emergency Medicine | Admitting: Emergency Medicine

## 2022-11-05 ENCOUNTER — Emergency Department: Payer: 59

## 2022-11-05 ENCOUNTER — Other Ambulatory Visit: Payer: Self-pay

## 2022-11-05 DIAGNOSIS — K5900 Constipation, unspecified: Secondary | ICD-10-CM | POA: Insufficient documentation

## 2022-11-05 MED ORDER — LACTULOSE 10 GM/15ML PO SOLN
30.0000 g | Freq: Once | ORAL | Status: AC
  Administered 2022-11-05: 30 g via ORAL
  Filled 2022-11-05: qty 60

## 2022-11-05 NOTE — ED Notes (Signed)
Pt discharge to home. Pt VSS, GCS 15, NAD. Pt verbalized understanding of discharge instructions with no additional questions at this time.

## 2022-11-05 NOTE — ED Triage Notes (Signed)
Pt to ED with friend for constipation. LBM 1 week ago. Pt became agitated in lobby before triage because asked if ED would call cab to get him home and this RN told him probably not and we do not know yet. Security was called to come assist with communication with pt. Pt calmed down then. Pt appears intoxicated but denies alcohol and drug use.

## 2022-11-05 NOTE — Discharge Instructions (Signed)
Drink prune juice for constipation Also can use MIRALAX, buy the generic brand, use 1 capful daily Take 1 container of the medication we are sending home with you, if it doesn't work then take a second one

## 2022-11-05 NOTE — ED Provider Notes (Signed)
[facility] Provider Note    Event Date/Time   First MD Initiated Contact with Patient 11/05/22 1320     (approximate)   History   Constipation   HPI  Justin Weeks is a 62 y.o. male presents emergency department complaining of constipation.  The patient states that he has felt constipated for several days.  States he will sit on toilet feels like he has to go but has not been able to go.  No vomiting.  No diarrhea.  No fever or chills.  Last bowel movement was 2 days ago      Physical Exam   Triage Vital Signs: ED Triage Vitals  Enc Vitals Group     BP 11/05/22 1223 (!) 131/95     Pulse Rate 11/05/22 1223 95     Resp 11/05/22 1223 15     Temp 11/05/22 1223 97.8 F (36.6 C)     Temp Source 11/05/22 1223 Oral     SpO2 11/05/22 1223 97 %     Weight 11/05/22 1224 150 lb (68 kg)     Height 11/05/22 1224 '5\' 8"'$  (1.727 m)     Head Circumference --      Peak Flow --      Pain Score 11/05/22 1224 0     Pain Loc --      Pain Edu? --      Excl. in [place]? --     Most recent vital signs: Vitals:   11/05/22 1223  BP: (!) 131/95  Pulse: 95  Resp: 15  Temp: 97.8 F (36.6 C)  SpO2: 97%     General: Awake, no distress.   CV:  Good peripheral perfusion. regular rate and  rhythm Resp:  Normal effort.  Abd:  No distention.  Nontender, bowel sounds present all 4 quadrants Other:      ED Results / Procedures / Treatments   Labs (all labs ordered are listed, but only abnormal results are displayed) Labs Reviewed - No data to display   EKG     RADIOLOGY Abdomen 1 view    PROCEDURES:   Procedures   MEDICATIONS ORDERED IN ED: Medications  lactulose (CHRONULAC) 10 GM/15ML solution 30 g (has no administration in time range)     IMPRESSION / MDM / ASSESSMENT AND PLAN / ED COURSE  I reviewed the triage vital signs and the nursing notes.                              Differential diagnosis includes, but is not limited to,  constipation, bowel obstruction, volvulus  Patient's presentation is most consistent with acute complicated illness / injury requiring diagnostic workup.   X-ray of the abdomen 1 view was independently reviewed and interpreted by me as having moderate amount of stool.  Confirmed by radiology.  No stool ball is noted.  No volvulus or bowel obstruction  I did explain these findings to the patient.  He is already tried stool softener at home.  Will send lactulose home with him.  Explained to him he needs to stay home when he takes his medication as it will give him very loose stools.  Patient is in agreement treatment plan.  He was discharged stable condition.      FINAL CLINICAL IMPRESSION(S) / ED DIAGNOSES   Final diagnoses:  Constipation, unspecified constipation type     Rx / DC Orders   ED Discharge Orders  None        Note:  This document was prepared using Dragon voice recognition software and may include unintentional dictation errors.    Versie Starks, PA-C 11/05/22 1333    Rada Hay, MD 11/05/22 [phone]

## 2023-10-09 ENCOUNTER — Emergency Department
Admission: EM | Admit: 2023-10-09 | Discharge: 2023-10-09 | Discharge status: Against Medical Advice | Payer: 59 | Attending: Emergency Medicine | Admitting: Emergency Medicine

## 2023-10-09 ENCOUNTER — Other Ambulatory Visit: Payer: Self-pay

## 2023-10-09 DIAGNOSIS — R059 Cough, unspecified: Secondary | ICD-10-CM | POA: Insufficient documentation

## 2023-10-09 DIAGNOSIS — Z5321 Procedure and treatment not carried out due to patient leaving prior to being seen by health care provider: Secondary | ICD-10-CM | POA: Diagnosis not present

## 2023-10-09 DIAGNOSIS — F17210 Nicotine dependence, cigarettes, uncomplicated: Secondary | ICD-10-CM | POA: Diagnosis not present

## 2023-10-09 DIAGNOSIS — Z20822 Contact with and (suspected) exposure to covid-19: Secondary | ICD-10-CM | POA: Insufficient documentation

## 2023-10-09 LAB — RESP PANEL BY RT-PCR (RSV, FLU A&B, COVID)  RVPGX2
Influenza A by PCR: NEGATIVE
Influenza B by PCR: NEGATIVE
Resp Syncytial Virus by PCR: NEGATIVE
SARS Coronavirus 2 by RT PCR: NEGATIVE

## 2023-10-09 NOTE — ED Triage Notes (Signed)
 Pt c/o runny nose, productive cough, scratchy throat since last night, states "I just have a cold." Pt will not answer all of triage questions, states "that's personal, I'm not here for all that." Pt smokes cigarettes. Cough noted. PT AOX4, NAD noted.

## 2023-10-12 ENCOUNTER — Other Ambulatory Visit: Payer: Self-pay

## 2023-10-12 ENCOUNTER — Emergency Department
Admission: EM | Admit: 2023-10-12 | Discharge: 2023-10-12 | Discharge status: Against Medical Advice | Payer: 59 | Attending: Emergency Medicine | Admitting: Emergency Medicine

## 2023-10-12 DIAGNOSIS — Z20822 Contact with and (suspected) exposure to covid-19: Secondary | ICD-10-CM | POA: Insufficient documentation

## 2023-10-12 DIAGNOSIS — Z5321 Procedure and treatment not carried out due to patient leaving prior to being seen by health care provider: Secondary | ICD-10-CM | POA: Insufficient documentation

## 2023-10-12 DIAGNOSIS — R0989 Other specified symptoms and signs involving the circulatory and respiratory systems: Secondary | ICD-10-CM | POA: Diagnosis present

## 2023-10-12 DIAGNOSIS — J101 Influenza due to other identified influenza virus with other respiratory manifestations: Secondary | ICD-10-CM

## 2023-10-12 DIAGNOSIS — J029 Acute pharyngitis, unspecified: Secondary | ICD-10-CM | POA: Diagnosis not present

## 2023-10-12 LAB — RESP PANEL BY RT-PCR (RSV, FLU A&B, COVID)  RVPGX2
Influenza A by PCR: NEGATIVE
Influenza B by PCR: NEGATIVE
Resp Syncytial Virus by PCR: NEGATIVE
SARS Coronavirus 2 by RT PCR: NEGATIVE

## 2023-10-12 LAB — GROUP A STREP BY PCR: Group A Strep by PCR: NOT DETECTED

## 2023-10-12 NOTE — Discharge Instructions (Addendum)
 Please use tylenol 

## 2023-10-12 NOTE — ED Triage Notes (Signed)
 Pt is here today for the same thing that he was here on the 7th for. Pt sts that he left AMA that day and is still having the sore scratchy throat.

## 2024-02-25 ENCOUNTER — Emergency Department
Admission: EM | Admit: 2024-02-25 | Discharge: 2024-02-25 | Disposition: A | Discharge status: Home / Self Care | Attending: Emergency Medicine | Admitting: Emergency Medicine

## 2024-02-25 ENCOUNTER — Emergency Department

## 2024-02-25 ENCOUNTER — Other Ambulatory Visit: Payer: Self-pay

## 2024-02-25 DIAGNOSIS — E1122 Type 2 diabetes mellitus with diabetic chronic kidney disease: Secondary | ICD-10-CM | POA: Diagnosis not present

## 2024-02-25 DIAGNOSIS — N189 Chronic kidney disease, unspecified: Secondary | ICD-10-CM | POA: Insufficient documentation

## 2024-02-25 DIAGNOSIS — R0602 Shortness of breath: Secondary | ICD-10-CM | POA: Diagnosis present

## 2024-02-25 DIAGNOSIS — R0600 Dyspnea, unspecified: Secondary | ICD-10-CM | POA: Diagnosis not present

## 2024-02-25 DIAGNOSIS — I129 Hypertensive chronic kidney disease with stage 1 through stage 4 chronic kidney disease, or unspecified chronic kidney disease: Secondary | ICD-10-CM | POA: Diagnosis not present

## 2024-02-25 DIAGNOSIS — J449 Chronic obstructive pulmonary disease, unspecified: Secondary | ICD-10-CM | POA: Diagnosis not present

## 2024-02-25 DIAGNOSIS — E860 Dehydration: Secondary | ICD-10-CM | POA: Diagnosis not present

## 2024-02-25 LAB — BASIC METABOLIC PANEL WITH GFR
Anion gap: 6 (ref 5–15)
BUN: 29 mg/dL — ABNORMAL HIGH (ref 8–23)
CO2: 28 mmol/L (ref 22–32)
Calcium: 8.9 mg/dL (ref 8.9–10.3)
Chloride: 104 mmol/L (ref 98–111)
Creatinine, Ser: 1.68 mg/dL — ABNORMAL HIGH (ref 0.61–1.24)
GFR, Estimated: 45 mL/min — ABNORMAL LOW (ref >60)
Glucose, Bld: 118 mg/dL — ABNORMAL HIGH (ref 70–99)
Potassium: 4.7 mmol/L (ref 3.5–5.1)
Sodium: 138 mmol/L (ref 135–145)

## 2024-02-25 LAB — CBC
HCT: 42.7 % (ref 39.0–52.0)
Hemoglobin: 14 g/dL (ref 13.0–17.0)
MCH: 28.9 pg (ref 26.0–34.0)
MCHC: 32.8 g/dL (ref 30.0–36.0)
MCV: 88 fL (ref 80.0–100.0)
Platelets: 194 10*3/uL (ref 150–400)
RBC: 4.85 MIL/uL (ref 4.22–5.81)
RDW: 13.8 % (ref 11.5–15.5)
WBC: 3.5 10*3/uL — ABNORMAL LOW (ref 4.0–10.5)
nRBC: 0 % (ref 0.0–0.2)

## 2024-02-25 NOTE — ED Triage Notes (Signed)
 Pt arrives via EMS from home with dizziness since yesterday. Ambulatory.   Hx of COPD and DM.  100% RA, 102/70, CBG 130

## 2024-02-25 NOTE — ED Provider Notes (Signed)
 [facility] Provider Note    Event Date/Time   First MD Initiated Contact with Patient 02/25/24 1130     (approximate)  History   Chief Complaint: Shortness of Breath  HPI  Justin Weeks is a 63 y.o. male with a past medical history of asthma, arthritis, CKD, diabetes, hypertension, schizophrenia, presents to the emergency department for shortness of breath.  Overall patient appears well, no distress, lying in bed, speaking in full sentences.  100% on room air.  Patient states he has been feeling short of breath for a while.  Patient denies any cough congestion denies any chest pain.  Physical Exam   Triage Vital Signs: ED Triage Vitals  Encounter Vitals Group     BP 02/25/24 1037 111/70     Girls Systolic BP Percentile --      Girls Diastolic BP Percentile --      Boys Systolic BP Percentile --      Boys Diastolic BP Percentile --      Pulse Rate 02/25/24 1037 74     Resp 02/25/24 1037 17     Temp 02/25/24 1037 97.8 F (36.6 C)     Temp Source 02/25/24 1037 Oral     SpO2 02/25/24 1037 94 %     Weight 02/25/24 1038 143 lb (64.9 kg)     Height 02/25/24 1038 5' 8 (1.727 m)     Head Circumference --      Peak Flow --      Pain Score 02/25/24 1038 8     Pain Loc --      Pain Education --      Exclude from Growth Chart --     Most recent vital signs: Vitals:   02/25/24 1037  BP: 111/70  Pulse: 74  Resp: 17  Temp: 97.8 F (36.6 C)  SpO2: 94%    General: Awake, no distress.  CV:  Good peripheral perfusion.  Regular rate and rhythm  Resp:  Normal effort.  Equal breath sounds bilaterally.  No wheeze rales or rhonchi Abd:  No distention.  Soft, nontender.  No rebound or guarding.  ED Results / Procedures / Treatments   EKG  EKG viewed and interpreted by myself shows a normal sinus rhythm at 72 bpm with a narrow QRS, normal axis, normal intervals, no concerning ST changes.  RADIOLOGY  I have reviewed interpret the chest x-ray  images.  No consolidation seen on my evaluation.  Radiology has read the x-ray is negative   MEDICATIONS ORDERED IN ED: Medications - No data to display   IMPRESSION / MDM / ASSESSMENT AND PLAN / ED COURSE  I reviewed the triage vital signs and the nursing notes.  Patient's presentation is most consistent with acute presentation with potential threat to life or bodily function.  Patient presents emergency department for shortness of breath.  Patient has a history of COPD also schizophrenia.  Overall the patient appears well with a normal respiratory rate satting in the upper 90s on room air.  No wheeze rales or rhonchi.  Patient's workup shows a reassuring CBC.  Patient does have mild renal insufficiency.  Patient states he has been spending a lot of time outside.  I suspect this is likely dehydration related.  Discussed placing an IV for IV hydration, however the patient is very much against an IV and states he has no problem going home and drinking lots of fluids.  I believe this is a reasonable alternative.  The remainder of the workup is reassuring with a reassuring EKG and chest x-ray with reassuring vitals and labs as well as a reassuring physical exam.  Discussed with the patient to stay indoors lots of fluids and avoid exertion for the next several days.  Patient agreeable to plan.  FINAL CLINICAL IMPRESSION(S) / ED DIAGNOSES   Dyspnea Dehydration  Note:  This document was prepared using Dragon voice recognition software and may include unintentional dictation errors.   Dorothyann Drivers, MD 02/25/24 1140

## 2024-02-25 NOTE — ED Notes (Signed)
 The pt was provided a sandwich tray and a bus pass. ED Nurse Rock called Romero from [employer] in ref to the pt stating he needed food. She advised they would be able to assist the pt with food. A courtesy car was called to assist the pt with getting to the bus stop.

## 2024-02-25 NOTE — Discharge Instructions (Signed)
 As discussed please drink plenty of fluids of the neck several days.  Please avoid heat and exertion.  Please follow-up with your doctor in the next 2 to 3 days for recheck/reevaluation.  Return to the emergency department for any symptom concerning to yourself.  Your workup today has shown overall reassuring results.

## 2024-02-25 NOTE — ED Triage Notes (Signed)
 Pt arrives via [facility] with c/o SOB that started this morning. Pt COPD, DM. Pt is A&Ox4 during triage.
